# Patient Record
Sex: Female | Born: 1940
Health system: Southern US, Community
[De-identification: ages and names within clinical notes are randomized; demographics above are authoritative.]

## PROBLEM LIST (undated history)

## (undated) DIAGNOSIS — H409 Unspecified glaucoma: Secondary | ICD-10-CM

## (undated) DIAGNOSIS — C801 Malignant (primary) neoplasm, unspecified: Secondary | ICD-10-CM

## (undated) DIAGNOSIS — J479 Bronchiectasis, uncomplicated: Secondary | ICD-10-CM

## (undated) HISTORY — PX: BLADDER REPAIR: SHX76

---

## 1998-04-28 ENCOUNTER — Other Ambulatory Visit: Admission: RE | Admit: 1998-04-28 | Discharge: 1998-04-28 | Payer: Self-pay | Admitting: Obstetrics and Gynecology

## 1998-06-30 ENCOUNTER — Ambulatory Visit (HOSPITAL_COMMUNITY): Admission: RE | Admit: 1998-06-30 | Discharge: 1998-06-30 | Payer: Self-pay | Admitting: Obstetrics and Gynecology

## 1998-07-29 ENCOUNTER — Other Ambulatory Visit: Admission: RE | Admit: 1998-07-29 | Discharge: 1998-07-29 | Payer: Self-pay | Admitting: Obstetrics and Gynecology

## 1999-01-27 ENCOUNTER — Other Ambulatory Visit: Admission: RE | Admit: 1999-01-27 | Discharge: 1999-01-27 | Payer: Self-pay | Admitting: Obstetrics and Gynecology

## 1999-03-11 ENCOUNTER — Encounter: Admission: RE | Admit: 1999-03-11 | Discharge: 1999-03-11 | Payer: Self-pay | Admitting: Sports Medicine

## 1999-03-22 ENCOUNTER — Encounter: Admission: RE | Admit: 1999-03-22 | Discharge: 1999-03-22 | Payer: Self-pay | Admitting: Family Medicine

## 1999-04-06 ENCOUNTER — Encounter: Admission: RE | Admit: 1999-04-06 | Discharge: 1999-04-06 | Payer: Self-pay | Admitting: Sports Medicine

## 1999-05-05 ENCOUNTER — Other Ambulatory Visit: Admission: RE | Admit: 1999-05-05 | Discharge: 1999-05-05 | Payer: Self-pay | Admitting: Obstetrics and Gynecology

## 1999-09-27 ENCOUNTER — Observation Stay (HOSPITAL_COMMUNITY): Admission: RE | Admit: 1999-09-27 | Discharge: 1999-09-28 | Payer: Self-pay | Admitting: Urology

## 1999-10-11 ENCOUNTER — Encounter: Payer: Self-pay | Admitting: Obstetrics and Gynecology

## 1999-10-11 ENCOUNTER — Ambulatory Visit (HOSPITAL_COMMUNITY): Admission: RE | Admit: 1999-10-11 | Discharge: 1999-10-11 | Payer: Self-pay | Admitting: Obstetrics and Gynecology

## 1999-11-19 ENCOUNTER — Encounter: Admission: RE | Admit: 1999-11-19 | Discharge: 1999-11-19 | Payer: Self-pay | Admitting: Family Medicine

## 2000-11-10 ENCOUNTER — Ambulatory Visit (HOSPITAL_COMMUNITY): Admission: RE | Admit: 2000-11-10 | Discharge: 2000-11-10 | Payer: Self-pay | Admitting: Obstetrics and Gynecology

## 2000-11-10 ENCOUNTER — Encounter: Payer: Self-pay | Admitting: Obstetrics and Gynecology

## 2001-05-10 ENCOUNTER — Other Ambulatory Visit: Admission: RE | Admit: 2001-05-10 | Discharge: 2001-05-10 | Payer: Self-pay | Admitting: Obstetrics and Gynecology

## 2001-07-16 ENCOUNTER — Encounter: Admission: RE | Admit: 2001-07-16 | Discharge: 2001-07-16 | Payer: Self-pay | Admitting: Family Medicine

## 2001-12-31 ENCOUNTER — Ambulatory Visit (HOSPITAL_COMMUNITY): Admission: RE | Admit: 2001-12-31 | Discharge: 2001-12-31 | Payer: Self-pay | Admitting: Obstetrics and Gynecology

## 2001-12-31 ENCOUNTER — Encounter: Payer: Self-pay | Admitting: Obstetrics and Gynecology

## 2002-05-13 ENCOUNTER — Encounter (INDEPENDENT_AMBULATORY_CARE_PROVIDER_SITE_OTHER): Payer: Self-pay | Admitting: *Deleted

## 2002-05-22 ENCOUNTER — Other Ambulatory Visit: Admission: RE | Admit: 2002-05-22 | Discharge: 2002-05-22 | Payer: Self-pay | Admitting: Obstetrics and Gynecology

## 2002-06-25 ENCOUNTER — Encounter: Admission: RE | Admit: 2002-06-25 | Discharge: 2002-06-25 | Payer: Self-pay | Admitting: Family Medicine

## 2002-11-22 ENCOUNTER — Encounter: Admission: RE | Admit: 2002-11-22 | Discharge: 2002-11-22 | Payer: Self-pay | Admitting: Family Medicine

## 2002-12-24 ENCOUNTER — Encounter: Admission: RE | Admit: 2002-12-24 | Discharge: 2002-12-24 | Payer: Self-pay | Admitting: Sports Medicine

## 2003-02-20 ENCOUNTER — Encounter: Payer: Self-pay | Admitting: Obstetrics and Gynecology

## 2003-02-20 ENCOUNTER — Ambulatory Visit (HOSPITAL_COMMUNITY): Admission: RE | Admit: 2003-02-20 | Discharge: 2003-02-20 | Payer: Self-pay | Admitting: Obstetrics and Gynecology

## 2003-05-09 ENCOUNTER — Encounter: Admission: RE | Admit: 2003-05-09 | Discharge: 2003-05-09 | Payer: Self-pay | Admitting: Family Medicine

## 2003-05-22 ENCOUNTER — Encounter: Admission: RE | Admit: 2003-05-22 | Discharge: 2003-05-22 | Payer: Self-pay | Admitting: Sports Medicine

## 2003-07-17 ENCOUNTER — Other Ambulatory Visit: Admission: RE | Admit: 2003-07-17 | Discharge: 2003-07-17 | Payer: Self-pay | Admitting: Obstetrics and Gynecology

## 2004-01-16 ENCOUNTER — Encounter: Admission: RE | Admit: 2004-01-16 | Discharge: 2004-01-16 | Payer: Self-pay | Admitting: Family Medicine

## 2004-01-22 ENCOUNTER — Encounter: Admission: RE | Admit: 2004-01-22 | Discharge: 2004-01-22 | Payer: Self-pay | Admitting: Sports Medicine

## 2004-02-10 ENCOUNTER — Ambulatory Visit (HOSPITAL_COMMUNITY): Admission: RE | Admit: 2004-02-10 | Discharge: 2004-02-10 | Payer: Self-pay | Admitting: Vascular Surgery

## 2004-02-10 ENCOUNTER — Encounter: Admission: RE | Admit: 2004-02-10 | Discharge: 2004-02-10 | Payer: Self-pay | Admitting: Sports Medicine

## 2004-02-23 ENCOUNTER — Ambulatory Visit (HOSPITAL_COMMUNITY): Admission: RE | Admit: 2004-02-23 | Discharge: 2004-02-23 | Payer: Self-pay | Admitting: Obstetrics and Gynecology

## 2004-04-29 ENCOUNTER — Encounter: Admission: RE | Admit: 2004-04-29 | Discharge: 2004-04-29 | Payer: Self-pay | Admitting: Sports Medicine

## 2004-05-05 ENCOUNTER — Encounter: Admission: RE | Admit: 2004-05-05 | Discharge: 2004-05-13 | Payer: Self-pay | Admitting: Sports Medicine

## 2004-08-25 ENCOUNTER — Encounter
Admission: RE | Admit: 2004-08-25 | Discharge: 2004-09-27 | Payer: Self-pay | Admitting: Physical Medicine & Rehabilitation

## 2005-03-17 ENCOUNTER — Ambulatory Visit: Payer: Self-pay | Admitting: Internal Medicine

## 2005-03-31 ENCOUNTER — Ambulatory Visit (HOSPITAL_COMMUNITY): Admission: RE | Admit: 2005-03-31 | Discharge: 2005-03-31 | Payer: Self-pay | Admitting: Sports Medicine

## 2005-04-04 ENCOUNTER — Ambulatory Visit: Payer: Self-pay | Admitting: Internal Medicine

## 2005-07-11 ENCOUNTER — Ambulatory Visit: Payer: Self-pay | Admitting: Sports Medicine

## 2005-07-15 ENCOUNTER — Ambulatory Visit: Payer: Self-pay | Admitting: Family Medicine

## 2006-03-20 ENCOUNTER — Encounter: Payer: Self-pay | Admitting: Sports Medicine

## 2006-04-05 ENCOUNTER — Ambulatory Visit (HOSPITAL_COMMUNITY): Admission: RE | Admit: 2006-04-05 | Discharge: 2006-04-05 | Payer: Self-pay | Admitting: Obstetrics and Gynecology

## 2006-07-11 ENCOUNTER — Ambulatory Visit: Payer: Self-pay | Admitting: Sports Medicine

## 2006-07-13 ENCOUNTER — Ambulatory Visit: Payer: Self-pay | Admitting: Internal Medicine

## 2006-08-24 ENCOUNTER — Ambulatory Visit: Payer: Self-pay | Admitting: Internal Medicine

## 2006-11-09 DIAGNOSIS — M26609 Unspecified temporomandibular joint disorder, unspecified side: Secondary | ICD-10-CM | POA: Insufficient documentation

## 2006-11-09 DIAGNOSIS — M5430 Sciatica, unspecified side: Secondary | ICD-10-CM

## 2006-11-10 ENCOUNTER — Encounter (INDEPENDENT_AMBULATORY_CARE_PROVIDER_SITE_OTHER): Payer: Self-pay | Admitting: *Deleted

## 2006-11-27 ENCOUNTER — Ambulatory Visit: Payer: Self-pay | Admitting: Internal Medicine

## 2007-07-24 ENCOUNTER — Encounter: Payer: Self-pay | Admitting: Sports Medicine

## 2007-07-24 ENCOUNTER — Ambulatory Visit: Payer: Self-pay | Admitting: Family Medicine

## 2007-07-27 LAB — CONVERTED CEMR LAB
Albumin: 4.3 g/dL (ref 3.5–5.2)
BUN: 11 mg/dL (ref 6–23)
CO2: 24 meq/L (ref 19–32)
HCT: 42.4 % (ref 36.0–46.0)
LDL Cholesterol: 98 mg/dL (ref 0–99)
MCV: 93.4 fL (ref 78.0–100.0)
Platelets: 234 10*3/uL (ref 150–400)
Potassium: 4.5 meq/L (ref 3.5–5.3)
RBC: 4.54 M/uL (ref 3.87–5.11)
RDW: 13.2 % (ref 11.5–15.5)
TSH: 0.933 microintl units/mL (ref 0.350–5.50)
Total CHOL/HDL Ratio: 3
WBC: 5.7 10*3/uL (ref 4.0–10.5)

## 2007-08-08 ENCOUNTER — Encounter: Payer: Self-pay | Admitting: Sports Medicine

## 2007-08-17 ENCOUNTER — Ambulatory Visit: Payer: Self-pay | Admitting: Internal Medicine

## 2007-08-17 ENCOUNTER — Ambulatory Visit: Payer: Self-pay | Admitting: Pulmonary Disease

## 2007-08-21 ENCOUNTER — Telehealth (INDEPENDENT_AMBULATORY_CARE_PROVIDER_SITE_OTHER): Payer: Self-pay | Admitting: *Deleted

## 2007-08-30 ENCOUNTER — Ambulatory Visit: Payer: Self-pay | Admitting: Sports Medicine

## 2007-08-30 DIAGNOSIS — J301 Allergic rhinitis due to pollen: Secondary | ICD-10-CM | POA: Insufficient documentation

## 2007-08-30 DIAGNOSIS — H402290 Chronic angle-closure glaucoma, unspecified eye, stage unspecified: Secondary | ICD-10-CM | POA: Insufficient documentation

## 2007-08-30 DIAGNOSIS — J189 Pneumonia, unspecified organism: Secondary | ICD-10-CM | POA: Insufficient documentation

## 2007-09-03 ENCOUNTER — Telehealth: Payer: Self-pay | Admitting: *Deleted

## 2007-09-03 ENCOUNTER — Ambulatory Visit: Payer: Self-pay | Admitting: Sports Medicine

## 2007-09-11 ENCOUNTER — Ambulatory Visit (HOSPITAL_COMMUNITY): Admission: RE | Admit: 2007-09-11 | Discharge: 2007-09-11 | Payer: Self-pay | Admitting: Sports Medicine

## 2007-09-13 HISTORY — PX: BREAST LUMPECTOMY: SHX2

## 2007-09-26 ENCOUNTER — Encounter: Admission: RE | Admit: 2007-09-26 | Discharge: 2007-09-26 | Payer: Self-pay | Admitting: Sports Medicine

## 2007-09-26 ENCOUNTER — Encounter (INDEPENDENT_AMBULATORY_CARE_PROVIDER_SITE_OTHER): Payer: Self-pay | Admitting: Diagnostic Radiology

## 2007-09-27 ENCOUNTER — Telehealth: Payer: Self-pay | Admitting: Sports Medicine

## 2008-03-19 ENCOUNTER — Encounter: Payer: Self-pay | Admitting: Sports Medicine

## 2008-07-01 ENCOUNTER — Encounter: Payer: Self-pay | Admitting: Sports Medicine

## 2008-09-30 ENCOUNTER — Encounter: Payer: Self-pay | Admitting: Family Medicine

## 2009-03-31 ENCOUNTER — Encounter: Payer: Self-pay | Admitting: Family Medicine

## 2009-07-01 ENCOUNTER — Encounter: Payer: Self-pay | Admitting: Family Medicine

## 2010-10-03 ENCOUNTER — Encounter: Payer: Self-pay | Admitting: Sports Medicine

## 2010-11-03 ENCOUNTER — Encounter: Payer: Self-pay | Admitting: *Deleted

## 2011-01-25 NOTE — Assessment & Plan Note (Signed)
[facility]                             PULMONARY OFFICE NOTE   NAME:TWISELTONMirella, [name] Rice                     MRN:          [id number]  DATE:08/17/2007                            DOB:          28-Mar-1941    HISTORY OF PRESENT ILLNESS:  This is a 70 year old white female patient  of Dr. Gustavus Bryant who has previously been seen for recurrent chest  infections in the past.  The patient reports she had been doing  exceptionally well up until the last month.  The patient had recently  been on a cruise and had the norovirus.  The patient states symptoms did  resolve.  However, she left for [place] the week before Thanksgiving  and developed significant cough, congestion with thick green mucous,  severe fever, and rigors.  The patient treated herself with some Tylenol  and aspirin products.  On return on August 08, 2007, she saw Dr.  Donneta Romberg.  She was given a course of Levaquin, prednisone Dosepak, started  on Symbicort, and nasal spray.  The patient reports that symptoms have  improved.  However, she continues to have some postnasal drip symptoms  and feels extremely fatigued.  The patient reports her cough is  substantially improved.  She denies any fever, purulent sputum,  hemoptysis, orthopnea, PND, chest pain, calf pain or swelling or edema.   PAST MEDICAL HISTORY:  Reviewed.   CURRENT MEDICATIONS:  Reviewed.   PHYSICAL EXAMINATION:  GENERAL:  The patient is a pleasant female in no  acute distress.  VITAL SIGNS:  She is afebrile with stable vital signs.  O2 saturation is  97% on room air.  HEENT:  Nasal mucosa is slightly pale.  Nontender sinuses.  Posterior  pharynx is clear.  NECK:  Supple without cervical adenopathy.  No JVD.  LUNGS:  Lung sounds are clear to auscultation bilaterally without any  wheezing or crackles.  CARDIOVASCULAR:  S1 and S2 without murmur or gallop.  ABDOMEN:  Soft and nontender.  No palpable hepatosplenomegaly.  EXTREMITIES:   Warm without any clubbing, cyanosis, or edema.  Negative  Homans sign.  SKIN:  Warm without rash.   IMPRESSION AND PLAN:  Recent tracheobronchitis versus possible  pneumonia.  What the patient describes is severe bronchitic symptoms.  The patient has now finished a course of Levaquin which would have  covered a possible underlying pneumonia.  She is to continue on  Symbicort as recommended.  If cough returns, she may use Mucinex DM  twice daily.  I have recommended that she use some saline nasal spray  and continue on Allegra daily.  Chest x-ray is pending at time of  discharge.  We will followup accordingly.  The patient is to follow back  up  with Dr. Melvyn Novas as scheduled or sooner if needed.  The patient is  recommended to increase her fluid intake and Tylenol as needed.      Rexene Edison, NP  Electronically Signed      [name], MD, [facility]  Electronically Signed   TP/MedQ  DD: 08/17/2007  DT: 08/17/2007  Job #: [id number]

## 2011-01-28 NOTE — Assessment & Plan Note (Signed)
[facility]                             PULMONARY OFFICE NOTE   NAME:TWISELTONMakendra, Vigeant                       MRN:          [id number]  DATE:11/27/2006                            DOB:          09/15/1940    HISTORY:  A 70 year old white female who does not know why she is  here. The records indicate she has a history of recurrent chest  infections several times a year with evidence of multiple pulmonary  nodules on CT scan initially suggesting the possibility of atypical  mycobacterial infection. She has had no symptoms at all since her  previous visit, specifically denying any significant dyspnea or cough or  chest pain, fevers, chills, sweats, unintended weight loss.   On physical examination, she is pleasant ambulatory white female with a  delightful [place] accent, no acute distress. She is afebrile, stable  vital signs.  HEENT: Unremarkable. Pharynx clear.  NECK: Supple without cervical adenopathy, tenderness, trachea is  midline, no thyromegaly.  LUNG FIELDS: Completely clear bilaterally to auscultation and  percussion.  CARDIOVASCULAR:  Regular rate and rhythm without murmur, gallop or rub.  ABDOMEN: Soft, benign.  EXTREMITIES: Warm without calf tenderness, cyanosis, clubbing, or edema.   Chest x-ray shows no recurrent nodular changes. PFTs show minum airflow  obstruction, with an FEV 1 of 112%, no improvement after bronchodilators  in a normal diffusing capacity.   IMPRESSION:  No evidence of significant pulmonary disease at present. I  have not solved the puzzle of why she gets so many chest infections  but note the absence of any definite evidence of bronchiectasis or for  that matter significant structural lung disease (other than the nodular  changes in the right mid and upper lung zone see on plan film as well as  chest CT scan on March 20, 2006 which apparently resolved and mostly  likely were inflammatory therefore benign in  nature).   Pulmonary follow up in this setting can be p.r.n.     [name], MD, [facility]  Electronically Signed    MBW/MedQ  DD: 11/27/2006  DT: 11/27/2006  Job #: [id number]   cc:   Wolfgang Phoenix. Oneida Alar, M.D.  Tiajuana Amass, MD

## 2011-01-28 NOTE — Assessment & Plan Note (Signed)
 [facility]                             PULMONARY OFFICE NOTE   NAME:TWISELTONMarijke, [name] Rice                     MRN:          [id number]  DATE:08/24/2006                            DOB:          1941/08/29    HISTORY:  The patient is a 70 year old white female seen at Dr. Seward Meth  request for evaluation of vague right upper lobe airspace changes with  nodularity documented in July 2007 by chest x-ray and CT scan.  She  denies any ongoing symptoms presently on taking only Allegra with no  cough, fever, chills, sweats, chest pain. She does notice mild dyspnea  with exertion over the last 18 months with slightly worsening baseline  but otherwise feels fine.   PHYSICAL EXAMINATION:  GENERAL:  She is an ambulatory, pleasant white  female in no acute distress.  VITAL SIGNS:  She is afebrile with normal vital signs. Weight unchanged  at 148.  HEENT:  Unremarkable.  Oropharynx is clear.  CHEST:  Lungs are perfectly clear bilaterally to auscultation and  percussion.  HEART:  Regular rhythm without murmur, gallop or rub.  ABDOMEN:  Soft, benign.  EXTREMITIES:  Warm without calf tenderness, cyanosis or clubbing or  edema.   LABORATORY DATA:  Chest x-ray today reveals no definite abnormalities in  the right upper lobe where previously on plain films she had definite  nodular densities.   IMPRESSION:  Complete resolution of nodular infiltrates which were most  likely inflammatory and, therefore, not likely represent atypical TB,  although this was a concern, nor significant eosinophilic lung disease.   I am concerned about the complaints of chronic dyspnea on exertion and  recurrent pneumonia and, therefore, recommend followup care in three  months with a set of chest x-rays for apples to apples comparison to  previous studies and also a set of PFTs for baseline purposes, but I do  not believe any pulmonary intervention is necessary at this point.     [name], MD, [facility]  Electronically Signed    MBW/MedQ  DD: 08/24/2006  DT: 08/24/2006  Job #: [id number]   cc:   Dr. Gilman Schmidt B. Oneida Alar, M.D.

## 2011-01-28 NOTE — Assessment & Plan Note (Signed)
[facility]                               PULMONARY OFFICE NOTE   NAME:TWISELTONIrisha, Grandmaison                     MRN:          [id number]  DATE:07/13/2006                            DOB:          1941/06/26    PULMONARY CONSULTATION   REASON FOR CONSULTATION:  Abnormal CT scan.   HISTORY:  A 70 year old white female with a history of recurrent pattern of  chest infections for the last 3-4 years typically requiring an antibiotic  and resolving within a few days.  She had developed a persistent cough and  was evaluated by Dr. Orvil Feil with positive allergy testing but a chest x-ray  was done indicating that she had pulmonary nodules in the right upper lobe  and was therefore seen here at Dr. Seward Meth request.   In the meantime, the patient has no symptoms at all.  Specifically, she  denies any significant cough, chest pain, fevers, chills, sweats, or dyspnea  on her present regimen consisting of Pulmicort which she has reduced on her  own down to one puff daily and fexofenadine one daily.  She denies any  pleuritic pain and enjoys normal activities.   PAST MEDICAL HISTORY:  Significant for a diagnosis of mild asthma and  seasonal rhinitis as noted above.   MEDICATION ALLERGIES:  None.   MEDICATIONS:  Include fexofenadine, calcium, Xalatan, and Pulmicort.   SOCIAL HISTORY:  She smoked only as a teenager occasionally.  She denies any  unusual travel, pet, occupational, or hobby exposure.   FAMILY HISTORY:  Is recorded in detail and significant for allergies in her  mother and granddaughter.   REVIEW OF SYSTEMS:  Taken in detail on the work sheet and significant for  the problems outlined above.   PHYSICAL EXAMINATION:  GENERAL:  This is a very pleasant ambulatory white  female in no acute distress.  She had normal vital signs.  HEENT:  Reveals minimal turbinate edema.  No pallor, polyps, or cyanosis.  Oropharynx is clear.  NECK:  Supple without  cervical adenopathy or tenderness.  Trachea is  midline.  LUNG FIELDS:  Perfectly clear bilaterally to auscultation and percussion.  HEART:  Regular rhythm without murmur, gallop or rub.  ABDOMEN:  Soft, benign.  EXTREMITIES:  Warm without calf tenderness, cyanosis, clubbing, edema.   Chest x-ray was reviewed from March 20, 2006, with no prior films available  (apparently there are some x-rays at [facility] that she may have forgotten to  tell [facility] about) and also a CT scan of the chest dated March 23, 2006, showing vague peripheral nodular opacities predominantly in the right  mid lung zone.   IMPRESSION:  Multiple pulmonary nodules in a patient who has a recurrent  pattern of pneumonia suggests the possibility of MAI infection but  certainly are not consistent with malignancy or MTB.  Although I asked the  question several different ways, I was not actually able to ascertain if the  patient has any chronic illness at all, but she insists this is an issue of  not any chronic respiratory complaints but intermittent symptoms maybe  once  or twice a year maybe for 1 or 2 weeks.  Therefore, it may well be that  the x-rays have nothing to do with the symptoms (although I note by Dr.  Seward Meth original evaluation the concern was a chronic cough which the  patient now denies).   To get a better feel for this, therefore, I recommended the patient return  here in 6 weeks for a chest x-ray and supply at that time any old x-rays she  can find for apples-to-apples comparison and pay more attention to  respiratory symptoms in the meantime to see whether or not we can put  together the x-ray with the symptom complex and justify an intervention such  as transbronchial biopsy.    ______________________________  Christena Deem Melvyn Novas, MD, [facility]    MBW/MedQ  DD: 07/13/2006  DT: 07/13/2006  Job #: [id number]   cc:   Tiajuana Amass, M.D.  Wolfgang Phoenix. Oneida Alar, M.D.

## 2011-10-11 DIAGNOSIS — H409 Unspecified glaucoma: Secondary | ICD-10-CM | POA: Diagnosis not present

## 2011-10-11 DIAGNOSIS — H4011X0 Primary open-angle glaucoma, stage unspecified: Secondary | ICD-10-CM | POA: Diagnosis not present

## 2011-11-18 DIAGNOSIS — Z09 Encounter for follow-up examination after completed treatment for conditions other than malignant neoplasm: Secondary | ICD-10-CM | POA: Diagnosis not present

## 2011-11-18 DIAGNOSIS — Z853 Personal history of malignant neoplasm of breast: Secondary | ICD-10-CM | POA: Diagnosis not present

## 2011-11-18 DIAGNOSIS — C50919 Malignant neoplasm of unspecified site of unspecified female breast: Secondary | ICD-10-CM | POA: Diagnosis not present

## 2011-11-18 DIAGNOSIS — Z9889 Other specified postprocedural states: Secondary | ICD-10-CM | POA: Diagnosis not present

## 2011-11-30 DIAGNOSIS — R87610 Atypical squamous cells of undetermined significance on cytologic smear of cervix (ASC-US): Secondary | ICD-10-CM | POA: Diagnosis not present

## 2011-11-30 DIAGNOSIS — Z124 Encounter for screening for malignant neoplasm of cervix: Secondary | ICD-10-CM | POA: Diagnosis not present

## 2012-01-03 DIAGNOSIS — H4011X0 Primary open-angle glaucoma, stage unspecified: Secondary | ICD-10-CM | POA: Diagnosis not present

## 2012-01-03 DIAGNOSIS — H409 Unspecified glaucoma: Secondary | ICD-10-CM | POA: Diagnosis not present

## 2012-04-03 DIAGNOSIS — H4011X0 Primary open-angle glaucoma, stage unspecified: Secondary | ICD-10-CM | POA: Diagnosis not present

## 2012-04-03 DIAGNOSIS — H269 Unspecified cataract: Secondary | ICD-10-CM | POA: Diagnosis not present

## 2012-04-03 DIAGNOSIS — H409 Unspecified glaucoma: Secondary | ICD-10-CM | POA: Diagnosis not present

## 2012-04-13 DIAGNOSIS — C50919 Malignant neoplasm of unspecified site of unspecified female breast: Secondary | ICD-10-CM | POA: Diagnosis not present

## 2012-06-27 DIAGNOSIS — Z23 Encounter for immunization: Secondary | ICD-10-CM | POA: Diagnosis not present

## 2012-07-10 DIAGNOSIS — H409 Unspecified glaucoma: Secondary | ICD-10-CM | POA: Diagnosis not present

## 2012-07-10 DIAGNOSIS — H4011X0 Primary open-angle glaucoma, stage unspecified: Secondary | ICD-10-CM | POA: Diagnosis not present

## 2012-07-10 DIAGNOSIS — H259 Unspecified age-related cataract: Secondary | ICD-10-CM | POA: Diagnosis not present

## 2012-07-24 DIAGNOSIS — Z136 Encounter for screening for cardiovascular disorders: Secondary | ICD-10-CM | POA: Diagnosis not present

## 2012-07-24 DIAGNOSIS — Z1331 Encounter for screening for depression: Secondary | ICD-10-CM | POA: Diagnosis not present

## 2012-07-24 DIAGNOSIS — Z131 Encounter for screening for diabetes mellitus: Secondary | ICD-10-CM | POA: Diagnosis not present

## 2012-07-24 DIAGNOSIS — Z0000 Encounter for general adult medical examination without abnormal findings: Secondary | ICD-10-CM | POA: Diagnosis not present

## 2012-10-09 DIAGNOSIS — H4011X0 Primary open-angle glaucoma, stage unspecified: Secondary | ICD-10-CM | POA: Diagnosis not present

## 2012-10-09 DIAGNOSIS — H409 Unspecified glaucoma: Secondary | ICD-10-CM | POA: Diagnosis not present

## 2012-10-09 DIAGNOSIS — H259 Unspecified age-related cataract: Secondary | ICD-10-CM | POA: Diagnosis not present

## 2012-11-22 DIAGNOSIS — C50919 Malignant neoplasm of unspecified site of unspecified female breast: Secondary | ICD-10-CM | POA: Diagnosis not present

## 2013-01-01 DIAGNOSIS — H259 Unspecified age-related cataract: Secondary | ICD-10-CM | POA: Diagnosis not present

## 2013-01-01 DIAGNOSIS — H4011X0 Primary open-angle glaucoma, stage unspecified: Secondary | ICD-10-CM | POA: Diagnosis not present

## 2013-01-01 DIAGNOSIS — H409 Unspecified glaucoma: Secondary | ICD-10-CM | POA: Diagnosis not present

## 2013-01-20 DIAGNOSIS — J479 Bronchiectasis, uncomplicated: Secondary | ICD-10-CM | POA: Diagnosis not present

## 2013-04-02 DIAGNOSIS — H409 Unspecified glaucoma: Secondary | ICD-10-CM | POA: Diagnosis not present

## 2013-04-02 DIAGNOSIS — H4011X0 Primary open-angle glaucoma, stage unspecified: Secondary | ICD-10-CM | POA: Diagnosis not present

## 2013-04-02 DIAGNOSIS — H259 Unspecified age-related cataract: Secondary | ICD-10-CM | POA: Diagnosis not present

## 2013-04-19 DIAGNOSIS — C50919 Malignant neoplasm of unspecified site of unspecified female breast: Secondary | ICD-10-CM | POA: Diagnosis not present

## 2013-05-01 DIAGNOSIS — R059 Cough, unspecified: Secondary | ICD-10-CM | POA: Diagnosis not present

## 2013-05-01 DIAGNOSIS — C50919 Malignant neoplasm of unspecified site of unspecified female breast: Secondary | ICD-10-CM | POA: Diagnosis not present

## 2013-05-01 DIAGNOSIS — R042 Hemoptysis: Secondary | ICD-10-CM | POA: Diagnosis not present

## 2013-05-01 DIAGNOSIS — R05 Cough: Secondary | ICD-10-CM | POA: Diagnosis not present

## 2013-05-01 DIAGNOSIS — J479 Bronchiectasis, uncomplicated: Secondary | ICD-10-CM | POA: Diagnosis not present

## 2013-07-01 DIAGNOSIS — Z23 Encounter for immunization: Secondary | ICD-10-CM | POA: Diagnosis not present

## 2013-07-16 DIAGNOSIS — H259 Unspecified age-related cataract: Secondary | ICD-10-CM | POA: Diagnosis not present

## 2013-07-16 DIAGNOSIS — H4011X0 Primary open-angle glaucoma, stage unspecified: Secondary | ICD-10-CM | POA: Diagnosis not present

## 2013-07-16 DIAGNOSIS — H409 Unspecified glaucoma: Secondary | ICD-10-CM | POA: Diagnosis not present

## 2013-07-29 DIAGNOSIS — R042 Hemoptysis: Secondary | ICD-10-CM | POA: Diagnosis not present

## 2013-07-29 DIAGNOSIS — Z1331 Encounter for screening for depression: Secondary | ICD-10-CM | POA: Diagnosis not present

## 2013-08-07 DIAGNOSIS — L723 Sebaceous cyst: Secondary | ICD-10-CM | POA: Diagnosis not present

## 2013-08-28 DIAGNOSIS — M19049 Primary osteoarthritis, unspecified hand: Secondary | ICD-10-CM | POA: Diagnosis not present

## 2013-08-28 DIAGNOSIS — L723 Sebaceous cyst: Secondary | ICD-10-CM | POA: Diagnosis not present

## 2013-08-28 DIAGNOSIS — M6740 Ganglion, unspecified site: Secondary | ICD-10-CM | POA: Diagnosis not present

## 2013-08-30 DIAGNOSIS — Z01818 Encounter for other preprocedural examination: Secondary | ICD-10-CM | POA: Diagnosis not present

## 2013-08-30 DIAGNOSIS — J479 Bronchiectasis, uncomplicated: Secondary | ICD-10-CM | POA: Diagnosis not present

## 2013-09-16 DIAGNOSIS — Z853 Personal history of malignant neoplasm of breast: Secondary | ICD-10-CM | POA: Diagnosis not present

## 2013-09-16 DIAGNOSIS — M6740 Ganglion, unspecified site: Secondary | ICD-10-CM | POA: Diagnosis not present

## 2013-10-01 DIAGNOSIS — H269 Unspecified cataract: Secondary | ICD-10-CM | POA: Diagnosis not present

## 2013-10-01 DIAGNOSIS — H4011X0 Primary open-angle glaucoma, stage unspecified: Secondary | ICD-10-CM | POA: Diagnosis not present

## 2013-10-01 DIAGNOSIS — H409 Unspecified glaucoma: Secondary | ICD-10-CM | POA: Diagnosis not present

## 2013-12-17 DIAGNOSIS — H4011X0 Primary open-angle glaucoma, stage unspecified: Secondary | ICD-10-CM | POA: Diagnosis not present

## 2013-12-17 DIAGNOSIS — H409 Unspecified glaucoma: Secondary | ICD-10-CM | POA: Diagnosis not present

## 2013-12-25 DIAGNOSIS — Z9889 Other specified postprocedural states: Secondary | ICD-10-CM | POA: Diagnosis not present

## 2013-12-25 DIAGNOSIS — C50919 Malignant neoplasm of unspecified site of unspecified female breast: Secondary | ICD-10-CM | POA: Diagnosis not present

## 2013-12-25 DIAGNOSIS — Z853 Personal history of malignant neoplasm of breast: Secondary | ICD-10-CM | POA: Diagnosis not present

## 2014-01-02 DIAGNOSIS — J479 Bronchiectasis, uncomplicated: Secondary | ICD-10-CM | POA: Diagnosis not present

## 2014-02-18 DIAGNOSIS — H4011X0 Primary open-angle glaucoma, stage unspecified: Secondary | ICD-10-CM | POA: Diagnosis not present

## 2014-02-18 DIAGNOSIS — H409 Unspecified glaucoma: Secondary | ICD-10-CM | POA: Diagnosis not present

## 2014-02-18 DIAGNOSIS — H269 Unspecified cataract: Secondary | ICD-10-CM | POA: Diagnosis not present

## 2014-05-08 DIAGNOSIS — Z124 Encounter for screening for malignant neoplasm of cervix: Secondary | ICD-10-CM | POA: Diagnosis not present

## 2014-07-01 DIAGNOSIS — Z23 Encounter for immunization: Secondary | ICD-10-CM | POA: Diagnosis not present

## 2014-07-22 DIAGNOSIS — H4011X2 Primary open-angle glaucoma, moderate stage: Secondary | ICD-10-CM | POA: Diagnosis not present

## 2014-07-22 DIAGNOSIS — H4011X3 Primary open-angle glaucoma, severe stage: Secondary | ICD-10-CM | POA: Diagnosis not present

## 2014-08-11 DIAGNOSIS — R300 Dysuria: Secondary | ICD-10-CM | POA: Diagnosis not present

## 2014-08-11 DIAGNOSIS — N390 Urinary tract infection, site not specified: Secondary | ICD-10-CM | POA: Diagnosis not present

## 2014-09-26 DIAGNOSIS — Z23 Encounter for immunization: Secondary | ICD-10-CM | POA: Diagnosis not present

## 2014-09-26 DIAGNOSIS — Z1389 Encounter for screening for other disorder: Secondary | ICD-10-CM | POA: Diagnosis not present

## 2014-09-26 DIAGNOSIS — Z0000 Encounter for general adult medical examination without abnormal findings: Secondary | ICD-10-CM | POA: Diagnosis not present

## 2014-10-14 DIAGNOSIS — H2513 Age-related nuclear cataract, bilateral: Secondary | ICD-10-CM | POA: Diagnosis not present

## 2014-10-14 DIAGNOSIS — H4011X2 Primary open-angle glaucoma, moderate stage: Secondary | ICD-10-CM | POA: Diagnosis not present

## 2014-10-14 DIAGNOSIS — H4011X3 Primary open-angle glaucoma, severe stage: Secondary | ICD-10-CM | POA: Diagnosis not present

## 2014-12-11 DIAGNOSIS — J479 Bronchiectasis, uncomplicated: Secondary | ICD-10-CM | POA: Diagnosis not present

## 2015-01-08 ENCOUNTER — Encounter: Payer: Self-pay | Admitting: Internal Medicine

## 2015-01-09 ENCOUNTER — Encounter: Payer: Self-pay | Admitting: Internal Medicine

## 2015-01-20 DIAGNOSIS — H4011X2 Primary open-angle glaucoma, moderate stage: Secondary | ICD-10-CM | POA: Diagnosis not present

## 2015-01-20 DIAGNOSIS — H4011X3 Primary open-angle glaucoma, severe stage: Secondary | ICD-10-CM | POA: Diagnosis not present

## 2015-01-23 DIAGNOSIS — Z1231 Encounter for screening mammogram for malignant neoplasm of breast: Secondary | ICD-10-CM | POA: Diagnosis not present

## 2015-01-23 DIAGNOSIS — C50312 Malignant neoplasm of lower-inner quadrant of left female breast: Secondary | ICD-10-CM | POA: Diagnosis not present

## 2015-01-23 DIAGNOSIS — C50919 Malignant neoplasm of unspecified site of unspecified female breast: Secondary | ICD-10-CM | POA: Diagnosis not present

## 2015-02-03 DIAGNOSIS — H4011X3 Primary open-angle glaucoma, severe stage: Secondary | ICD-10-CM | POA: Diagnosis not present

## 2015-02-03 DIAGNOSIS — H4011X2 Primary open-angle glaucoma, moderate stage: Secondary | ICD-10-CM | POA: Diagnosis not present

## 2015-02-03 DIAGNOSIS — H2513 Age-related nuclear cataract, bilateral: Secondary | ICD-10-CM | POA: Diagnosis not present

## 2015-04-07 DIAGNOSIS — H4011X3 Primary open-angle glaucoma, severe stage: Secondary | ICD-10-CM | POA: Diagnosis not present

## 2015-04-07 DIAGNOSIS — H2513 Age-related nuclear cataract, bilateral: Secondary | ICD-10-CM | POA: Diagnosis not present

## 2015-04-07 DIAGNOSIS — H4011X2 Primary open-angle glaucoma, moderate stage: Secondary | ICD-10-CM | POA: Diagnosis not present

## 2015-05-04 DIAGNOSIS — L237 Allergic contact dermatitis due to plants, except food: Secondary | ICD-10-CM | POA: Diagnosis not present

## 2015-06-24 DIAGNOSIS — Z23 Encounter for immunization: Secondary | ICD-10-CM | POA: Diagnosis not present

## 2015-07-06 DIAGNOSIS — L237 Allergic contact dermatitis due to plants, except food: Secondary | ICD-10-CM | POA: Diagnosis not present

## 2015-07-09 DIAGNOSIS — Z01419 Encounter for gynecological examination (general) (routine) without abnormal findings: Secondary | ICD-10-CM | POA: Diagnosis not present

## 2015-07-09 DIAGNOSIS — Z124 Encounter for screening for malignant neoplasm of cervix: Secondary | ICD-10-CM | POA: Diagnosis not present

## 2015-07-14 DIAGNOSIS — H401123 Primary open-angle glaucoma, left eye, severe stage: Secondary | ICD-10-CM | POA: Diagnosis not present

## 2015-07-14 DIAGNOSIS — H401112 Primary open-angle glaucoma, right eye, moderate stage: Secondary | ICD-10-CM | POA: Diagnosis not present

## 2015-09-28 DIAGNOSIS — I499 Cardiac arrhythmia, unspecified: Secondary | ICD-10-CM | POA: Diagnosis not present

## 2015-09-28 DIAGNOSIS — Z136 Encounter for screening for cardiovascular disorders: Secondary | ICD-10-CM | POA: Diagnosis not present

## 2015-09-28 DIAGNOSIS — Z0000 Encounter for general adult medical examination without abnormal findings: Secondary | ICD-10-CM | POA: Diagnosis not present

## 2015-09-28 DIAGNOSIS — Z13820 Encounter for screening for osteoporosis: Secondary | ICD-10-CM | POA: Diagnosis not present

## 2015-09-28 DIAGNOSIS — Z1389 Encounter for screening for other disorder: Secondary | ICD-10-CM | POA: Diagnosis not present

## 2015-09-28 DIAGNOSIS — R030 Elevated blood-pressure reading, without diagnosis of hypertension: Secondary | ICD-10-CM | POA: Diagnosis not present

## 2015-10-07 DIAGNOSIS — Z780 Asymptomatic menopausal state: Secondary | ICD-10-CM | POA: Diagnosis not present

## 2015-10-13 DIAGNOSIS — H401112 Primary open-angle glaucoma, right eye, moderate stage: Secondary | ICD-10-CM | POA: Diagnosis not present

## 2015-10-13 DIAGNOSIS — H401123 Primary open-angle glaucoma, left eye, severe stage: Secondary | ICD-10-CM | POA: Diagnosis not present

## 2015-10-29 DIAGNOSIS — J479 Bronchiectasis, uncomplicated: Secondary | ICD-10-CM | POA: Diagnosis not present

## 2015-10-29 DIAGNOSIS — C50312 Malignant neoplasm of lower-inner quadrant of left female breast: Secondary | ICD-10-CM | POA: Diagnosis not present

## 2015-12-16 DIAGNOSIS — H2513 Age-related nuclear cataract, bilateral: Secondary | ICD-10-CM | POA: Diagnosis not present

## 2015-12-16 DIAGNOSIS — H401123 Primary open-angle glaucoma, left eye, severe stage: Secondary | ICD-10-CM | POA: Diagnosis not present

## 2015-12-16 DIAGNOSIS — H1012 Acute atopic conjunctivitis, left eye: Secondary | ICD-10-CM | POA: Diagnosis not present

## 2015-12-16 DIAGNOSIS — H401112 Primary open-angle glaucoma, right eye, moderate stage: Secondary | ICD-10-CM | POA: Diagnosis not present

## 2015-12-28 DIAGNOSIS — R030 Elevated blood-pressure reading, without diagnosis of hypertension: Secondary | ICD-10-CM | POA: Diagnosis not present

## 2015-12-28 DIAGNOSIS — I491 Atrial premature depolarization: Secondary | ICD-10-CM | POA: Diagnosis not present

## 2016-01-12 DIAGNOSIS — H401112 Primary open-angle glaucoma, right eye, moderate stage: Secondary | ICD-10-CM | POA: Diagnosis not present

## 2016-01-12 DIAGNOSIS — H401123 Primary open-angle glaucoma, left eye, severe stage: Secondary | ICD-10-CM | POA: Diagnosis not present

## 2016-01-12 DIAGNOSIS — H2513 Age-related nuclear cataract, bilateral: Secondary | ICD-10-CM | POA: Diagnosis not present

## 2016-01-29 DIAGNOSIS — C50312 Malignant neoplasm of lower-inner quadrant of left female breast: Secondary | ICD-10-CM | POA: Diagnosis not present

## 2016-06-23 DIAGNOSIS — Z23 Encounter for immunization: Secondary | ICD-10-CM | POA: Diagnosis not present

## 2016-07-12 DIAGNOSIS — H401112 Primary open-angle glaucoma, right eye, moderate stage: Secondary | ICD-10-CM | POA: Diagnosis not present

## 2016-07-12 DIAGNOSIS — H401123 Primary open-angle glaucoma, left eye, severe stage: Secondary | ICD-10-CM | POA: Diagnosis not present

## 2016-07-12 DIAGNOSIS — H2513 Age-related nuclear cataract, bilateral: Secondary | ICD-10-CM | POA: Diagnosis not present

## 2016-10-12 DIAGNOSIS — Z1389 Encounter for screening for other disorder: Secondary | ICD-10-CM | POA: Diagnosis not present

## 2016-10-12 DIAGNOSIS — Z0000 Encounter for general adult medical examination without abnormal findings: Secondary | ICD-10-CM | POA: Diagnosis not present

## 2016-10-25 DIAGNOSIS — H401112 Primary open-angle glaucoma, right eye, moderate stage: Secondary | ICD-10-CM | POA: Diagnosis not present

## 2016-10-25 DIAGNOSIS — H401123 Primary open-angle glaucoma, left eye, severe stage: Secondary | ICD-10-CM | POA: Diagnosis not present

## 2016-10-26 DIAGNOSIS — J479 Bronchiectasis, uncomplicated: Secondary | ICD-10-CM | POA: Diagnosis not present

## 2016-10-26 DIAGNOSIS — C50312 Malignant neoplasm of lower-inner quadrant of left female breast: Secondary | ICD-10-CM | POA: Diagnosis not present

## 2017-01-24 DIAGNOSIS — H40023 Open angle with borderline findings, high risk, bilateral: Secondary | ICD-10-CM | POA: Diagnosis not present

## 2017-02-03 DIAGNOSIS — C50312 Malignant neoplasm of lower-inner quadrant of left female breast: Secondary | ICD-10-CM | POA: Diagnosis not present

## 2017-02-03 DIAGNOSIS — C50912 Malignant neoplasm of unspecified site of left female breast: Secondary | ICD-10-CM | POA: Diagnosis not present

## 2017-02-09 ENCOUNTER — Other Ambulatory Visit: Payer: Self-pay | Admitting: Hematology and Oncology

## 2017-02-09 DIAGNOSIS — Z853 Personal history of malignant neoplasm of breast: Secondary | ICD-10-CM

## 2017-04-06 DIAGNOSIS — J479 Bronchiectasis, uncomplicated: Secondary | ICD-10-CM | POA: Diagnosis not present

## 2017-04-06 DIAGNOSIS — R0602 Shortness of breath: Secondary | ICD-10-CM | POA: Diagnosis not present

## 2017-04-10 DIAGNOSIS — R918 Other nonspecific abnormal finding of lung field: Secondary | ICD-10-CM | POA: Diagnosis not present

## 2017-04-10 DIAGNOSIS — J479 Bronchiectasis, uncomplicated: Secondary | ICD-10-CM | POA: Diagnosis not present

## 2017-04-10 DIAGNOSIS — R0602 Shortness of breath: Secondary | ICD-10-CM | POA: Diagnosis not present

## 2017-04-25 DIAGNOSIS — H401112 Primary open-angle glaucoma, right eye, moderate stage: Secondary | ICD-10-CM | POA: Diagnosis not present

## 2017-04-25 DIAGNOSIS — H401123 Primary open-angle glaucoma, left eye, severe stage: Secondary | ICD-10-CM | POA: Diagnosis not present

## 2017-06-27 DIAGNOSIS — Z23 Encounter for immunization: Secondary | ICD-10-CM | POA: Diagnosis not present

## 2017-08-01 DIAGNOSIS — H401112 Primary open-angle glaucoma, right eye, moderate stage: Secondary | ICD-10-CM | POA: Diagnosis not present

## 2017-08-01 DIAGNOSIS — H401123 Primary open-angle glaucoma, left eye, severe stage: Secondary | ICD-10-CM | POA: Diagnosis not present

## 2017-08-18 ENCOUNTER — Telehealth: Payer: Self-pay | Admitting: Hematology and Oncology

## 2017-08-18 ENCOUNTER — Encounter: Payer: Self-pay | Admitting: Hematology and Oncology

## 2017-08-18 NOTE — Telephone Encounter (Signed)
Appt has been scheduled for the pt to see Dr. Lindi Adie on 01/22/18 at 1pm. Pt is transferring from [place] because her oncologist is no longer there and she's moved to [place]. Letter mailed to the pt.

## 2017-08-23 ENCOUNTER — Ambulatory Visit: Payer: Self-pay | Admitting: Hematology and Oncology

## 2017-08-24 NOTE — Telephone Encounter (Signed)
Patient called today to confirm May 2019 appointment.

## 2017-08-27 ENCOUNTER — Emergency Department (HOSPITAL_COMMUNITY)
Admission: EM | Admit: 2017-08-27 | Discharge: 2017-08-27 | Disposition: A | Discharge status: Home / Self Care | Payer: Medicare Other | Attending: Emergency Medicine | Admitting: Emergency Medicine

## 2017-08-27 ENCOUNTER — Emergency Department (HOSPITAL_COMMUNITY): Payer: Medicare Other

## 2017-08-27 ENCOUNTER — Encounter (HOSPITAL_COMMUNITY): Payer: Self-pay | Admitting: Emergency Medicine

## 2017-08-27 DIAGNOSIS — R042 Hemoptysis: Secondary | ICD-10-CM | POA: Insufficient documentation

## 2017-08-27 DIAGNOSIS — Z87891 Personal history of nicotine dependence: Secondary | ICD-10-CM | POA: Diagnosis not present

## 2017-08-27 DIAGNOSIS — R42 Dizziness and giddiness: Secondary | ICD-10-CM | POA: Diagnosis not present

## 2017-08-27 DIAGNOSIS — D471 Chronic myeloproliferative disease: Secondary | ICD-10-CM | POA: Diagnosis not present

## 2017-08-27 DIAGNOSIS — R0600 Dyspnea, unspecified: Secondary | ICD-10-CM | POA: Insufficient documentation

## 2017-08-27 DIAGNOSIS — J471 Bronchiectasis with (acute) exacerbation: Secondary | ICD-10-CM | POA: Insufficient documentation

## 2017-08-27 DIAGNOSIS — Z853 Personal history of malignant neoplasm of breast: Secondary | ICD-10-CM | POA: Diagnosis not present

## 2017-08-27 HISTORY — DX: Malignant (primary) neoplasm, unspecified: C80.1

## 2017-08-27 HISTORY — DX: Unspecified glaucoma: H40.9

## 2017-08-27 LAB — COMPREHENSIVE METABOLIC PANEL
ALBUMIN: 4.3 g/dL (ref 3.5–5.0)
ALT: 14 U/L (ref 14–54)
AST: 21 U/L (ref 15–41)
Alkaline Phosphatase: 61 U/L (ref 38–126)
Anion gap: 8 (ref 5–15)
BUN: 11 mg/dL (ref 6–20)
CHLORIDE: 105 mmol/L (ref 101–111)
CO2: 24 mmol/L (ref 22–32)
CREATININE: 0.78 mg/dL (ref 0.44–1.00)
Calcium: 9 mg/dL (ref 8.9–10.3)
GFR calc Af Amer: >60 mL/min (ref >60)
GLUCOSE: 114 mg/dL — AB (ref 65–99)
POTASSIUM: 3.5 mmol/L (ref 3.5–5.1)
Sodium: 137 mmol/L (ref 135–145)
Total Bilirubin: 1.1 mg/dL (ref 0.3–1.2)
Total Protein: 8 g/dL (ref 6.5–8.1)

## 2017-08-27 LAB — CBC WITH DIFFERENTIAL/PLATELET
BASOS ABS: 0.1 10*3/uL (ref 0.0–0.1)
BASOS PCT: 1 %
EOS PCT: 3 %
Eosinophils Absolute: 0.2 10*3/uL (ref 0.0–0.7)
HEMATOCRIT: 39.5 % (ref 36.0–46.0)
Hemoglobin: 13.2 g/dL (ref 12.0–15.0)
LYMPHS PCT: 24 %
Lymphs Abs: 1.8 10*3/uL (ref 0.7–4.0)
MCH: 30.8 pg (ref 26.0–34.0)
MCHC: 33.4 g/dL (ref 30.0–36.0)
MCV: 92.1 fL (ref 78.0–100.0)
Monocytes Absolute: 0.5 10*3/uL (ref 0.1–1.0)
Monocytes Relative: 7 %
NEUTROS ABS: 5 10*3/uL (ref 1.7–7.7)
Neutrophils Relative %: 65 %
PLATELETS: 233 10*3/uL (ref 150–400)
RBC: 4.29 MIL/uL (ref 3.87–5.11)
RDW: 13.5 % (ref 11.5–15.5)
WBC: 7.6 10*3/uL (ref 4.0–10.5)

## 2017-08-27 LAB — I-STAT TROPONIN, ED: TROPONIN I, POC: 0 ng/mL (ref 0.00–0.08)

## 2017-08-27 MED ORDER — IOPAMIDOL (ISOVUE-370) INJECTION 76%
INTRAVENOUS | Status: AC
  Administered 2017-08-27: 100 mL via INTRAVENOUS
  Filled 2017-08-27: qty 100

## 2017-08-27 MED ORDER — AZITHROMYCIN 250 MG PO TABS: 250.0000 mg | ORAL_TABLET | Freq: Every day | ORAL | 0 refills | Status: DC

## 2017-08-27 NOTE — ED Provider Notes (Signed)
[facility] DEPT Provider Note   CSN: [id number] Arrival date & time: 08/27/17  1305     History   Chief Complaint Chief Complaint  Patient presents with  . Hemoptysis    HPI [name] Rice is a 76 y.o. female.  HPI   76yo female with history of breast cancer in remission, glaucoma, and bronchiectasis presents with concern for hemoptysis.   Reports she had episode last night of coughing up bright red blood. Went to sleep than this morning began coughing up blood, some large and some small clots.  Estimates approximately half a cup to 1 cup   Reports some dyspnea since symptoms began.  No chest pain. No leg pain or swelling.  Some lightheadedness.  No palpitations, nausea, or vomiting.  Reports drove back from [place] last week.  No recent surgeries. No hx of DVT or PE. Does have history of hemoptysis for which she was hospitalized in the [place], diagnosed with bronchiectasis.  Sees Pulmolology at [facility], began seeing [facility] as she was having breast cancer treatment there.  Would like to have pulmonary doctor closer to home.     Past Medical History:  Diagnosis Date  . Cancer ([place])    remission- breast  . Glaucoma     Patient Active Problem List   Diagnosis Date Noted  . CHRONIC ANGLE-CLOSURE GLAUCOMA 08/30/2007  . ALLERGIC RHINITIS DUE TO POLLEN 08/30/2007  . PNEUMONIA, ORGANISM UNSPECIFIED 08/30/2007  . TMJ SYNDROME 11/09/2006  . SCIATICA 11/09/2006    Past Surgical History:  Procedure Laterality Date  . BLADDER REPAIR      OB History    No data available       Home Medications    Prior to Admission medications   Medication Sig Start Date End Date Taking? Authorizing Provider  azithromycin (ZITHROMAX) 250 MG tablet Take 1 tablet (250 mg total) by mouth daily. Take first 2 tablets together, then 1 every day until finished. 08/27/17   Gareth Morgan, MD  scopolamine (TRANSDERM-SCOP) 1.5 MG Place 1 patch onto the skin every third day.       [provider]    Family History No family history on file.  Social History Social History   Tobacco Use  . Smoking status: Former Smoker    Types: Cigarettes  . Smokeless tobacco: Never Used  Substance Use Topics  . Alcohol use: Yes  . Drug use: No     Allergies   Bactrim [sulfamethoxazole-trimethoprim]   Review of Systems Review of Systems  Constitutional: Negative for fever.  HENT: Negative for sore throat.   Eyes: Negative for visual disturbance.  Respiratory: Positive for cough (coughed u pblood) and shortness of breath.   Cardiovascular: Negative for chest pain and leg swelling.  Gastrointestinal: Negative for abdominal pain, nausea and vomiting.  Genitourinary: Negative for difficulty urinating.  Musculoskeletal: Negative for back pain and neck pain.  Skin: Negative for rash.  Neurological: Positive for light-headedness. Negative for syncope and headaches.     Physical Exam Updated Vital Signs BP (!) 154/62 (BP Location: Right Arm)   Pulse 82   Temp 98.8 F (37.1 C) (Oral)   Resp 15   Ht _0  (1.549 m)   Wt 59 kg (130 lb)   SpO2 96%   BMI 24.56 kg/m   Physical Exam  Constitutional: She is oriented to person, place, and time. She appears well-developed and well-nourished. No distress.  HENT:  Head: Normocephalic and atraumatic.  Eyes: Conjunctivae and EOM  are normal.  Neck: Normal range of motion.  Cardiovascular: Normal rate, regular rhythm, normal heart sounds and intact distal pulses. Exam reveals no gallop and no friction rub.  No murmur heard. Pulmonary/Chest: Effort normal and breath sounds normal. No respiratory distress. She has no wheezes.  Rhonchi cleared after coughing  Abdominal: Soft. She exhibits no distension. There is no tenderness. There is no guarding.  Musculoskeletal: She exhibits no edema or tenderness.  Neurological: She is alert and oriented to person, place, and time.  Skin: Skin is warm and dry. No rash  noted. She is not diaphoretic. No erythema.  Nursing note and vitals reviewed.    ED Treatments / Results  Labs (all labs ordered are listed, but only abnormal results are displayed) Labs Reviewed  COMPREHENSIVE METABOLIC PANEL - Abnormal; Notable for the following components:      Result Value   Glucose, Bld 114 (*)    All other components within normal limits  CBC WITH DIFFERENTIAL/PLATELET  I-STAT TROPONIN, ED    EKG  EKG Interpretation None       Radiology Dg Chest 2 View  Result Date: 08/27/2017 CLINICAL DATA:  Hemoptysis EXAM: CHEST  2 VIEW COMPARISON:  06/16/2008 CT FINDINGS: Heart is normal size. Hyperinflation of the lungs. Diffuse interstitial prominence throughout the lungs, likely compatible with severe chronic interstitial lung disease. No effusions or acute bony abnormality. IMPRESSION: Hyperinflation of the lungs with diffuse interstitial prominence throughout the lungs, likely severe chronic interstitial lung disease. Electronically Signed   By: Rolm Baptise M.D.   On: 08/27/2017 14:49   Ct Angio Chest Pe W And/or Wo Contrast  Result Date: 08/27/2017 CLINICAL DATA:  Hemoptysis. EXAM: CT ANGIOGRAPHY CHEST WITH CONTRAST TECHNIQUE: Multidetector CT imaging of the chest was performed using the standard protocol during bolus administration of intravenous contrast. Multiplanar CT image reconstructions and MIPs were obtained to evaluate the vascular anatomy. CONTRAST:  176m ISOVUE-370 IOPAMIDOL (ISOVUE-370) INJECTION 76% COMPARISON:  06/16/2008 FINDINGS: Cardiovascular: Satisfactory opacification of the pulmonary arteries to the segmental level. No evidence of pulmonary embolism. Normal heart size. No pericardial effusion. Aortic atherosclerosis. Calcification in the LAD coronary artery. Mediastinum/Nodes: No enlarged mediastinal, hilar, or axillary lymph nodes. Thyroid gland, trachea, and esophagus demonstrate no significant findings. Lungs/Pleura: Bronchiectasis is  identified bilaterally. This is most apparent within the lingula and right middle lobe. Diffuse bilateral pulmonary nodularity is identified which appears peripheral and lower lobe predominant. Many of these pulmonary nodules scratch set most of these pulmonary nodules have a tree-in-bud configuration. Other nodules are nonspecific in appearance such as right lower lobe nodule measuring 7 mm, image 87 of series 6. For subpleural nodule in the posterior left lower lobe measuring 6 mm. Upper Abdomen: No acute abnormality. Musculoskeletal: No chest wall abnormality. No acute or significant osseous findings. Review of the MIP images confirms the above findings. IMPRESSION: 1. No evidence for acute pulmonary embolus. 2. Bilateral bronchiectasis and numerous bilateral, lower lobe and peripheral predominant tree-in-bud nodularity. The appearance is favored to represent sequelae of chronic indolent atypical inflammation/ infection such mycobacterium avium complex. 3. Other nodules identified in both lungs are nonspecific in appearance measuring up to 7 mm. Non-contrast chest CT at 3-6 months is recommended. If the nodules are stable at time of repeat CT, then future CT at 18-24 months (from today's scan) is considered optional for low-risk patients, but is recommended for high-risk patients. This recommendation follows the consensus statement: Guidelines for Management of Incidental Pulmonary Nodules Detected on CT Images: From  the Fleischner Society 2017; Radiology 2017; [phone]. 4. Aortic Atherosclerosis (ICD10-I70.0). Lad coronary artery calcifications noted. Electronically Signed   By: Kerby Moors M.D.   On: 08/27/2017 15:36    Procedures Procedures (including critical care time)  Medications Ordered in ED Medications  iopamidol (ISOVUE-370) 76 % injection (100 mLs Intravenous Contrast Given 08/27/17 1506)     Initial Impression / Assessment and Plan / ED Course  I have reviewed the triage vital  signs and the nursing notes.  Pertinent labs & imaging results that were available during my care of the patient were reviewed by me and considered in my medical decision making (see chart for details).     76yo female with history of breast cancer in remission, glaucoma, and bronchiectasis presents with concern for hemoptysis. Vital signs without significant abnormalities. Hgb normal.  CT PE study done showing bilateral bronchiectasis and numerous bialteral, lower lobe and peripheral predominent tree-in-bud nodularity, favored to represent indolent atypical infection such as MAC.  CT also shows nonspecific nodules.  Patient reports coughing up mucous tinged with blood in ED, appears to be improving.  Discussed with pulmonology. Will give azithromycin rx and have patient follow up with Pulmonology as an outpatient.  Patient discharged in stable condition with understanding of reasons to return.   Final Clinical Impressions(s) / ED Diagnoses   Final diagnoses:  Hemoptysis  Bronchiectasis with acute exacerbation [facility])    ED Discharge Orders        Ordered    azithromycin (ZITHROMAX) 250 MG tablet  Daily     08/27/17 1606       Gareth Morgan, MD 08/27/17 2128

## 2017-08-27 NOTE — ED Triage Notes (Signed)
Pt comes in with complaints of coughing up blood that began last night before bed.  Used an inhaler last night and took a bactrim tablet which is what she took last time she had this happen.  Pt continued coughing up blood this morning. No pain.  No other complaints at this time.

## 2017-08-31 ENCOUNTER — Ambulatory Visit (INDEPENDENT_AMBULATORY_CARE_PROVIDER_SITE_OTHER): Payer: Medicare Other | Admitting: Pulmonary Disease

## 2017-08-31 ENCOUNTER — Encounter: Payer: Self-pay | Admitting: Pulmonary Disease

## 2017-08-31 ENCOUNTER — Other Ambulatory Visit: Payer: Medicare Other

## 2017-08-31 VITALS — BP 114/72 | HR 76 | Ht 61.0 in | Wt 130.0 lb

## 2017-08-31 DIAGNOSIS — R042 Hemoptysis: Secondary | ICD-10-CM

## 2017-08-31 DIAGNOSIS — J479 Bronchiectasis, uncomplicated: Secondary | ICD-10-CM | POA: Diagnosis not present

## 2017-08-31 NOTE — Progress Notes (Signed)
Subjective:    Patient ID: [name] Rice, female    DOB: [birth year], 76 y.o.   MRN: [id number]  HPI   76 year old remote smoker presents for evaluation of hemoptysis and abnormal imaging studies. She is accompanied by her daughter [name] Rice  who is a pediatrician in town.  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 She was diagnosed with dry bronchiectasis about 6 years ago when on a triptan when she developed sudden onset hemoptysis and underwent a CT angiogram.  She is followed with Dr. Daneil Dolin at  [facility] for a few years and I note CT chest from 03/2017 which I reviewed from the records when she had trace hemoptysis that showed increasing bilateral diffuse nodular opacities with bronchiectasis suggestive of mycobacterial infection.  PFTs done also showed mild airway obstruction and she was given an albuterol inhaler which she really has not required much in the past year. She developed an episode of hemoptysis in 12/16, about 1 cup of blood overnight and start attention in the ED.  CT angiogram was done which showed bilateral bronchiectasis and numerous bilateral lower lobe and peripheral tree-in-bud nodules.  This was certainly worse when compared to CT scan from 2009, largest measured about 7 mm.  She was given azithromycin and her hemoptysis has since subsided. She denies shortness of breath or wheezing or frequent chest colds.  She is active outside in her yard.  She has a history of breast cancer she travels.  About once a year to [place]. She has a remote history of smoking less than 10 pack years and she quit more than 50 years ago she drinks alcohol occasionally. No history of weight loss fevers or loss of appetite  Significant tests/ events reviewed  PFTs 2016FEV1 FVC 66 with FEV1 2.09 (102% of predicted), normal MVV normal lung volumes and normal DLCO 2016: Normal, unchanged except  mild obstruction with FEV1 1.71/87%   2011. CT reveals bilateral tree-in-bud opacities more prominent in upper lobe and middle lobe Ct chest 05/14 - bil lower lobe opacities  03/2017 CT angio neg PE, Interval increase of diffuse nodular opacities with areas of bronchiectasis within the lung apices, the right middle lobe and the lingula. S/o MAI      Past Medical History:  Diagnosis Date  . Cancer ([place])    remission- breast  . Glaucoma    Past Surgical History:  Procedure Laterality Date  . BLADDER REPAIR      Allergies  Allergen Reactions  . Apraclonidine Other (See Comments)    Redness of eye  . Bactrim [Sulfamethoxazole-Trimethoprim] Rash    mild    Social History   Socioeconomic History  . Marital status: Married    Spouse name: Not on file  . Number of children: Not on file  . Years of education: Not on file  . Highest education level: Not on file  Social Needs  . Financial resource strain: Not on file  . Food insecurity - worry: Not on file  . Food insecurity - inability: Not on file  . Transportation needs - medical: Not on file  . Transportation needs - non-medical: Not on file  Occupational History  . Not on file  Tobacco Use  . Smoking status: Former Smoker    Types: Cigarettes  . Smokeless tobacco: Never Used  Substance and Sexual Activity  . Alcohol use: Yes  . Drug use: No  .  Sexual activity: Not on file  Other Topics Concern  . Not on file  Social History Narrative  . Not on file      History reviewed. No pertinent family history.   Review of Systems Positive for coughing up blood, nasal congestion  Constitutional: negative for anorexia, fevers and sweats  Eyes: negative for irritation, redness and visual disturbance  Ears, nose, mouth, throat, and face: negative for earaches, epistaxis, nasal congestion and sore throat  Respiratory: negative for cough, dyspnea on exertion, sputum and wheezing  Cardiovascular: negative for chest  pain, dyspnea, lower extremity edema, orthopnea, palpitations and syncope  Gastrointestinal: negative for abdominal pain, constipation, diarrhea, melena, nausea and vomiting  Genitourinary:negative for dysuria, frequency and hematuria  Hematologic/lymphatic: negative for bleeding, easy bruising and lymphadenopathy  Musculoskeletal:negative for arthralgias, muscle weakness and stiff joints  Neurological: negative for coordination problems, gait problems, headaches and weakness  Endocrine: negative for diabetic symptoms including polydipsia, polyuria and weight loss     Objective:   Physical Exam  Gen. Pleasant, thin,well-nourished, in no distress, normal affect ENT - no lesions, no post nasal drip Neck: No JVD, no thyromegaly, no carotid bruits Lungs: no use of accessory muscles, no dullness to percussion, left basal rales , no rhonchi  Cardiovascular: Rhythm regular, heart sounds  normal, no murmurs or gallops, no peripheral edema Abdomen: soft and non-tender, no hepatosplenomegaly, BS normal. Musculoskeletal: No deformities, no cyanosis or clubbing Neuro:  alert, non focal       Assessment & Plan:

## 2017-08-31 NOTE — Assessment & Plan Note (Signed)
Appears to have resolved now, her last episode was 6 months ago and before that About 6 years ago.  Appears to be self-limited each time  Would need cough suppressant and antibiotic for such an episode

## 2017-08-31 NOTE — Patient Instructions (Signed)
You likely have bronchiectasis due to mycobacterial disease that has been progressive  Use nebulizer with saline to induce sputum  Give [place] to sputum specimens to check for AFB culture If this does not work, we will  proceed with bronchoscopy

## 2017-08-31 NOTE — Assessment & Plan Note (Signed)
Bronchiectasis with small nodular infiltrates appears to be worse since 2009 and progressively so.  I reviewed records were reviewed. Lung function was maintained in 2016 and will need to be measured again.  Intermittent bouts of hemoptysis is concerning. Appearance is concerning for mycobacterial infection.  We discussed options including bronchoscopy to confirm this diagnosis and risks and benefits. We will proceed with sputum induction and see if we can obtain sputum for AFB. if not then proceed with bronchoscopy  Since she is relatively asymptomatic she would not want treatment at this time, and we briefly discussed MAC therapy and duration

## 2017-09-11 ENCOUNTER — Other Ambulatory Visit: Payer: Medicare Other

## 2017-09-11 DIAGNOSIS — J479 Bronchiectasis, uncomplicated: Secondary | ICD-10-CM | POA: Diagnosis not present

## 2017-09-13 ENCOUNTER — Telehealth: Payer: Self-pay | Admitting: Pulmonary Disease

## 2017-09-13 DIAGNOSIS — J479 Bronchiectasis, uncomplicated: Secondary | ICD-10-CM

## 2017-09-13 NOTE — Telephone Encounter (Signed)
Another AFB ordered  Pt aware

## 2017-09-14 ENCOUNTER — Other Ambulatory Visit: Payer: Medicare Other

## 2017-09-14 DIAGNOSIS — J479 Bronchiectasis, uncomplicated: Secondary | ICD-10-CM | POA: Diagnosis not present

## 2017-09-14 NOTE — Telephone Encounter (Signed)
[facility] called, I think they were trying to call report the sputum culture.    Component 3d ago  MICRO NUMBER: [id number] P  SPECIMEN QUALITY: ADEQUATE P  Source: SPUTUM P  STATUS: PRELIMINARY P  SMEAR: Rare (1 +) acid-fast bacilli seen using the fluorochrome method. Abnormal  P  RESULT: Culture results to follow. Final reports of negative cultures can be expected in approximately six weeks. Positive cultures are reported immediately. [place]

## 2017-09-15 ENCOUNTER — Telehealth: Payer: Self-pay | Admitting: Pulmonary Disease

## 2017-09-15 NOTE — Telephone Encounter (Signed)
Pl let pt know - also cx results can take up to 4 wks -so will await

## 2017-09-15 NOTE — Telephone Encounter (Signed)
Patient is aware.

## 2017-09-15 NOTE — Telephone Encounter (Signed)
Received a call report from [facility] regarding pt's mycobacteria smear.  Smear was adequate quality.  Sputum showed rare 1+ acid-fast bacili.  Culture results to follow.  A fax is also being sent of pt's result.  Routing this to Dr. Elsworth Soho to make him aware.

## 2017-09-18 ENCOUNTER — Other Ambulatory Visit: Payer: Self-pay

## 2017-09-18 ENCOUNTER — Other Ambulatory Visit: Payer: Medicare Other

## 2017-09-18 DIAGNOSIS — J479 Bronchiectasis, uncomplicated: Secondary | ICD-10-CM

## 2017-09-18 DIAGNOSIS — R042 Hemoptysis: Secondary | ICD-10-CM

## 2017-09-27 ENCOUNTER — Emergency Department (HOSPITAL_COMMUNITY)
Admission: EM | Admit: 2017-09-27 | Discharge: 2017-09-27 | Disposition: A | Discharge status: Home / Self Care | Payer: Medicare Other | Attending: Emergency Medicine | Admitting: Emergency Medicine

## 2017-09-27 ENCOUNTER — Emergency Department (HOSPITAL_COMMUNITY): Payer: Medicare Other

## 2017-09-27 ENCOUNTER — Encounter (HOSPITAL_COMMUNITY): Payer: Self-pay | Admitting: Emergency Medicine

## 2017-09-27 DIAGNOSIS — S52502A Unspecified fracture of the lower end of left radius, initial encounter for closed fracture: Secondary | ICD-10-CM | POA: Insufficient documentation

## 2017-09-27 DIAGNOSIS — Y9301 Activity, walking, marching and hiking: Secondary | ICD-10-CM | POA: Diagnosis not present

## 2017-09-27 DIAGNOSIS — Z87891 Personal history of nicotine dependence: Secondary | ICD-10-CM | POA: Diagnosis not present

## 2017-09-27 DIAGNOSIS — S52125A Nondisplaced fracture of head of left radius, initial encounter for closed fracture: Secondary | ICD-10-CM | POA: Diagnosis not present

## 2017-09-27 DIAGNOSIS — Y999 Unspecified external cause status: Secondary | ICD-10-CM | POA: Diagnosis not present

## 2017-09-27 DIAGNOSIS — W000XXA Fall on same level due to ice and snow, initial encounter: Secondary | ICD-10-CM | POA: Diagnosis not present

## 2017-09-27 DIAGNOSIS — S6992XA Unspecified injury of left wrist, hand and finger(s), initial encounter: Secondary | ICD-10-CM | POA: Diagnosis present

## 2017-09-27 DIAGNOSIS — Y92007 Garden or yard of unspecified non-institutional (private) residence as the place of occurrence of the external cause: Secondary | ICD-10-CM | POA: Insufficient documentation

## 2017-09-27 DIAGNOSIS — Z853 Personal history of malignant neoplasm of breast: Secondary | ICD-10-CM | POA: Diagnosis not present

## 2017-09-27 NOTE — Discharge Instructions (Signed)
You have an IMPACTED, NON-DISPLACED and NON-ANGULATED DISTAL LEFT RADIUS FRACTURE  Please call the orthopedic hand specialist for follow up

## 2017-09-27 NOTE — ED Triage Notes (Signed)
Patient reports that she fell on Monday and been having pain and swelling to left wrist since.  Reports pain is worse with movement.

## 2017-09-27 NOTE — ED Provider Notes (Signed)
[facility] DEPT Provider Note   CSN: [id number] Arrival date & time: 09/27/17  0746     History   Chief Complaint Chief Complaint  Patient presents with  . Wrist Pain    left    HPI [name] Rice is a 77 y.o. female.  HPI Patient is a 77 year old female who presents complaining of left wrist pain after a fall 2 days ago.  She was walking towards the garden and she slipped on ice and fell onto an outstretched hand of her left hand.  She presents with pain with range of motion of her left wrist without obvious deformity.  Pain is moderate in severity and worse with palpation and movement of the left wrist.  No significant left shoulder left elbow pain.  No other injuries.  Patient has been ambulatory.   Past Medical History:  Diagnosis Date  . Cancer ([place])    remission- breast  . Glaucoma     Patient Active Problem List   Diagnosis Date Noted  . Bronchiectasis without complication ([place]) 36/62/9476  . Hemoptysis 08/31/2017  . CHRONIC ANGLE-CLOSURE GLAUCOMA 08/30/2007  . ALLERGIC RHINITIS DUE TO POLLEN 08/30/2007  . TMJ SYNDROME 11/09/2006  . SCIATICA 11/09/2006    Past Surgical History:  Procedure Laterality Date  . BLADDER REPAIR      OB History    No data available       Home Medications    Prior to Admission medications   Medication Sig Start Date End Date Taking? Authorizing Provider  AZOPT 1 % ophthalmic suspension PUT 1 DROP INTO BOTH EYES TWICE A DAY 07/20/17  Yes [provider]  Cholecalciferol (VITAMIN D3) 1000 units CAPS Take 1 capsule by mouth daily.    Yes [provider]  latanoprost (XALATAN) 0.005 % ophthalmic solution Place 1 drop into both eyes at bedtime.  08/02/17  Yes [provider]  MELATONIN PO Take 1 tablet by mouth at bedtime.   Yes [provider]  scopolamine (TRANSDERM-SCOP) 1.5 MG Place 1 patch onto the skin every 3 (three) days. Prn for sea sickness on cruises.    Yes [provider]    Family History No family history on file.  Social History Social History   Tobacco Use  . Smoking status: Former Smoker    Types: Cigarettes  . Smokeless tobacco: Never Used  Substance Use Topics  . Alcohol use: Yes  . Drug use: No     Allergies   Apraclonidine and Bactrim [sulfamethoxazole-trimethoprim]   Review of Systems Review of Systems  All other systems reviewed and are negative.    Physical Exam Updated Vital Signs BP (!) 150/78 (BP Location: Right Arm)   Pulse 84   Temp 97.6 F (36.4 C) (Oral)   Resp 17   SpO2 98%   Physical Exam  Constitutional: She is oriented to person, place, and time. She appears well-developed and well-nourished.  HENT:  Head: Normocephalic.  Eyes: EOM are normal.  Neck: Normal range of motion.  Pulmonary/Chest: Effort normal.  Abdominal: She exhibits no distension.  Musculoskeletal: Normal range of motion.  Mild swelling of the left wrist.  Mild tenderness over the distal left radius.  Normal left radial pulse.  Normal grip strength left hand.  Full range of motion of left shoulder and left elbow.  Neurological: She is alert and oriented to person, place, and time.  Psychiatric: She has a normal mood and affect.  Nursing note and vitals reviewed.  ED Treatments / Results  Labs (all labs ordered are listed, but only abnormal results are displayed) Labs Reviewed - No data to display  EKG  EKG Interpretation None       Radiology Dg Wrist Complete Left  Result Date: 09/27/2017 CLINICAL DATA:  Patient fell 2 days ago and has persistent pain over the radial aspect of the wrist. EXAM: LEFT WRIST - COMPLETE 3+ VIEW COMPARISON:  None in PACs FINDINGS: The bones are subjectively adequately mineralized. There is mild cortical irregularity along the dorsum of the distal radius. The radiocarpal joint appears normal. The adjacent ulna is intact. There are mild degenerative changes of the  intercarpal joints and moderate degenerative change of the first [facility] joint. IMPRESSION: There is an impacted, nondisplaced fracture of the distal radial metaphysis. The adjacent ulna is intact. Mild degenerative changes of the intercarpal joints with moderate degenerative change of the first [facility] joint. Electronically Signed   By: David  Martinique M.D.   On: 09/27/2017 08:50      Procedures .Splint Application Date/Time: 8/54/8830 11:07 AM Performed by: Jola Schmidt, MD Authorized by: Jola Schmidt, MD     Consent: Verbal consent obtained. Risks and benefits: risks, benefits and alternatives were discussed Consent given by: patient Splint applied by: orthopedic technician Location details: left UE Splint type: left sugar tong Supplies used: orthoglass Post-procedure: The splinted body part was neurovascularly unchanged following the procedure. Patient tolerance: Patient tolerated the procedure well with no immediate complications.        Medications Ordered in ED Medications - No data to display   Initial Impression / Assessment and Plan / ED Course  I have reviewed the triage vital signs and the nursing notes.  Pertinent labs & imaging results that were available during my care of the patient were reviewed by me and considered in my medical decision making (see chart for details).     Nondisplaced and non-angulated impacted left distal radius fracture.  Outpatient orthopedic follow-up.  Splinted.  No other injury.  Discharged home in good condition.  Outpatient orthopedic follow-up.  Final Clinical Impressions(s) / ED Diagnoses   Final diagnoses:  Closed fracture of distal end of left radius, unspecified fracture morphology, initial encounter    ED Discharge Orders    None       Jola Schmidt, MD 09/27/17 1108

## 2017-09-27 NOTE — ED Notes (Signed)
When entering room pt had sling and arm splint in place with coat on ready to be discharged. Pt refused reassessment of vital signs at this time. Pt stated "I will take my blood pressure when I get home.

## 2017-09-29 DIAGNOSIS — S6292XA Unspecified fracture of left wrist and hand, initial encounter for closed fracture: Secondary | ICD-10-CM | POA: Diagnosis not present

## 2017-09-29 DIAGNOSIS — M25532 Pain in left wrist: Secondary | ICD-10-CM | POA: Diagnosis not present

## 2017-09-29 DIAGNOSIS — S52502A Unspecified fracture of the lower end of left radius, initial encounter for closed fracture: Secondary | ICD-10-CM | POA: Diagnosis not present

## 2017-10-11 DIAGNOSIS — M25532 Pain in left wrist: Secondary | ICD-10-CM | POA: Diagnosis not present

## 2017-10-11 DIAGNOSIS — S52562D Barton's fracture of left radius, subsequent encounter for closed fracture with routine healing: Secondary | ICD-10-CM | POA: Diagnosis not present

## 2017-10-12 DIAGNOSIS — Z853 Personal history of malignant neoplasm of breast: Secondary | ICD-10-CM | POA: Diagnosis not present

## 2017-10-12 DIAGNOSIS — Z1389 Encounter for screening for other disorder: Secondary | ICD-10-CM | POA: Diagnosis not present

## 2017-10-12 DIAGNOSIS — Z0000 Encounter for general adult medical examination without abnormal findings: Secondary | ICD-10-CM | POA: Diagnosis not present

## 2017-10-12 DIAGNOSIS — J479 Bronchiectasis, uncomplicated: Secondary | ICD-10-CM | POA: Diagnosis not present

## 2017-10-26 DIAGNOSIS — M25532 Pain in left wrist: Secondary | ICD-10-CM | POA: Diagnosis not present

## 2017-10-26 DIAGNOSIS — S6292XD Unspecified fracture of left wrist and hand, subsequent encounter for fracture with routine healing: Secondary | ICD-10-CM | POA: Diagnosis not present

## 2017-10-31 DIAGNOSIS — H401112 Primary open-angle glaucoma, right eye, moderate stage: Secondary | ICD-10-CM | POA: Diagnosis not present

## 2017-10-31 DIAGNOSIS — H401123 Primary open-angle glaucoma, left eye, severe stage: Secondary | ICD-10-CM | POA: Diagnosis not present

## 2017-11-01 LAB — AFB CULTURE WITH SMEAR (NOT AT ARMC)
Acid Fast Culture: NEGATIVE
Acid Fast Smear: NEGATIVE

## 2017-11-08 LAB — MYCOBACTERIA,CULT W/FLUOROCHROME SMEAR
MICRO NUMBER:: 90008923
SMEAR: NONE SEEN
SPECIMEN QUALITY: ADEQUATE

## 2017-11-16 ENCOUNTER — Telehealth: Payer: Self-pay | Admitting: Pulmonary Disease

## 2017-11-16 DIAGNOSIS — S6292XD Unspecified fracture of left wrist and hand, subsequent encounter for fracture with routine healing: Secondary | ICD-10-CM | POA: Diagnosis not present

## 2017-11-16 DIAGNOSIS — M25532 Pain in left wrist: Secondary | ICD-10-CM | POA: Diagnosis not present

## 2017-11-16 NOTE — Telephone Encounter (Signed)
Okay to take

## 2017-11-16 NOTE — Telephone Encounter (Signed)
Called and spoke with patient, she states that she broke her wrist awhile back and now due to it being swollen still and hard to move her orthopedic doctor placed her on a prednisone dose pak. Patient states that she was worried about taking this medication without consulting with Dr. Elsworth Soho first. RA please advise if patient can take this medication. Thanks.

## 2017-11-16 NOTE — Telephone Encounter (Signed)
Patient is aware nothing further needed.

## 2017-11-17 DIAGNOSIS — M25532 Pain in left wrist: Secondary | ICD-10-CM | POA: Diagnosis not present

## 2017-11-21 DIAGNOSIS — M25532 Pain in left wrist: Secondary | ICD-10-CM | POA: Diagnosis not present

## 2017-11-23 DIAGNOSIS — M25532 Pain in left wrist: Secondary | ICD-10-CM | POA: Diagnosis not present

## 2017-11-24 ENCOUNTER — Ambulatory Visit: Payer: Medicare Other | Admitting: Pulmonary Disease

## 2017-11-24 LAB — MYCOBACTERIA,CULT W/FLUOROCHROME SMEAR
MICRO NUMBER:: 81463620
SPECIMEN QUALITY:: ADEQUATE

## 2017-11-24 LAB — M. AVIUM MIC PANEL
AMIKACIN: 32
CIPROFLOXACIN: >16
ETHAMBUTOL: 8
ISONIAZID: 4
RIFABUTIN: 2
RIFAMPIN: 8
STREPTOMYCIN: 64

## 2017-11-28 ENCOUNTER — Encounter: Payer: Self-pay | Admitting: Pulmonary Disease

## 2017-11-28 ENCOUNTER — Ambulatory Visit (INDEPENDENT_AMBULATORY_CARE_PROVIDER_SITE_OTHER): Payer: Medicare Other | Admitting: Pulmonary Disease

## 2017-11-28 DIAGNOSIS — J479 Bronchiectasis, uncomplicated: Secondary | ICD-10-CM | POA: Diagnosis not present

## 2017-11-28 DIAGNOSIS — A310 Pulmonary mycobacterial infection: Secondary | ICD-10-CM | POA: Insufficient documentation

## 2017-11-28 NOTE — Patient Instructions (Signed)
You have bronchiectasis which may be related to MAC infection-Mycobacterium avium.  Schedule PFTs.  Call us if you have weight loss, cough that will not go away, bleeding We discussed signs and symptoms of a chest cold and that she may need longer duration of antibiotics for this

## 2017-11-28 NOTE — Assessment & Plan Note (Signed)
While CT has worsened compared to 2009, lung function appears to be stable, will re-check PFTs to confirm. Hemoptysis was removed and subsided now. Overall she is not very keen on treatment currently, will observe for now

## 2017-11-28 NOTE — Assessment & Plan Note (Signed)
Schedule PFTs.  Call us if you have weight loss, cough that will not go away, bleeding We discussed signs and symptoms of a chest cold and that she may need longer duration of antibiotics for this

## 2017-11-28 NOTE — Progress Notes (Signed)
   Subjective:    Patient ID: [name] Rice, female    DOB: 1940/11/17, 77 y.o.   MRN: [id number]  HPI  77 year old remote smoker  For FU of bronchiectasis & hemoptysis - daughter Barbaraann Share  She was diagnosed with bronchiectasis about  2012 - followed with Dr. Daneil Dolin at  [facility]  After her last visit, we obtain sputum cultures and this indeed showed MAC -sensitive to clarithromycin.  She has not had any further episodes of hemoptysis.  Breathing is stable, she reports occasional wheezing while working in the garden and has used her Ventolin more often in the past few months and she has ever done in her life She is having some work done in the bathroom in her house and wonders if dust exposure could have something to do with the wheezing   Significant tests/ events reviewed  PFTs 2016 FEV1 FVC 66 with FEV1 2.09 (102% of predicted), normal MVV normal lung volumes and normal DLCO 2016: Normal, unchanged except mild obstruction with FEV1 1.71/87%   2011. CT reveals bilateral tree-in-bud opacities more prominent in upper lobe and middle lobe Ct chest 05/14 - bil lower lobe opacities  03/2017 CT angio neg PE, Interval increase of diffuse nodular opacities with areas of bronchiectasis within the lung apices, the right middle lobe and the lingula. S/o MAI  Past Medical History:  Diagnosis Date  . Cancer ([place])    remission- breast  . Glaucoma      Review of Systems neg for any significant sore throat, dysphagia, itching, sneezing, nasal congestion or excess/ purulent secretions, fever, chills, sweats, unintended wt loss, pleuritic or exertional cp, hempoptysis, orthopnea pnd or change in chronic leg swelling. Also denies presyncope, palpitations, heartburn, abdominal pain, nausea, vomiting, diarrhea or change in bowel or urinary habits, dysuria,hematuria, rash, arthralgias, visual complaints, headache, numbness weakness or ataxia.     Objective:   Physical Exam   Gen. Pleasant,  well-nourished, in no distress ENT - no thrush, no post nasal drip Neck: No JVD, no thyromegaly, no carotid bruits Lungs: no use of accessory muscles, no dullness to percussion, clear without rales or rhonchi  Cardiovascular: Rhythm regular, heart sounds  normal, no murmurs or gallops, no peripheral edema Musculoskeletal: No deformities, no cyanosis or clubbing          Assessment & Plan:

## 2017-11-30 DIAGNOSIS — M25532 Pain in left wrist: Secondary | ICD-10-CM | POA: Diagnosis not present

## 2017-12-05 DIAGNOSIS — M25532 Pain in left wrist: Secondary | ICD-10-CM | POA: Diagnosis not present

## 2017-12-07 DIAGNOSIS — S6292XD Unspecified fracture of left wrist and hand, subsequent encounter for fracture with routine healing: Secondary | ICD-10-CM | POA: Diagnosis not present

## 2017-12-12 DIAGNOSIS — M25532 Pain in left wrist: Secondary | ICD-10-CM | POA: Diagnosis not present

## 2017-12-18 ENCOUNTER — Ambulatory Visit (INDEPENDENT_AMBULATORY_CARE_PROVIDER_SITE_OTHER): Payer: Medicare Other | Admitting: Pulmonary Disease

## 2017-12-18 DIAGNOSIS — J479 Bronchiectasis, uncomplicated: Secondary | ICD-10-CM | POA: Diagnosis not present

## 2017-12-18 LAB — PULMONARY FUNCTION TEST
DL/VA % PRED: 86 %
DL/VA: 4.17 ml/min/mmHg/L
DLCO unc % pred: 64 %
DLCO unc: 15.53 ml/min/mmHg
FEF 25-75 POST: 1.21 L/s
FEF 25-75 Pre: 0.97 L/sec
FEF2575-%Change-Post: 24 %
FEF2575-%PRED-POST: 82 %
FEF2575-%PRED-PRE: 66 %
FEV1-%Change-Post: 6 %
FEV1-%Pred-Post: 92 %
FEV1-%Pred-Pre: 87 %
FEV1-PRE: 1.72 L
FEV1-Post: 1.83 L
FEV1FVC-%CHANGE-POST: 6 %
FEV1FVC-%PRED-PRE: 91 %
FEV6-%Change-Post: 0 %
FEV6-%Pred-Post: 99 %
FEV6-%Pred-Pre: 99 %
FEV6-Post: 2.52 L
FEV6-Pre: 2.5 L
FEV6FVC-%Change-Post: 1 %
FEV6FVC-%Pred-Post: 105 %
FEV6FVC-%Pred-Pre: 103 %
FVC-%Change-Post: 0 %
FVC-%PRED-POST: 94 %
FVC-%PRED-PRE: 95 %
FVC-PRE: 2.54 L
FVC-Post: 2.52 L
POST FEV1/FVC RATIO: 73 %
PRE FEV1/FVC RATIO: 68 %
Post FEV6/FVC ratio: 100 %
Pre FEV6/FVC Ratio: 99 %
RV % pred: 122 %
RV: 2.89 L
TLC % pred: 103 %
TLC: 5.23 L

## 2017-12-18 NOTE — Progress Notes (Signed)
PFT done today.

## 2017-12-21 DIAGNOSIS — M25532 Pain in left wrist: Secondary | ICD-10-CM | POA: Diagnosis not present

## 2018-01-16 DIAGNOSIS — H401112 Primary open-angle glaucoma, right eye, moderate stage: Secondary | ICD-10-CM | POA: Diagnosis not present

## 2018-01-16 DIAGNOSIS — H401123 Primary open-angle glaucoma, left eye, severe stage: Secondary | ICD-10-CM | POA: Diagnosis not present

## 2018-01-22 ENCOUNTER — Telehealth: Payer: Self-pay | Admitting: Hematology and Oncology

## 2018-01-22 ENCOUNTER — Inpatient Hospital Stay: Payer: Medicare Other | Attending: Hematology and Oncology | Admitting: Hematology and Oncology

## 2018-01-22 DIAGNOSIS — Z171 Estrogen receptor negative status [ER-]: Secondary | ICD-10-CM

## 2018-01-22 DIAGNOSIS — Z853 Personal history of malignant neoplasm of breast: Secondary | ICD-10-CM | POA: Insufficient documentation

## 2018-01-22 DIAGNOSIS — Z87891 Personal history of nicotine dependence: Secondary | ICD-10-CM | POA: Insufficient documentation

## 2018-01-22 DIAGNOSIS — C50312 Malignant neoplasm of lower-inner quadrant of left female breast: Secondary | ICD-10-CM

## 2018-01-22 NOTE — Telephone Encounter (Signed)
Gave patient AVS and calendar of upcoming may 2020 appointments. Patient declined when asked if scheduling can call the breast center to schedule upcoming appointment. Patient will call to schedule her own appointment at the breast center.

## 2018-01-22 NOTE — Assessment & Plan Note (Signed)
Invasive ductal carcinoma left breast T1c N0 stage I ER 0%, PR 0%, HER-2 negative, wide excision 10/22/2007, reexcision 11/05/2007 and 11/29/2007; adjuvant chemo dose dense AC x4 completed 01/22/2008, adjuvant radiation 4256 cGy and 1000 cGy boost completed 03/10/2008  Breast Cancer Surveillance: 1. Breast exam 01/22/2018: Normal 2. Mammogram will need to be arranged.  I sent an order for breast center to get a mammogram in the next couple weeks.  Survivorship: Patient stays active and exercises and takes care of her health. She eats a healthy diet including fruits and vegetables.  Return to clinic in 1 year for long-term survivorship appointment.

## 2018-01-22 NOTE — Progress Notes (Signed)
[facility] CONSULT NOTE  Patient Care Team: Lavone Orn, MD as PCP - General (Internal Medicine)  CHIEF COMPLAINTS/PURPOSE OF CONSULTATION:  Newly diagnosed breast cancer  HISTORY OF PRESENTING ILLNESS:  [name] Rice 77 y.o. female is here because of recent diagnosis of left breast cancer.  Patient was diagnosed in 2009 and was seen at [facility].  She underwent surgery but had to undergo to reexcision followed by adjuvant chemotherapy and radiation.  She has been in remission for the last 10 years.  She was seen by radiation oncology at [facility] for her annual checkup and she was informed that she could follow with [place] locally.  She is here today accompanied by her husband.  She is been in excellent health.  Recently she was diagnosed with infectious condition but it did not need treatment.  She denies any pain no lumps or nodules in the breast. She was originally from [place] and has immigrated in 1980s.  She has been living in [place] for a long time.  Her daughter is a Lexicographer in [place] and the other daughter lives in [place].  She has 4 grandchildren.  I reviewed her records extensively and collaborated the history with the patient.  SUMMARY OF ONCOLOGIC HISTORY:   Malignant neoplasm of lower-inner quadrant of left breast in female, estrogen receptor negative ([place])   10/22/2007 Initial Diagnosis    Invasive ductal carcinoma left breast T1c N0 stage I ER 0%, PR 0%, HER-2 negative, wide excision 10/22/2007, reexcision 11/05/2007 and 11/29/2007; adjuvant chemo dose dense AC x4 completed 01/22/2008, adjuvant radiation 4256 cGy and 1000 cGy boost completed 03/10/2008       MEDICAL HISTORY:  Past Medical History:  Diagnosis Date  . Cancer ([place])    remission- breast  . Glaucoma     SURGICAL HISTORY: Past Surgical History:  Procedure Laterality Date  . BLADDER REPAIR      SOCIAL HISTORY: Social History   Socioeconomic History  . Marital  status: Married    Spouse name: Not on file  . Number of children: Not on file  . Years of education: Not on file  . Highest education level: Not on file  Occupational History  . Not on file  Social Needs  . Financial resource strain: Not on file  . Food insecurity:    Worry: Not on file    Inability: Not on file  . Transportation needs:    Medical: Not on file    Non-medical: Not on file  Tobacco Use  . Smoking status: Former Smoker    Types: Cigarettes  . Smokeless tobacco: Never Used  Substance and Sexual Activity  . Alcohol use: Yes  . Drug use: No  . Sexual activity: Not on file  Lifestyle  . Physical activity:    Days per week: Not on file    Minutes per session: Not on file  . Stress: Not on file  Relationships  . Social connections:    Talks on phone: Not on file    Gets together: Not on file    Attends religious service: Not on file    Active member of club or organization: Not on file    Attends meetings of clubs or organizations: Not on file    Relationship status: Not on file  . Intimate partner violence:    Fear of current or ex partner: Not on file    Emotionally abused: Not on file    Physically abused: Not on file  Forced sexual activity: Not on file  Other Topics Concern  . Not on file  Social History Narrative  . Not on file    FAMILY HISTORY: No family history on file.  ALLERGIES:  is allergic to apraclonidine and bactrim [sulfamethoxazole-trimethoprim].  MEDICATIONS:  Current Outpatient Medications  Medication Sig Dispense Refill  . AZOPT 1 % ophthalmic suspension PUT 1 DROP INTO BOTH EYES TWICE A DAY  11  . Cholecalciferol (VITAMIN D3) 1000 units CAPS Take 1 capsule by mouth daily.     Marland Kitchen latanoprost (XALATAN) 0.005 % ophthalmic solution Place 1 drop into both eyes at bedtime.     Marland Kitchen MELATONIN PO Take 1 tablet by mouth at bedtime.     No current facility-administered medications for this visit.     REVIEW OF SYSTEMS:    Constitutional: Denies fevers, chills or abnormal night sweats Eyes: Denies blurriness of vision, double vision or watery eyes Ears, nose, mouth, throat, and face: Denies mucositis or sore throat Respiratory: Denies cough, dyspnea or wheezes Cardiovascular: Denies palpitation, chest discomfort or lower extremity swelling Gastrointestinal:  Denies nausea, heartburn or change in bowel habits Skin: Denies abnormal skin rashes Lymphatics: Denies new lymphadenopathy or easy bruising Neurological:Denies numbness, tingling or new weaknesses Behavioral/Psych: Mood is stable, no new changes  Breast:  Denies any palpable lumps or discharge All other systems were reviewed with the patient and are negative.  PHYSICAL EXAMINATION: ECOG PERFORMANCE STATUS: 1 - Symptomatic but completely ambulatory  Vitals:   01/22/18 1251  BP: (!) 147/79  Pulse: 89  Resp: 16  Temp: 98 F (36.7 C)  SpO2: 97%   Filed Weights   01/22/18 1251  Weight: 135 lb (61.2 kg)    GENERAL:alert, no distress and comfortable SKIN: skin color, texture, turgor are normal, no rashes or significant lesions EYES: normal, conjunctiva are pink and non-injected, sclera clear OROPHARYNX:no exudate, no erythema and lips, buccal mucosa, and tongue normal  NECK: supple, thyroid normal size, non-tender, without nodularity LYMPH:  no palpable lymphadenopathy in the cervical, axillary or inguinal LUNGS: clear to auscultation and percussion with normal breathing effort HEART: regular rate & rhythm and no murmurs and no lower extremity edema ABDOMEN:abdomen soft, non-tender and normal bowel sounds Musculoskeletal:no cyanosis of digits and no clubbing  PSYCH: alert & oriented x 3 with fluent speech NEURO: no focal motor/sensory deficits  LABORATORY DATA:  I have reviewed the data as listed Lab Results  Component Value Date   WBC 7.6 08/27/2017   HGB 13.2 08/27/2017   HCT 39.5 08/27/2017   MCV 92.1 08/27/2017   PLT 233  08/27/2017   Lab Results  Component Value Date   NA 137 08/27/2017   K 3.5 08/27/2017   CL 105 08/27/2017   CO2 24 08/27/2017    RADIOGRAPHIC STUDIES: I have personally reviewed the radiological reports and agreed with the findings in the report.  ASSESSMENT AND PLAN:  Malignant neoplasm of lower-inner quadrant of left breast in female, estrogen receptor negative ([facility]) Invasive ductal carcinoma left breast T1c N0 stage I ER 0%, PR 0%, HER-2 negative, wide excision 10/22/2007, reexcision 11/05/2007 and 11/29/2007; adjuvant chemo dose dense AC x4 completed 01/22/2008, adjuvant radiation 4256 cGy and 1000 cGy boost completed 03/10/2008  Breast Cancer Surveillance: 1. Breast exam 01/22/2018: Normal 2. Mammogram will need to be arranged.  I sent an order for breast center to get a mammogram in the next couple weeks.  Survivorship: Patient stays active and exercises and takes care of her health. She  eats a healthy diet including fruits and vegetables.  Return to clinic in 1 year for long-term survivorship appointment.    All questions were answered. The patient knows to call the clinic with any problems, questions or concerns.    Harriette Ohara, MD 01/22/18

## 2018-01-30 ENCOUNTER — Ambulatory Visit (INDEPENDENT_AMBULATORY_CARE_PROVIDER_SITE_OTHER): Payer: Medicare Other | Admitting: Pulmonary Disease

## 2018-01-30 ENCOUNTER — Encounter: Payer: Self-pay | Admitting: Pulmonary Disease

## 2018-01-30 VITALS — BP 124/70 | HR 86 | Temp 98.0°F | Ht 62.0 in | Wt 133.0 lb

## 2018-01-30 DIAGNOSIS — J479 Bronchiectasis, uncomplicated: Secondary | ICD-10-CM | POA: Diagnosis not present

## 2018-01-30 DIAGNOSIS — A310 Pulmonary mycobacterial infection: Secondary | ICD-10-CM

## 2018-01-30 DIAGNOSIS — J301 Allergic rhinitis due to pollen: Secondary | ICD-10-CM

## 2018-01-30 DIAGNOSIS — R0602 Shortness of breath: Secondary | ICD-10-CM

## 2018-01-30 NOTE — Addendum Note (Signed)
Addended by: Vivia Ewing on: 01/30/2018 01:57 PM   Modules accepted: Orders

## 2018-01-30 NOTE — Patient Instructions (Signed)
Take Zyrtec for another 2 weeks and then again during fall. Okay to take Delsym cough syrup 5 mL twice daily as needed for cough  If you have significant sputum production then please give [place] a sample to check for mycobacteria.  High-resolution CT chest in 6 months before next appointment

## 2018-01-30 NOTE — Assessment & Plan Note (Signed)
  If you have significant sputum production then please give [place] a sample to check for mycobacteria.  High-resolution CT chest in 6 months before next appointment

## 2018-01-30 NOTE — Progress Notes (Signed)
Subjective:    Patient ID: [name] Rice, female    DOB: 09/05/1941, 77 y.o.   MRN: [id number]  HPI  77 year old remote smoker  For FU of bronchiectasis due to MAC & hemoptysis - daughter [name] Rice She was diagnosed with bronchiectasis about  2012- followed with Dr. Rosalio Loud  She had a flareup of her cough last 3 weeks.  She was having some work done in the house and they are finally done with that.  She was also working outdoors in her garden. Cough is mostly productive of clear phlegm, occasionally green sputum, no wheezing or shortness of breath  hemoptysis that occurred a few months ago has never recurred  She is planning a trip to [place] in the fall  Significant tests/ events reviewed  PFTs 12/2017 ratio 68, FEV1 87%, FVC 92%, TLC nml, DLCO 68%  PFTs2016 FEV1 FVC 66 with FEV1 2.09 (102% of predicted), normal MVV normal lung volumes and normal DLCO 2016: Normal, unchanged except mild obstruction with FEV1 1.71/87%   2011. CT reveals bilateral tree-in-bud opacities more prominent in upper lobe and middle lobe Ct chest 05/14 - bil lower lobe opacities 03/2017 CT angio neg PE,Interval increase of diffuse nodular opacities with areas of bronchiectasis within the lung apices, the right middle lobe and the lingula.S/o MAI   Review of Systems Patient denies significant dyspnea,cough, hemoptysis,  chest pain, palpitations, pedal edema, orthopnea, paroxysmal nocturnal dyspnea, lightheadedness, nausea, vomiting, abdominal or  leg pains      Objective:   Physical Exam  Gen. Pleasant, thin, in no distress ENT - no thrush, no post nasal drip Neck: No JVD, no thyromegaly, no carotid bruits Lungs: no use of accessory muscles, no dullness to percussion, clear without rales or rhonchi  Cardiovascular: Rhythm regular, heart sounds  normal, no murmurs or gallops, no peripheral edema Musculoskeletal: No deformities, no cyanosis or clubbing        Assessment & Plan:

## 2018-01-30 NOTE — Assessment & Plan Note (Addendum)
Flare up of  her  cough seems mostly related to seasonal rather than worsening MAC  Take Zyrtec for another 2 weeks and then again during fall. Okay to take Delsym cough syrup 5 mL twice daily as needed for cough

## 2018-02-09 ENCOUNTER — Ambulatory Visit: Payer: Self-pay

## 2018-02-20 DIAGNOSIS — H401112 Primary open-angle glaucoma, right eye, moderate stage: Secondary | ICD-10-CM | POA: Diagnosis not present

## 2018-02-20 DIAGNOSIS — H401123 Primary open-angle glaucoma, left eye, severe stage: Secondary | ICD-10-CM | POA: Diagnosis not present

## 2018-02-28 ENCOUNTER — Ambulatory Visit
Admission: RE | Admit: 2018-02-28 | Discharge: 2018-02-28 | Disposition: A | Discharge status: Home / Self Care | Payer: Medicare Other | Source: Ambulatory Visit | Attending: Hematology and Oncology | Admitting: Hematology and Oncology

## 2018-02-28 DIAGNOSIS — Z171 Estrogen receptor negative status [ER-]: Principal | ICD-10-CM

## 2018-02-28 DIAGNOSIS — Z1231 Encounter for screening mammogram for malignant neoplasm of breast: Secondary | ICD-10-CM | POA: Diagnosis not present

## 2018-02-28 DIAGNOSIS — C50312 Malignant neoplasm of lower-inner quadrant of left female breast: Secondary | ICD-10-CM

## 2018-03-29 ENCOUNTER — Encounter: Payer: Self-pay | Admitting: Pulmonary Disease

## 2018-03-29 ENCOUNTER — Ambulatory Visit (INDEPENDENT_AMBULATORY_CARE_PROVIDER_SITE_OTHER): Payer: Medicare Other | Admitting: Pulmonary Disease

## 2018-03-29 VITALS — BP 128/64 | Ht 62.0 in | Wt 129.8 lb

## 2018-03-29 DIAGNOSIS — A310 Pulmonary mycobacterial infection: Secondary | ICD-10-CM

## 2018-03-29 DIAGNOSIS — J479 Bronchiectasis, uncomplicated: Secondary | ICD-10-CM | POA: Diagnosis not present

## 2018-03-29 MED ORDER — CEFDINIR 300 MG PO CAPS: 300.0000 mg | ORAL_CAPSULE | Freq: Two times a day (BID) | ORAL | 0 refills | Status: DC

## 2018-03-29 NOTE — Patient Instructions (Signed)
Check sputum for AFB culture. Prescription for Omnicef 300 mg twice daily for 7days to take with you on your trip. Delsym at 5 mL twice daily as needed when you are having cough

## 2018-03-29 NOTE — Progress Notes (Signed)
   Subjective:    Patient ID: [name] Rice, female    DOB: 10-13-1940, 77 y.o.   MRN: [id number]  HPI  77 yo remote smokerFor FU of bronchiectasis due to MAC & hemoptysis  Sputum cx POS 08/2017 & 09/2017   Chief Complaint  Patient presents with  . Follow-up    Per patient, she has had an increase in her cough. Productive cough with clearish, greenish phlegm. Denies any CP. Has SOB during the coughing spells.     She continues to have intermittent episodes of productive cough, clear white sputum. In May 2019, she had attributed cough to some work going around the house This improved and has now relapsed over the last 10 days, sticky clear white sputum, does not wake her up at night, no wheezing. Hemoptysis has not recurred since December 2018 She is concerned about her trip to [place] planned in the fall and would like some medications to take with her.  Significant tests/ events reviewed  PFTs 12/2017 ratio 68, FEV1 87%, FVC 92%, TLC nml, DLCO 68%  PFTs2016 FEV1 FVC 66 with FEV1 2.09 (102% of predicted), normal MVV normal lung volumes and normal DLCO 2016: Normal, unchanged except mild obstruction with FEV1 1.71/87%   2011. CT reveals bilateral tree-in-bud opacities more prominent in upper lobe and middle lobe Ct chest 05/14 - bil lower lobe opacities 08/2017 CT angio neg PE,Interval increase of diffuse nodular opacities with areas of bronchiectasis within the lung apices, the right middle lobe and the lingula.S/o MAI  Review of Systems neg for any significant sore throat, dysphagia, itching, sneezing, nasal congestion or excess/ purulent secretions, fever, chills, sweats, unintended wt loss, pleuritic or exertional cp, hempoptysis, orthopnea pnd or change in chronic leg swelling.   Also denies presyncope, palpitations, heartburn, abdominal pain, nausea, vomiting, diarrhea or change in bowel or urinary habits, dysuria,hematuria, rash, arthralgias, visual complaints,  headache, numbness weakness or ataxia.     Objective:   Physical Exam  Gen. Pleasant, well-nourished, in no distress ENT - no thrush, no post nasal drip Neck: No JVD, no thyromegaly, no carotid bruits Lungs: no use of accessory muscles, no dullness to percussion, clear without rales or rhonchi  Cardiovascular: Rhythm regular, heart sounds  normal, no murmurs or gallops, no peripheral edema Musculoskeletal: No deformities, no cyanosis or clubbing        Assessment & Plan:

## 2018-03-29 NOTE — Assessment & Plan Note (Signed)
She does not seem to have significant constitutional symptoms & the main complaint is cough.  She may need therapy for MAC eventually but  would like to establish disease progression before committing to this

## 2018-03-29 NOTE — Assessment & Plan Note (Addendum)
Check sputum for AFB culture. Repeat high-resolution CT chest in December or sooner.  She would like to defer this until she returns from her trip.   Prescription for Omnicef 300 mg twice daily for 7days to take with you on your trip. Delsym at 5 mL twice daily as needed when you are having cough

## 2018-03-30 ENCOUNTER — Other Ambulatory Visit: Payer: Medicare Other

## 2018-03-30 DIAGNOSIS — A310 Pulmonary mycobacterial infection: Secondary | ICD-10-CM

## 2018-04-18 ENCOUNTER — Telehealth: Payer: Self-pay | Admitting: Pulmonary Disease

## 2018-04-18 NOTE — Telephone Encounter (Signed)
Received call report from [place] on 03/30/18 sputum No AFB  Pos MAIC

## 2018-04-19 NOTE — Telephone Encounter (Signed)
Rigoberto Noel, MD  Valerie Salts, CMA        Culture again shows Mycobacterium avium.  No change in plan for now but she may need treatment down the line if symptoms persist    LMTCB

## 2018-04-19 NOTE — Telephone Encounter (Signed)
lmtcb for pt. Routing to [place] for follow-up

## 2018-04-19 NOTE — Telephone Encounter (Signed)
Please see result note. Please let patient know that culture again grew Mycobacterium AVIUM. No change in plan as of now but she may need treatment for this in the future especially if symptoms persist

## 2018-04-24 DIAGNOSIS — H401123 Primary open-angle glaucoma, left eye, severe stage: Secondary | ICD-10-CM | POA: Diagnosis not present

## 2018-04-24 DIAGNOSIS — H401112 Primary open-angle glaucoma, right eye, moderate stage: Secondary | ICD-10-CM | POA: Diagnosis not present

## 2018-05-09 NOTE — Progress Notes (Signed)
_0  ID: [name] Rice, female    DOB: September 16, 1940, 77 y.o.   MRN: [id number]  Chief Complaint  Rice presents with  . Follow-up    coughing with bubbly clear with some deep green-little better last week-    Referring provider: Lavone Orn, MD  HPI: [name] Rice, former smoker. PMH bronchiectasis d/t mycobacterium avium complex. Rice of Dr. Elsworth Soho, last seen on 03/29/18 for cough without significant constitutional symptoms. She was given Omicef 373m x 7 days and delsym OTC. Sputum culture positive for MAIC. AFB negative. Needs HRCT in December.   05/10/2018 Rice presents today with complaints of lingering cough x 3 weeks. Mostly dry, occ productive with white bubbly sputum. Reports coughing up green mucus last night. Given Omnicef to take with her on trip overseas in three weeks. She is nervous about getting sick while away and having another episode of hemoptysis. Using cough drops, she has picked up delsym but doesn't like liquid medications. Needs refill of ventolin rescue inhaler, states that its old. Denies fever, sob, N/V/D, night sweats.    Allergies  Allergen Reactions  . Apraclonidine Other (See Comments)    Redness of eye    Immunization History  Administered Date(s) Administered  . Influenza Whole 08/30/2007, 06/26/2017  . Pneumococcal Polysaccharide-23 12/[name]/2008    Past Medical History:  Diagnosis Date  . Cancer ([place]    remission- breast  . Glaucoma     Tobacco History: Social History   Tobacco Use  Smoking Status Former Smoker  . Years: 5.00  . Types: Cigarettes  Smokeless Tobacco Never Used   Counseling given: Not Answered   Outpatient Medications Prior to Visit  Medication Sig Dispense Refill  . AZOPT 1 % ophthalmic suspension PUT 1 DROP INTO BOTH EYES TWICE A DAY  11  . Cholecalciferol (VITAMIN D3) 1000 units CAPS Take 1 capsule by mouth daily.     .Marland Kitchenlatanoprost (XALATAN) 0.005 % ophthalmic solution Place 1 drop into both  eyes at bedtime.     .Marland Kitchenlatanoprost (XALATAN) 0.005 % ophthalmic solution Place 1 drop into the left eye daily.    .Marland KitchenPRESCRIPTION MEDICATION Place 1 drop into the left eye daily. Called [facility] per Rice    . cefdinir (OMNICEF) 300 MG capsule Take 1 capsule (300 mg total) by mouth 2 (two) times daily. (Rice not taking: Reported on 05/10/2018) 14 capsule 0   No facility-administered medications prior to visit.     Review of Systems  Review of Systems  Constitutional: Negative.  Negative for appetite change, fever and unexpected weight change.  HENT: Negative.   Respiratory: Positive for cough. Negative for shortness of breath and wheezing.   Cardiovascular: Negative.     Physical Exam  BP 120/76 (BP Location: Right Arm, Cuff Size: Normal)   Pulse 72   Ht 5' 3.5" (1.613 m)   Wt 128 lb 12.8 oz (58.4 kg)   SpO2 98%   BMI [name].46 kg/m  Physical Exam  Constitutional: She is oriented to person, place, and time. She appears well-developed and well-nourished.  Appears well   HENT:  Head: Normocephalic and atraumatic.  Eyes: Pupils are equal, round, and reactive to light. EOM are normal.  Neck: Normal range of motion. Neck supple.  Cardiovascular: Normal rate, regular rhythm and normal heart sounds.  No murmur heard. Pulmonary/Chest: Effort normal and breath sounds normal. No respiratory distress. She has no wheezes.  CTA. No resp distress, sob or wheeze.   Abdominal: Soft. Bowel sounds  are normal. There is no tenderness.  Neurological: She is alert and oriented to person, place, and time.  Skin: Skin is warm and dry. No rash noted. No erythema.  Psychiatric: She has a normal mood and affect. Her behavior is normal. Judgment normal.     Lab Results:  CBC    Component Value Date/Time   WBC 7.6 08/27/2017 1411   RBC 4.29 08/27/2017 1411   HGB 13.2 08/27/2017 1411   HCT 39.5 08/27/2017 1411   PLT 233 08/27/2017 1411   MCV 92.1 08/27/2017 1411   MCH 30.8 08/27/2017 1411    MCHC 33.4 08/27/2017 1411   RDW 13.5 08/27/2017 1411   LYMPHSABS 1.8 08/27/2017 1411   MONOABS 0.5 08/27/2017 1411   EOSABS 0.2 08/27/2017 1411   BASOSABS 0.1 08/27/2017 1411    BMET    Component Value Date/Time   NA 137 08/27/2017 1411   K 3.5 08/27/2017 1411   CL 105 08/27/2017 1411   CO2 24 08/27/2017 1411   GLUCOSE 114 (H) 08/27/2017 1411   BUN 11 08/27/2017 1411   CREATININE 0.78 08/27/2017 1411   CALCIUM 9.0 08/27/2017 1411   GFRNONAA >60 08/27/2017 1411   GFRAA >60 08/27/2017 1411    BNP No results found for: BNP  ProBNP No results found for: PROBNP  Imaging: Dg Chest 2 View  Result Date: 05/10/2018 CLINICAL DATA:  Productive cough for several months EXAM: CHEST - 2 VIEW COMPARISON:  08/27/2017 FINDINGS: Cardiac shadow is stable. Aortic calcifications are again seen. Chronic interstitial changes are again identified bilaterally similar to that seen on the prior exam. No focal infiltrate or sizable effusion is noted. Hyperinflation is seen stable from the prior study. No acute bony abnormality is noted. IMPRESSION: Chronic interstitial changes bilaterally stable from the prior exam. No acute abnormality is seen. Aortic Atherosclerosis (ICD10-I70.0) and Emphysema (ICD10-J43.9). Electronically Signed   By: Inez Catalina M.D.   On: 05/10/2018 10:15     Assessment & Plan:   Bronchiectasis without complication ([place]) -CXR with no acute abnormality -Take Omnicef with her on vacation in case acutely sick  -Treat cough with Mucinex DM, flutter valve and tessalon perles as needed for coughing fits only   Pulmonary Mycobacterium avium complex (MAC) infection ([place]) - Main complaint is cough, no constitutional symptoms. She appears well in office.  - CXR with chronic interstitial changes bilaterally stable from previous, no acute abnormality - May need extended MAC treatment down the line  - HRCT planned for December      Martyn Ehrich, NP 05/10/2018

## 2018-05-10 ENCOUNTER — Encounter: Payer: Self-pay | Admitting: Primary Care

## 2018-05-10 ENCOUNTER — Ambulatory Visit (INDEPENDENT_AMBULATORY_CARE_PROVIDER_SITE_OTHER)
Admission: RE | Admit: 2018-05-10 | Discharge: 2018-05-10 | Disposition: A | Discharge status: Home / Self Care | Payer: Medicare Other | Source: Ambulatory Visit | Attending: Primary Care | Admitting: Primary Care

## 2018-05-10 ENCOUNTER — Ambulatory Visit (INDEPENDENT_AMBULATORY_CARE_PROVIDER_SITE_OTHER): Payer: Medicare Other | Admitting: Primary Care

## 2018-05-10 VITALS — BP 120/76 | HR 72 | Ht 63.5 in | Wt 128.8 lb

## 2018-05-10 DIAGNOSIS — A310 Pulmonary mycobacterial infection: Secondary | ICD-10-CM | POA: Diagnosis not present

## 2018-05-10 DIAGNOSIS — J479 Bronchiectasis, uncomplicated: Secondary | ICD-10-CM | POA: Diagnosis not present

## 2018-05-10 DIAGNOSIS — R059 Cough, unspecified: Secondary | ICD-10-CM

## 2018-05-10 DIAGNOSIS — R05 Cough: Secondary | ICD-10-CM

## 2018-05-10 MED ORDER — FLUTTER DEVI: 1.0000 | Freq: Three times a day (TID) | 0 refills | Status: DC

## 2018-05-10 MED ORDER — ALBUTEROL SULFATE HFA 108 (90 BASE) MCG/ACT IN AERS: 2.0000 | INHALATION_SPRAY | Freq: Four times a day (QID) | RESPIRATORY_TRACT | 2 refills | Status: DC | PRN

## 2018-05-10 NOTE — Patient Instructions (Signed)
CXR today  Mucinex DM   Tessalon perles x3 times a day as needed for coughing fits  Flutter valve three times a day  Will refill ventolin inhaler

## 2018-05-10 NOTE — Assessment & Plan Note (Signed)
-  Main complaint is cough, no constitutional symptoms. She appears well in office.  - CXR with chronic interstitial changes bilaterally stable from previous, no acute abnormality - May need extended MAC treatment down the line  - HRCT planned for December

## 2018-05-10 NOTE — Assessment & Plan Note (Signed)
-  CXR with no acute abnormality -Take Omnicef with her on vacation in case acutely sick  -Treat cough with Mucinex DM, flutter valve and tessalon perles as needed for coughing fits only

## 2018-05-21 DIAGNOSIS — Z23 Encounter for immunization: Secondary | ICD-10-CM | POA: Diagnosis not present

## 2018-05-21 LAB — M. AVIUM MIC PANEL
AMIKACIN: 64
CIPROFLOXACIN: >16
CLARITHROMYCIN: 2
ETHAMBUTOL: 4
ISONIAZID: 8
MOXIFLOXACIN: 4
RIFABUTIN: 2
RIFAMPIN: 8

## 2018-05-21 LAB — MYCOBACTERIA,CULT W/FLUOROCHROME SMEAR
MICRO NUMBER:: 90857854
SMEAR:: NONE SEEN
SPECIMEN QUALITY:: ADEQUATE

## 2018-07-31 DIAGNOSIS — H401123 Primary open-angle glaucoma, left eye, severe stage: Secondary | ICD-10-CM | POA: Diagnosis not present

## 2018-07-31 DIAGNOSIS — H401112 Primary open-angle glaucoma, right eye, moderate stage: Secondary | ICD-10-CM | POA: Diagnosis not present

## 2018-08-02 ENCOUNTER — Ambulatory Visit (INDEPENDENT_AMBULATORY_CARE_PROVIDER_SITE_OTHER)
Admission: RE | Admit: 2018-08-02 | Discharge: 2018-08-02 | Disposition: A | Discharge status: Home / Self Care | Payer: Medicare Other | Source: Ambulatory Visit | Attending: Pulmonary Disease | Admitting: Pulmonary Disease

## 2018-08-02 DIAGNOSIS — J40 Bronchitis, not specified as acute or chronic: Secondary | ICD-10-CM | POA: Diagnosis not present

## 2018-08-02 DIAGNOSIS — J479 Bronchiectasis, uncomplicated: Secondary | ICD-10-CM

## 2018-08-02 DIAGNOSIS — R0602 Shortness of breath: Secondary | ICD-10-CM | POA: Diagnosis not present

## 2018-08-07 ENCOUNTER — Ambulatory Visit: Payer: Medicare Other | Admitting: Pulmonary Disease

## 2018-08-23 ENCOUNTER — Ambulatory Visit (INDEPENDENT_AMBULATORY_CARE_PROVIDER_SITE_OTHER): Payer: Medicare Other | Admitting: Pulmonary Disease

## 2018-08-23 ENCOUNTER — Encounter: Payer: Self-pay | Admitting: Pulmonary Disease

## 2018-08-23 DIAGNOSIS — J479 Bronchiectasis, uncomplicated: Secondary | ICD-10-CM | POA: Diagnosis not present

## 2018-08-23 DIAGNOSIS — A310 Pulmonary mycobacterial infection: Secondary | ICD-10-CM | POA: Diagnosis not present

## 2018-08-23 NOTE — Patient Instructions (Signed)
Referral to infectious disease to start on treatment for MAC

## 2018-08-23 NOTE — Assessment & Plan Note (Addendum)
Clear disease progression compared to 2018 We will refer her to infectious disease to start on treatment Discussed potential side effects of medications We will monitor cough and periodic CT

## 2018-08-23 NOTE — Assessment & Plan Note (Signed)
Has disease progression. Airway clearance still the strategy with Mucinex  Hypertonic saline in the future if needed

## 2018-08-23 NOTE — Progress Notes (Signed)
   Subjective:    Patient ID: [name] Rice, female    DOB: [birth year], 77 y.o.   MRN: [id number]  HPI  77 yo remote smokerFor FU of bronchiectasisdue to MAC She initially presented with  hemoptysis  Sputum cx POS 08/2017 & 09/2017   She had a good trip to [place], took the [place] that she was prescribed on one occasion, had one episode of hemoptysis which was self-limited  continues to have chronic cough with minimum sputum. Has lost about 6 pounds over the last 6 months to current weight of 127 Breathing is otherwise stable  We reviewed HRCT in detail today  Significant tests/ events reviewed  PFTs 12/2017 ratio 68, FEV1 87%, FVC 92%, TLC nml, DLCO 68%  PFTs2016 FEV1 FVC 66 with FEV1 2.09 (102% of predicted), normal MVV normal lung volumes and normal DLCO 2016: Normal, unchanged except mild obstruction with FEV1 1.71/87%   2011. CT reveals bilateral tree-in-bud opacities more prominent in upper lobe and middle lobe Ct chest 05/14 - bil lower lobe opacities 08/2017 CT angio neg PE,Interval increase of diffuse nodular opacities with areas of bronchiectasis within the lung apices, the right middle lobe and the lingula.S/o MAI  07/2018 worsening bronchiectasis   Review of Systems neg for any significant sore throat, dysphagia, itching, sneezing, nasal congestion or excess/ purulent secretions, fever, chills, sweats, unintended wt loss, pleuritic or exertional cp, hempoptysis, orthopnea pnd or change in chronic leg swelling. Also denies presyncope, palpitations, heartburn, abdominal pain, nausea, vomiting, diarrhea or change in bowel or urinary habits, dysuria,hematuria, rash, arthralgias, visual complaints, headache, numbness weakness or ataxia.     Objective:   Physical Exam   Gen. Pleasant, thin frail , in no distress ENT - no thrush, no post nasal drip Neck: No JVD, no thyromegaly, no carotid bruits Lungs: no use of accessory muscles, no dullness to percussion,  rt basal  rales no rhonchi  Cardiovascular: Rhythm regular, heart sounds  normal, no murmurs or gallops, no peripheral edema Musculoskeletal: No deformities, no cyanosis or clubbing         Assessment & Plan:

## 2018-08-30 ENCOUNTER — Telehealth: Payer: Self-pay | Admitting: Pulmonary Disease

## 2018-08-30 DIAGNOSIS — A310 Pulmonary mycobacterial infection: Secondary | ICD-10-CM

## 2018-08-30 NOTE — Telephone Encounter (Signed)
Plan from pt's OV with RA 12/12: Referral to infectious disease to start on treatment for MAC  Checked the order review tab as well as referral tab and there was no order that had been placed.  Called and spoke with pt letting her know that she should hopefully hear from ID dept soon in regards to this.  I have taken care of placing order for the referral for pt. Nothing further needed.

## 2018-09-11 ENCOUNTER — Ambulatory Visit: Payer: Medicare Other | Admitting: Infectious Diseases

## 2018-09-11 ENCOUNTER — Telehealth: Payer: Self-pay | Admitting: Hematology and Oncology

## 2018-09-11 NOTE — Telephone Encounter (Signed)
Patient stopped by to change 01/22/19 LTS visit to 01/15/19. Gave patient updated schedule.

## 2018-09-17 ENCOUNTER — Ambulatory Visit (INDEPENDENT_AMBULATORY_CARE_PROVIDER_SITE_OTHER): Payer: Medicare Other | Admitting: Internal Medicine

## 2018-09-17 ENCOUNTER — Encounter: Payer: Self-pay | Admitting: Internal Medicine

## 2018-09-17 VITALS — BP 146/75 | HR 89 | Temp 97.9°F | Wt 127.0 lb

## 2018-09-17 DIAGNOSIS — A310 Pulmonary mycobacterial infection: Secondary | ICD-10-CM

## 2018-09-17 MED ORDER — RIFAMPIN 300 MG PO CAPS: 600.0000 mg | ORAL_CAPSULE | ORAL | 3 refills | Status: DC

## 2018-09-17 MED ORDER — AZITHROMYCIN 500 MG PO TABS: 500.0000 mg | ORAL_TABLET | ORAL | 3 refills | Status: DC

## 2018-09-17 MED ORDER — ETHAMBUTOL HCL 400 MG PO TABS: 1200.0000 mg | ORAL_TABLET | ORAL | 3 refills | Status: DC

## 2018-09-17 NOTE — Progress Notes (Signed)
HPI: [name] Rice is a 78 y.o. female who presents to the [facility] today to see Dr. Linus Salmons for her pulmonary MAC infection.  Patient Active Problem List   Diagnosis Date Noted  . Malignant neoplasm of lower-inner quadrant of left breast in female, estrogen receptor negative ([place]) 01/22/2018  . Pulmonary Mycobacterium avium complex (MAC) infection ([place]) 11/28/2017  . Bronchiectasis without complication ([place]) 16/06/9603  . Hemoptysis 08/31/2017  . CHRONIC ANGLE-CLOSURE GLAUCOMA 08/30/2007  . ALLERGIC RHINITIS DUE TO POLLEN 08/30/2007  . TMJ SYNDROME 11/09/2006  . SCIATICA 11/09/2006    Patient's Medications  New Prescriptions   AZITHROMYCIN (ZITHROMAX) 500 MG TABLET    Take 1 tablet (500 mg total) by mouth 3 (three) times a week.   ETHAMBUTOL (MYAMBUTOL) 400 MG TABLET    Take 3 tablets (1,200 mg total) by mouth 3 (three) times a week.   RIFAMPIN (RIFADIN) 300 MG CAPSULE    Take 2 capsules (600 mg total) by mouth 3 (three) times a week.  Previous Medications   ALBUTEROL (PROVENTIL HFA;VENTOLIN HFA) 108 (90 BASE) MCG/ACT INHALER    Inhale 2 puffs into the lungs every 6 (six) hours as needed for wheezing or shortness of breath.   AZOPT 1 % OPHTHALMIC SUSPENSION    PUT 1 DROP INTO BOTH EYES TWICE A DAY   CHOLECALCIFEROL (VITAMIN D3) 1000 UNITS CAPS    Take 1 capsule by mouth daily.    LATANOPROST (XALATAN) 0.005 % OPHTHALMIC SOLUTION    Place 1 drop into both eyes at bedtime.    LATANOPROST (XALATAN) 0.005 % OPHTHALMIC SOLUTION    Place 1 drop into the left eye daily.   PRESCRIPTION MEDICATION    Place 1 drop into the left eye daily. Called Rhopressa per patient   RESPIRATORY THERAPY SUPPLIES (FLUTTER) DEVI    1 Device by Does not apply route 3 (three) times daily.  Modified Medications   No medications on file  Discontinued Medications   No medications on file    Allergies: Allergies  Allergen Reactions  . Apraclonidine Other (See Comments)    Redness of eye    Past  Medical History: Past Medical History:  Diagnosis Date  . Cancer ([place])    remission- breast  . Glaucoma     Social History: Social History   Socioeconomic History  . Marital status: Married    Spouse name: Not on file  . Number of children: Not on file  . Years of education: Not on file  . Highest education level: Not on file  Occupational History  . Not on file  Social Needs  . Financial resource strain: Not on file  . Food insecurity:    Worry: Not on file    Inability: Not on file  . Transportation needs:    Medical: Not on file    Non-medical: Not on file  Tobacco Use  . Smoking status: Former Smoker    Years: 5.00    Types: Cigarettes  . Smokeless tobacco: Never Used  Substance and Sexual Activity  . Alcohol use: Yes  . Drug use: No  . Sexual activity: Not on file  Lifestyle  . Physical activity:    Days per week: Not on file    Minutes per session: Not on file  . Stress: Not on file  Relationships  . Social connections:    Talks on phone: Not on file    Gets together: Not on file    Attends religious service: Not on  file    Active member of club or organization: Not on file    Attends meetings of clubs or organizations: Not on file    Relationship status: Not on file  Other Topics Concern  . Not on file  Social History Narrative  . Not on file    Labs: No results found for: HIV1RNAQUANT, HIV1RNAVL, CD4TABS  RPR and STI No results found for: LABRPR, RPRTITER  No flowsheet data found.  Hepatitis B No results found for: HEPBSAB, HEPBSAG, HEPBCAB Hepatitis C No results found for: HEPCAB, HCVRNAPCRQN Hepatitis A No results found for: HAV Lipids: Lab Results  Component Value Date   CHOL 161 07/24/2007   TRIG 43 07/24/2007   HDL 54 07/24/2007   CHOLHDL 3.0 Ratio 07/24/2007   VLDL 9 07/24/2007   LDLCALC 98 07/24/2007    Assessment: [name] Rice is here today to see Dr. Linus Salmons for her pulmonary MAC infection.  She has never been treated, so Dr.  Linus Salmons will start three times weekly treatment for her today.  Discussed the three antibiotics she will be started on. She will take azithromycin 500 mg (1 tablet), ethambutol 1200 mg (3 tablets), and rifampin 600 mg (2 tablets) three times weekly.  I discussed the different ways to take these medications and we decided spacing them out would be the best option.  Discussed the need to take rifampin on an empty stomach either 1 hour before or 2 hours after a meal. She thinks taking this after dinner would be best before bed. They eat dinner around 5-6pm and go to bed around 9pm, which will be perfect.  Also explained how azithromycin and ethambutol can be taken with or without food.  She will take azithromycin and ethambutol on MWF and rifampin on Tues, Thurs, and Saturday.  Cautioned that rifampin can make any secretions (tears, urine, saliva) an orangish/red color and to not be alarmed.  Explained ethambutol and the vision changes that could possibly come with that medication. She sees an eye doctor every 3 months any way for her glaucoma. I gave her a pill box and wrote down the schedule on her AVS. I gave her my card and told her to call me with any issues/questions/concerns.  Plan: - Azithromycin 500 mg (1 tablet) PO TIW (MWF) - Ethambutol 1200 mg (3 tablets) PO TIW (MWF) - Rifampin 600 mg (2 capsules) PO TIW (TThSat) on an empty stomach - F/u with Dr. Linus Salmons in 4 weeks  Geraldin Habermehl L. Obdulio Mash, PharmD, BCIDP, AAHIVP, [facility] 09/17/2018, 3:42 PM

## 2018-09-17 NOTE — Patient Instructions (Signed)
NONTUBERCULOUS MYCOBACTERIAL LUNG DISEASE  You have been diagnosed with a lung infection caused by a nontuberculous mycobacterium (MAC).  What are nontuberculous mycobacteria? Nontuberculous mycobacteria (abbreviated NTM) are germs that are related to tuberculosis but that live in the environment, primarily in the soil and water. The difference between NTM and tuberculosis is that NTM are not spread by person to person contact and are not considered to be contagious.  NTM infection is acquired from the environment. We do not know how or why people become infected with NTM germs. Although we can easily recover the germ from soil, water, and air samples, most people do not become sick from this organism. Scientists and physicians who study these germs think that perhaps the people who become infected have some defect in the structure or function of their lungs or in their immune systems. People who have damaged lung tissue from previous tuberculosis, heavy smoking, and a breathing tube disease called bronchiectasis, are three recognized risk factors which make you more likely to develop this disease. Disease in men most commonly relates to heavy smoking, while disease in women most commonly relates to bronchiectasis.   What are the names of some common nontuberculous mycobacteria? The most common NTM germs involved in human infection are Mycobacterium avium complex (MAC), M. kansasii, M. chelonae, and M. Abscessus.  Can the infection spread outside my lungs? In general, the infection does not spread outside the lungs in persons with an otherwise normal immune system. People with a severely compromised immune system, such as persons with HIV infection, persons who are on medications that suppress the immune system (such as prednisone), or persons who have had organ or bone marrow transplants may develop disease outside the lungs.  Can this infection be cured? NTM infections in the lung are hard to  treat, requiring multiple antibiotics for a long period of time (usually at least a year). Treatment may consist of pills along with intravenous antibiotics. Even with the best available treatments, the cure rate for NTM lung disease is often low (for example, about 55-65% for MAC lung disease). Also, a person can become reinfected after treatment is completed.  Will this infection kill me? In general, NTM lung infections are slow-moving; most people will not die from the infection, but will die with the infection. Some patients can have these infections for decades and live relatively healthy and productive lives.  What can I do besides take antibiotics to help my body with this infection? There are a number of things you can do to keep yourself healthy. Regular aerobic exercise (brisk walking, bicycling, swimming, etc.) is really important for overall health and to optimize lung function. Doing these activities for at least 30 minutes at least 3 (preferably 5 or more) days per week will help you stay healthy. Getting a flu shot every year is also important, and you should get the pneumonia shot (Pneumovax) at least once. Don't smoke, and stay away from people who are smoking. Wash your hands regularly or use hand sanitizer after being around crowds or in public places.   More information available at [email or web address] from [facility].

## 2018-09-17 NOTE — Progress Notes (Signed)
[facility]      Reason for Consult: bronchiectasis with Mycobacterial avium infection    Referring Physician: Dr. Elsworth Soho    Patient ID: [name] Rice, female    DOB: [birth year], 78 y.o.   MRN: [id number]  HPI:   Here for evaluation and consideration for treatment of above.  She was initially diagnosed with bronchiectasis about 6 years ago and subsequently developed hemoptysis.  She has had mild obstructive disease on PFTs and per her report this has remained stable.  She though has had some increase in sputum production, cCoughing and some increased WOB.  No associated fever, no night sweats.  Has been followed by Dr. Elsworth Soho and radiographically as well has been worsening with recent CT scan in November with progression of disease.  Sputum also has been persistently positive with MAI.  No pervious treatment for this.  Has had exacerbations treated with azithromycin.   Record from [facility] and Dr. Elsworth Soho reviewed and  Summarized above along with information from the patient.    Past Medical History:  Diagnosis Date  . Cancer ([place])    remission- breast  . Glaucoma     Prior to Admission medications   Medication Sig Start Date End Date Taking? Authorizing Provider  albuterol (PROVENTIL HFA;VENTOLIN HFA) 108 (90 Base) MCG/ACT inhaler Inhale 2 puffs into the lungs every 6 (six) hours as needed for wheezing or shortness of breath. 05/10/18   Martyn Ehrich, NP  AZOPT 1 % ophthalmic suspension PUT 1 DROP INTO BOTH EYES TWICE A DAY 07/20/17   [provider]  Cholecalciferol (VITAMIN D3) 1000 units CAPS Take 1 capsule by mouth daily.     [provider]  latanoprost (XALATAN) 0.005 % ophthalmic solution Place 1 drop into both eyes at bedtime.  08/02/17   [provider]  latanoprost (XALATAN) 0.005 % ophthalmic solution Place 1 drop into the left eye daily. 04/25/17   [provider]  PRESCRIPTION MEDICATION Place 1 drop into the left  eye daily. Called Rhopressa per patient    [provider]  Respiratory Therapy Supplies (FLUTTER) DEVI 1 Device by Does not apply route 3 (three) times daily. 05/10/18   Martyn Ehrich, NP    Allergies  Allergen Reactions  . Apraclonidine Other (See Comments)    Redness of eye    Social History   Tobacco Use  . Smoking status: Former Smoker    Years: 5.00    Types: Cigarettes  . Smokeless tobacco: Never Used  Substance Use Topics  . Alcohol use: Yes  . Drug use: No    FMH: mother with glaucoma No known alpha1antitrypsin  Review of Systems  Constitutional: negative for fevers, chills, sweats, fatigue, malaise and anorexia Respiratory: positive for cough, sputum or dyspnea on exertion, negative for hemoptysis Gastrointestinal: negative for nausea and diarrhea Integument/breast: negative for rash All other systems reviewed and are negative    Constitutional: in no apparent distress There were no vitals filed for this visit. EYES: anicteric ENMT: no thrush Cardiovascular: Cor RRR Respiratory: CTA B; normal respiratory effort; no wheezes, no rales GI: Bowel sounds are normal Musculoskeletal: no pedal edema noted Skin: negatives: no rash Hematologic: no cervical lad  Labs: Lab Results  Component Value Date   WBC 7.6 08/27/2017   HGB 13.2 08/27/2017   HCT 39.5 08/27/2017   MCV 92.1 08/27/2017   PLT 233 08/27/2017    Lab Results  Component Value Date   CREATININE 0.78  08/27/2017   BUN 11 08/27/2017   NA 137 08/27/2017   K 3.5 08/27/2017   CL 105 08/27/2017   CO2 24 08/27/2017    Lab Results  Component Value Date   ALT 14 08/27/2017   AST 21 08/27/2017   ALKPHOS 61 08/27/2017   BILITOT 1.1 08/27/2017     Assessment: symptomatic Mycobacterial avium bronchiectasis with nodules.  I discussed goals of treatment, expectations, outcomes.  May relieve some symptoms but overall trying to reduce progression of disease and future complications.  Handout  provided. Side effects discussed.   Plan: 1) CMP, cbc 2) azithromycin, ethambutol, rifampin three times per week RTC 4 weeks to monitor tolerance

## 2018-09-18 ENCOUNTER — Telehealth: Payer: Self-pay | Admitting: Pharmacist

## 2018-09-18 DIAGNOSIS — A310 Pulmonary mycobacterial infection: Secondary | ICD-10-CM

## 2018-09-18 LAB — COMPREHENSIVE METABOLIC PANEL
AG RATIO: 1.2 (calc) (ref 1.0–2.5)
ALBUMIN MSPROF: 4 g/dL (ref 3.6–5.1)
ALT: 10 U/L (ref 6–29)
AST: 16 U/L (ref 10–35)
Alkaline phosphatase (APISO): 78 U/L (ref 33–130)
BUN: 13 mg/dL (ref 7–25)
CO2: 26 mmol/L (ref 20–32)
Calcium: 9.6 mg/dL (ref 8.6–10.4)
Chloride: 104 mmol/L (ref 98–110)
Creat: 0.64 mg/dL (ref 0.60–0.93)
GLUCOSE: 96 mg/dL (ref 65–99)
Globulin: 3.4 g/dL (calc) (ref 1.9–3.7)
Potassium: 4.8 mmol/L (ref 3.5–5.3)
SODIUM: 139 mmol/L (ref 135–146)
TOTAL PROTEIN: 7.4 g/dL (ref 6.1–8.1)
Total Bilirubin: 0.6 mg/dL (ref 0.2–1.2)

## 2018-09-18 LAB — CBC WITH DIFFERENTIAL/PLATELET
Absolute Monocytes: 580 cells/uL (ref 200–950)
BASOS ABS: 50 {cells}/uL (ref 0–200)
Basophils Relative: 0.5 %
EOS ABS: 180 {cells}/uL (ref 15–500)
Eosinophils Relative: 1.8 %
HEMATOCRIT: 39 % (ref 35.0–45.0)
HEMOGLOBIN: 13.2 g/dL (ref 11.7–15.5)
LYMPHS ABS: 2760 {cells}/uL (ref 850–3900)
MCH: 30.6 pg (ref 27.0–33.0)
MCHC: 33.8 g/dL (ref 32.0–36.0)
MCV: 90.3 fL (ref 80.0–100.0)
MPV: 11.7 fL (ref 7.5–12.5)
Monocytes Relative: 5.8 %
NEUTROS ABS: 6430 {cells}/uL (ref 1500–7800)
Neutrophils Relative %: 64.3 %
Platelets: 326 10*3/uL (ref 140–400)
RBC: 4.32 10*6/uL (ref 3.80–5.10)
RDW: 12.3 % (ref 11.0–15.0)
Total Lymphocyte: 27.6 %
WBC: 10 10*3/uL (ref 3.8–10.8)

## 2018-09-18 MED ORDER — ETHAMBUTOL HCL 400 MG PO TABS: 1200.0000 mg | ORAL_TABLET | ORAL | 11 refills | Status: DC

## 2018-09-18 MED ORDER — AZITHROMYCIN 500 MG PO TABS: 500.0000 mg | ORAL_TABLET | ORAL | 11 refills | Status: DC

## 2018-09-18 MED ORDER — RIFAMPIN 300 MG PO CAPS: 600.0000 mg | ORAL_CAPSULE | ORAL | 11 refills | Status: DC

## 2018-09-18 MED FILL — rifAMPin 300 MG CAPS: 300 | 28 days supply | Qty: 24 | Fill #0

## 2018-09-18 MED FILL — ETHAMBUTOL HCL 400 MG TAB: 400 | 28 days supply | Qty: 36 | Fill #0

## 2018-09-18 MED FILL — AZITHROMYCIN 500 MG TABLET: 500 | 28 days supply | Qty: 12 | Fill #0

## 2018-09-18 NOTE — Telephone Encounter (Signed)
Patient saw Dr. Linus Salmons yesterday for pulmonary MAC. She was prescribed ethambutol, rifampin, and azithromycin.  She went to the pharmacy to pick it up and her copays were >$250. I was able to get her a grant for $2000 to cover these costs and she has no copays.  She will pick them up tomorrow afternoon at [facility].

## 2018-09-25 DIAGNOSIS — H401112 Primary open-angle glaucoma, right eye, moderate stage: Secondary | ICD-10-CM | POA: Diagnosis not present

## 2018-09-25 DIAGNOSIS — H401123 Primary open-angle glaucoma, left eye, severe stage: Secondary | ICD-10-CM | POA: Diagnosis not present

## 2018-09-25 DIAGNOSIS — Z79899 Other long term (current) drug therapy: Secondary | ICD-10-CM | POA: Diagnosis not present

## 2018-10-10 MED FILL — ETHAMBUTOL HCL 400 MG TAB: 400 | 28 days supply | Qty: 36 | Fill #1

## 2018-10-10 MED FILL — AZITHROMYCIN 500 MG TABLET: 500 | 28 days supply | Qty: 12 | Fill #1

## 2018-10-10 MED FILL — rifAMPin 300 MG CAPS: 300 | 28 days supply | Qty: 24 | Fill #1

## 2018-10-16 ENCOUNTER — Ambulatory Visit (INDEPENDENT_AMBULATORY_CARE_PROVIDER_SITE_OTHER): Payer: Medicare Other | Admitting: Internal Medicine

## 2018-10-16 ENCOUNTER — Encounter: Payer: Self-pay | Admitting: Internal Medicine

## 2018-10-16 VITALS — BP 148/77 | HR 86 | Temp 97.6°F | Wt 121.0 lb

## 2018-10-16 DIAGNOSIS — J479 Bronchiectasis, uncomplicated: Secondary | ICD-10-CM | POA: Diagnosis not present

## 2018-10-16 DIAGNOSIS — A310 Pulmonary mycobacterial infection: Secondary | ICD-10-CM | POA: Diagnosis not present

## 2018-10-16 DIAGNOSIS — Z5181 Encounter for therapeutic drug level monitoring: Secondary | ICD-10-CM | POA: Diagnosis not present

## 2018-10-16 LAB — COMPLETE METABOLIC PANEL WITH GFR
AG RATIO: 1.2 (calc) (ref 1.0–2.5)
ALBUMIN MSPROF: 3.8 g/dL (ref 3.6–5.1)
ALKALINE PHOSPHATASE (APISO): 63 U/L (ref 37–153)
ALT: 10 U/L (ref 6–29)
AST: 16 U/L (ref 10–35)
BILIRUBIN TOTAL: 0.3 mg/dL (ref 0.2–1.2)
BUN: 12 mg/dL (ref 7–25)
CHLORIDE: 105 mmol/L (ref 98–110)
CO2: 28 mmol/L (ref 20–32)
Calcium: 9.2 mg/dL (ref 8.6–10.4)
Creat: 0.62 mg/dL (ref 0.60–0.93)
GFR, Est African American: 99 mL/min/{1.73_m2} (ref >=60)
GFR, Est Non African American: 86 mL/min/{1.73_m2} (ref >=60)
GLOBULIN: 3.1 g/dL (ref 1.9–3.7)
GLUCOSE: 86 mg/dL (ref 65–99)
POTASSIUM: 4.9 mmol/L (ref 3.5–5.3)
SODIUM: 139 mmol/L (ref 135–146)
Total Protein: 6.9 g/dL (ref 6.1–8.1)

## 2018-10-16 LAB — CBC WITH DIFFERENTIAL/PLATELET
Absolute Monocytes: 540 cells/uL (ref 200–950)
BASOS ABS: 57 {cells}/uL (ref 0–200)
Basophils Relative: 0.8 %
EOS ABS: 199 {cells}/uL (ref 15–500)
Eosinophils Relative: 2.8 %
HEMATOCRIT: 37.1 % (ref 35.0–45.0)
HEMOGLOBIN: 12.7 g/dL (ref 11.7–15.5)
Lymphs Abs: 2556 cells/uL (ref 850–3900)
MCH: 30.4 pg (ref 27.0–33.0)
MCHC: 34.2 g/dL (ref 32.0–36.0)
MCV: 88.8 fL (ref 80.0–100.0)
MONOS PCT: 7.6 %
MPV: 11.4 fL (ref 7.5–12.5)
NEUTROS ABS: 3749 {cells}/uL (ref 1500–7800)
Neutrophils Relative %: 52.8 %
Platelets: 259 10*3/uL (ref 140–400)
RBC: 4.18 10*6/uL (ref 3.80–5.10)
RDW: 12.3 % (ref 11.0–15.0)
Total Lymphocyte: 36 %
WBC: 7.1 10*3/uL (ref 3.8–10.8)

## 2018-10-16 NOTE — Progress Notes (Signed)
   Subjective:    Patient ID: [name] Rice, female    DOB: 1941-07-13, 78 y.o.   MRN: [id number]  HPI Here for follow up of pulmonary MAC Started three times weekly rifampin, ethambutol, azithromycin and no issues.  Good tolerance with no rash, diarrhea, abdominal pain.  Feels her cough free days have increased and feeling better overall.  Plans to spend time in [place] this summer and asking about long-term medication fills.     Review of Systems  Constitutional: Negative for chills, fatigue and fever.  Gastrointestinal: Negative for diarrhea.  Skin: Negative for rash.       Objective:   Physical Exam Constitutional:      Appearance: Normal appearance.  HENT:     Mouth/Throat:     Pharynx: No posterior oropharyngeal erythema.  Cardiovascular:     Rate and Rhythm: Normal rate and regular rhythm.     Heart sounds: No murmur.  Pulmonary:     Effort: Pulmonary effort is normal.     Breath sounds: Normal breath sounds.  Skin:    Findings: No rash.  Neurological:     Mental Status: She is alert.    SH: no tobacco       Assessment & Plan:

## 2018-10-16 NOTE — Assessment & Plan Note (Signed)
She continues to use her inhaler as needed but has been less often

## 2018-10-16 NOTE — Assessment & Plan Note (Signed)
Doing well on her regimen.  Will continue and check her sputum again at her next visit in 3 months.

## 2018-10-16 NOTE — Assessment & Plan Note (Signed)
Will check a cmp and cbc on her medications.  No eye/vision issues.

## 2018-10-18 DIAGNOSIS — Z0000 Encounter for general adult medical examination without abnormal findings: Secondary | ICD-10-CM | POA: Diagnosis not present

## 2018-10-18 DIAGNOSIS — J479 Bronchiectasis, uncomplicated: Secondary | ICD-10-CM | POA: Diagnosis not present

## 2018-10-18 DIAGNOSIS — Z1389 Encounter for screening for other disorder: Secondary | ICD-10-CM | POA: Diagnosis not present

## 2018-11-01 MED FILL — AZITHROMYCIN 500 MG TABLET: 500 | 28 days supply | Qty: 12 | Fill #2

## 2018-11-01 MED FILL — rifAMPin 300 MG CAPS: 300 | 28 days supply | Qty: 24 | Fill #2

## 2018-11-01 MED FILL — ETHAMBUTOL HCL 400 MG TAB: 400 | 28 days supply | Qty: 36 | Fill #2

## 2018-11-02 ENCOUNTER — Telehealth: Payer: Self-pay

## 2018-11-02 ENCOUNTER — Encounter: Payer: Self-pay | Admitting: Internal Medicine

## 2018-11-02 NOTE — Telephone Encounter (Signed)
Patient called office requesting a letter for her insurance stating she is fit to travel. Patient states that she spoke with Dr. Linus Salmons about this in her last visit. Patient does not plan to travel until September 2020, but would like letter as soon as possible to avoid any delays. [place]

## 2018-11-26 MED FILL — ETHAMBUTOL HCL 400 MG TAB: 400 | 28 days supply | Qty: 36 | Fill #3

## 2018-11-26 MED FILL — rifAMPin 300 MG CAPS: 300 | 28 days supply | Qty: 24 | Fill #3

## 2018-11-26 MED FILL — AZITHROMYCIN 500 MG TABLET: 500 | 28 days supply | Qty: 12 | Fill #3

## 2018-11-27 ENCOUNTER — Telehealth: Payer: Self-pay | Admitting: *Deleted

## 2018-11-27 MED ORDER — ALBUTEROL SULFATE HFA 108 (90 BASE) MCG/ACT IN AERS: 2.0000 | INHALATION_SPRAY | Freq: Four times a day (QID) | RESPIRATORY_TRACT | 1 refills | Status: DC | PRN

## 2018-11-27 NOTE — Addendum Note (Signed)
Addended by: Reggy Eye on: 11/27/2018 05:17 PM   Modules accepted: Orders

## 2018-11-27 NOTE — Telephone Encounter (Signed)
It is ok to refill, though she should get future refills from the prescribing physician.  It did not come from me. thnaks

## 2018-11-27 NOTE — Telephone Encounter (Signed)
Patient aware of the refill and advised will get others from her pcp.

## 2018-11-27 NOTE — Telephone Encounter (Signed)
Patient called to advise she is out of her ventolin inhaler and only got samples when she received it. She would like for Dr Linus Salmons to send in a refill for the inhaler if possible. Advised her not sure but can ask and give her a call back once he responds.

## 2018-12-24 MED FILL — ETHAMBUTOL HCL 400 MG TAB: 400 | 28 days supply | Qty: 36 | Fill #4

## 2018-12-24 MED FILL — rifAMPin 300 MG CAPS: 300 | 28 days supply | Qty: 24 | Fill #4

## 2018-12-24 MED FILL — AZITHROMYCIN 500 MG TABS: 500 | 28 days supply | Qty: 12 | Fill #4

## 2018-12-31 ENCOUNTER — Telehealth: Payer: Self-pay | Admitting: Adult Health

## 2018-12-31 NOTE — Telephone Encounter (Signed)
R/s appt per 4/17 sch message - unable to reach patient - left message with new apt date and time

## 2019-01-11 ENCOUNTER — Telehealth: Payer: Self-pay | Admitting: Adult Health

## 2019-01-11 NOTE — Telephone Encounter (Signed)
Spoke with pt regarding her [employer] visit - they stated that they do not have the capability to do it. They would request a phone call visit.

## 2019-01-14 ENCOUNTER — Inpatient Hospital Stay: Payer: Medicare Other | Attending: Adult Health | Admitting: Adult Health

## 2019-01-14 ENCOUNTER — Encounter: Payer: Self-pay | Admitting: Adult Health

## 2019-01-14 DIAGNOSIS — Z1239 Encounter for other screening for malignant neoplasm of breast: Secondary | ICD-10-CM | POA: Diagnosis not present

## 2019-01-14 DIAGNOSIS — C50312 Malignant neoplasm of lower-inner quadrant of left female breast: Secondary | ICD-10-CM

## 2019-01-14 DIAGNOSIS — Z171 Estrogen receptor negative status [ER-]: Secondary | ICD-10-CM | POA: Diagnosis not present

## 2019-01-14 NOTE — Progress Notes (Signed)
SURVIVORSHIP VIRTUAL VISIT:  I connected with [name] Rice  on 01/14/19 at  1:30 PM EDT by telephone and verified that I am speaking with the correct person using two identifiers.   I discussed the limitations, risks, security and privacy concerns of performing an evaluation and management service by telephone and the availability of in person appointments. I also discussed with the patient that there may be a patient responsible charge related to this service. The patient expressed understanding and agreed to proceed.     REASON FOR VISIT:  Routine follow-up for history of breast cancer.   BRIEF ONCOLOGIC HISTORY:    Malignant neoplasm of lower-inner quadrant of left breast in female, estrogen receptor negative ([place])   10/22/2007 Initial Diagnosis    Invasive ductal carcinoma left breast T1c N0 stage I ER 0%, PR 0%, HER-2 negative, wide excision 10/22/2007, reexcision 11/05/2007 and 11/29/2007; adjuvant chemo dose dense AC x4 completed 01/22/2008, adjuvant radiation 4256 cGy and 1000 cGy boost completed 03/10/2008      INTERVAL HISTORY:  [name] Rice [facility] for routine follow-up for her history of breast cancer.  Overall, she reports feeling quite well.   Since her last visit she underwent bilateral mammogram in 02/2018 and it was normal showing no evidence of malignancy and breast density category B. She was also diangosed with MAC at the beginning of this year and will be on antibiotics for one year.  She is following up with Dr. Elsworth Soho and Dr. Dairl Ponder for this.    [name] Rice has no breast concerns.  She sees her PCP regularly.  She has been released from gyn screening and colon cancer screening.  She says her husband checks her skin and lets her know if there are any problems.  She is exercising regularly, and eats well.      REVIEW OF SYSTEMS:  Review of Systems  Constitutional: Negative for appetite change, chills, fatigue, fever and unexpected weight change.   HENT:   Negative for hearing loss, mouth sores, nosebleeds and sore throat.   Eyes: Negative for eye problems and icterus.  Respiratory: Negative for chest tightness, cough and shortness of breath.   Cardiovascular: Negative for chest pain, leg swelling and palpitations.  Gastrointestinal: Negative for abdominal distention, abdominal pain, constipation, diarrhea, nausea and vomiting.  Endocrine: Negative for hot flashes.  Genitourinary: Negative for difficulty urinating.   Musculoskeletal: Negative for arthralgias.  Skin: Negative for itching and rash.  Neurological: Negative for dizziness, extremity weakness, headaches and numbness.  Hematological: Negative for adenopathy. Does not bruise/bleed easily.  Psychiatric/Behavioral: Negative for depression. The patient is not nervous/anxious.   Breast: Denies any new nodularity, masses, tenderness, nipple changes, or nipple discharge.       PAST MEDICAL/SURGICAL HISTORY:  Past Medical History:  Diagnosis Date  . Cancer ([place])    remission- breast  . Glaucoma    Past Surgical History:  Procedure Laterality Date  . BLADDER REPAIR    . BREAST LUMPECTOMY Left 2009     ALLERGIES:  Allergies  Allergen Reactions  . Apraclonidine Other (See Comments)    Redness of eye     CURRENT MEDICATIONS:  Outpatient Encounter Medications as of 01/14/2019  Medication Sig  . albuterol (PROVENTIL HFA;VENTOLIN HFA) 108 (90 Base) MCG/ACT inhaler Inhale 2 puffs into the lungs every 6 (six) hours as needed for wheezing or shortness of breath.  Marland Kitchen azithromycin (ZITHROMAX) 500 MG tablet Take 1 tablet (500 mg total) by mouth 3 (three) times a  week.  . AZOPT 1 % ophthalmic suspension PUT 1 DROP INTO BOTH EYES TWICE A DAY  . Cholecalciferol (VITAMIN D3) 1000 units CAPS Take 1 capsule by mouth daily.   Marland Kitchen ethambutol (MYAMBUTOL) 400 MG tablet Take 3 tablets (1,200 mg total) by mouth 3 (three) times a week.  . latanoprost (XALATAN) 0.005 % ophthalmic solution  Place 1 drop into both eyes at bedtime.   Marland Kitchen latanoprost (XALATAN) 0.005 % ophthalmic solution Place 1 drop into the left eye daily.  Marland Kitchen PRESCRIPTION MEDICATION Place 1 drop into the left eye daily. Called Rhopressa per patient  . Respiratory Therapy Supplies (FLUTTER) DEVI 1 Device by Does not apply route 3 (three) times daily.  . rifampin (RIFADIN) 300 MG capsule Take 2 capsules (600 mg total) by mouth 3 (three) times a week.   No facility-administered encounter medications on file as of 01/14/2019.      ONCOLOGIC FAMILY HISTORY:  Non contributory  GENETIC COUNSELING/TESTING: Not at this time  SOCIAL HISTORY:  Social History   Socioeconomic History  . Marital status: Married    Spouse name: Not on file  . Number of children: Not on file  . Years of education: Not on file  . Highest education level: Not on file  Occupational History  . Not on file  Social Needs  . Financial resource strain: Not on file  . Food insecurity:    Worry: Not on file    Inability: Not on file  . Transportation needs:    Medical: Not on file    Non-medical: Not on file  Tobacco Use  . Smoking status: Former Smoker    Years: 5.00    Types: Cigarettes  . Smokeless tobacco: Never Used  Substance and Sexual Activity  . Alcohol use: Yes  . Drug use: No  . Sexual activity: Not on file  Lifestyle  . Physical activity:    Days per week: Not on file    Minutes per session: Not on file  . Stress: Not on file  Relationships  . Social connections:    Talks on phone: Not on file    Gets together: Not on file    Attends religious service: Not on file    Active member of club or organization: Not on file    Attends meetings of clubs or organizations: Not on file    Relationship status: Not on file  . Intimate partner violence:    Fear of current or ex partner: Not on file    Emotionally abused: Not on file    Physically abused: Not on file    Forced sexual activity: Not on file  Other Topics  Concern  . Not on file  Social History Narrative  . Not on file       OBJECTIVE: Speech is normal, no apparent distress, breathing is non labored, behavior and mood are normal.    LABORATORY DATA:  None for this visit   DIAGNOSTIC IMAGING:  Most recent mammogram:     ASSESSMENT AND PLAN:  Ms.. Rice is a pleasant 78 y.o. female with history of Stage I right/left breast invasive ductal carcinoma, ER-/PR-/HER2-, diagnosed in 2009, treated with lumpectomy, adjuvant chemotherapy, and adjuvant radiation therapy.  She [facility] for surveillance and routine follow-up.   1. History of breast cancer:  [name] Rice is currently clinically and radiographically without evidence of disease or recurrence of breast cancer. She will be due for mammogram in 02/2019; orders placed today.  She will f/u PRN.   I encouraged her to call me with any questions or concerns we can certainly see [place] if needed.    2. Bone health:  Given [name] Rice's age, history of breast cancer, she is at risk for bone demineralization.  She was recommended to f/u with her PCP about her bone density testing.  She was given education on specific food and activities to promote bone health.  3. Cancer screening:  Due to [name] Rice's history and her age, she should receive screening for skin cancers. She was encouraged to follow-up with her PCP for appropriate cancer screenings.   4. Health maintenance and wellness promotion: [name] Rice was encouraged to consume 5-7 servings of fruits and vegetables per day. She was also encouraged to engage in moderate to vigorous exercise for 30 minutes per day most days of the week. She was instructed to limit her alcohol consumption and continue to abstain from tobacco use.    Follow up instructions:    -Return to cancer center PRN  -Mammogram due in 02/2019    The patient was provided an opportunity to ask questions and all were answered. The patient  agreed with the plan and demonstrated an understanding of the instructions.   The patient was advised to call back or seek an in-person evaluation if the symptoms worsen or if the condition fails to improve as anticipated.   I provided 10 minutes of non face-to-face telephone visit time during this encounter, and > 50% was spent counseling as documented under my assessment & plan.   Gardenia Phlegm, [place] [phone]   Note: PRIMARY CARE PROVIDER Lavone Orn, [place] [phone]

## 2019-01-15 ENCOUNTER — Encounter: Payer: Medicare Other | Admitting: Adult Health

## 2019-01-21 MED FILL — rifAMPin 300 MG CAPS: 300 | 28 days supply | Qty: 24 | Fill #5

## 2019-01-21 MED FILL — MYAMBUTOL 400 MG TABLET: 400 | 28 days supply | Qty: 36 | Fill #5

## 2019-01-21 MED FILL — AZITHROMYCIN 500 MG TABS: 500 | 28 days supply | Qty: 12 | Fill #5

## 2019-01-22 ENCOUNTER — Encounter: Payer: Medicare Other | Admitting: Adult Health

## 2019-01-29 ENCOUNTER — Other Ambulatory Visit: Payer: Self-pay

## 2019-01-29 ENCOUNTER — Ambulatory Visit (INDEPENDENT_AMBULATORY_CARE_PROVIDER_SITE_OTHER): Payer: Medicare Other | Admitting: Internal Medicine

## 2019-01-29 ENCOUNTER — Encounter: Payer: Self-pay | Admitting: Internal Medicine

## 2019-01-29 VITALS — BP 154/80 | HR 80 | Temp 98.1°F | Wt 126.0 lb

## 2019-01-29 DIAGNOSIS — J479 Bronchiectasis, uncomplicated: Secondary | ICD-10-CM | POA: Diagnosis not present

## 2019-01-29 DIAGNOSIS — Z5181 Encounter for therapeutic drug level monitoring: Secondary | ICD-10-CM | POA: Diagnosis not present

## 2019-01-29 DIAGNOSIS — A310 Pulmonary mycobacterial infection: Secondary | ICD-10-CM

## 2019-01-29 LAB — COMPREHENSIVE METABOLIC PANEL
AG Ratio: 1.3 (calc) (ref 1.0–2.5)
ALT: 10 U/L (ref 6–29)
AST: 16 U/L (ref 10–35)
Albumin: 3.9 g/dL (ref 3.6–5.1)
Alkaline phosphatase (APISO): 63 U/L (ref 37–153)
BUN: 10 mg/dL (ref 7–25)
CO2: 25 mmol/L (ref 20–32)
Calcium: 9 mg/dL (ref 8.6–10.4)
Chloride: 103 mmol/L (ref 98–110)
Creat: 0.7 mg/dL (ref 0.60–0.93)
Globulin: 3.1 g/dL (calc) (ref 1.9–3.7)
Glucose, Bld: 87 mg/dL (ref 65–99)
Potassium: 4.5 mmol/L (ref 3.5–5.3)
Sodium: 137 mmol/L (ref 135–146)
Total Bilirubin: 0.5 mg/dL (ref 0.2–1.2)
Total Protein: 7 g/dL (ref 6.1–8.1)

## 2019-01-29 NOTE — Assessment & Plan Note (Signed)
Will check cmp on medication No vision issues, no associated rash.

## 2019-01-29 NOTE — Assessment & Plan Note (Signed)
Tolerating treatment.  Will continue and recheck sputum.  May consider inhaled amikacin if still present

## 2019-01-29 NOTE — Assessment & Plan Note (Addendum)
Will issues with cough, sob.   Continues to follow with Dr. Elsworth Soho

## 2019-01-29 NOTE — Progress Notes (Signed)
Subjective:    Patient ID: [name] Rice, female    DOB: 19-Mar-1941, 78 y.o.   MRN: [id number]  HPI Here for follow up of pulmonary MAC Started three times weekly rifampin, ethambutol, azithromycin and no issues.  Good tolerance with no rash, diarrhea, abdominal pain.  Feels about the same overall.  Does spend some time gardening but doe not notice any difference gardening or not.  Some days better than others.     Review of Systems  Constitutional: Negative for chills, fatigue and fever.  Gastrointestinal: Negative for diarrhea.  Skin: Negative for rash.       Objective:   Physical Exam Constitutional:      Appearance: Normal appearance.  HENT:     Mouth/Throat:     Pharynx: No posterior oropharyngeal erythema.  Cardiovascular:     Rate and Rhythm: Normal rate and regular rhythm.     Heart sounds: No murmur.  Pulmonary:     Effort: Pulmonary effort is normal.     Breath sounds: Normal breath sounds.  Skin:    Findings: No rash.  Neurological:     Mental Status: She is alert.    SH: no tobacco       Assessment & Plan:

## 2019-01-30 ENCOUNTER — Other Ambulatory Visit: Payer: Medicare Other

## 2019-01-30 DIAGNOSIS — A310 Pulmonary mycobacterial infection: Secondary | ICD-10-CM

## 2019-02-18 ENCOUNTER — Telehealth: Payer: Self-pay

## 2019-02-18 NOTE — Telephone Encounter (Signed)
Results of sputum culture requested.  Patient informed sputum sample is still pending. The preliminary was posted on 02-01-19 . Once we receive the final we will call with results.    Laverle Patter, RN

## 2019-02-20 MED FILL — MYAMBUTOL 400 MG TABLET: 400 | 28 days supply | Qty: 36 | Fill #6

## 2019-02-20 MED FILL — rifAMPin 300 MG CAPS: 300 | 28 days supply | Qty: 24 | Fill #6

## 2019-02-20 MED FILL — AZITHROMYCIN 500 MG TABS: 500 | 28 days supply | Qty: 12 | Fill #6

## 2019-03-11 ENCOUNTER — Ambulatory Visit
Admission: RE | Admit: 2019-03-11 | Discharge: 2019-03-11 | Disposition: A | Discharge status: Home / Self Care | Payer: Medicare Other | Source: Ambulatory Visit | Attending: Adult Health | Admitting: Adult Health

## 2019-03-11 ENCOUNTER — Other Ambulatory Visit: Payer: Self-pay

## 2019-03-11 DIAGNOSIS — Z1231 Encounter for screening mammogram for malignant neoplasm of breast: Secondary | ICD-10-CM | POA: Diagnosis not present

## 2019-03-11 DIAGNOSIS — Z1239 Encounter for other screening for malignant neoplasm of breast: Secondary | ICD-10-CM

## 2019-03-11 DIAGNOSIS — Z171 Estrogen receptor negative status [ER-]: Secondary | ICD-10-CM

## 2019-03-11 DIAGNOSIS — C50312 Malignant neoplasm of lower-inner quadrant of left female breast: Secondary | ICD-10-CM

## 2019-03-12 IMAGING — DX DG CHEST 2V
2 series · 2 of 2 positions shown · non-contrast
Comparison: 08/27/2017

CLINICAL DATA: Productive cough for several months

EXAM:
CHEST - 2 VIEW

[chest pa]
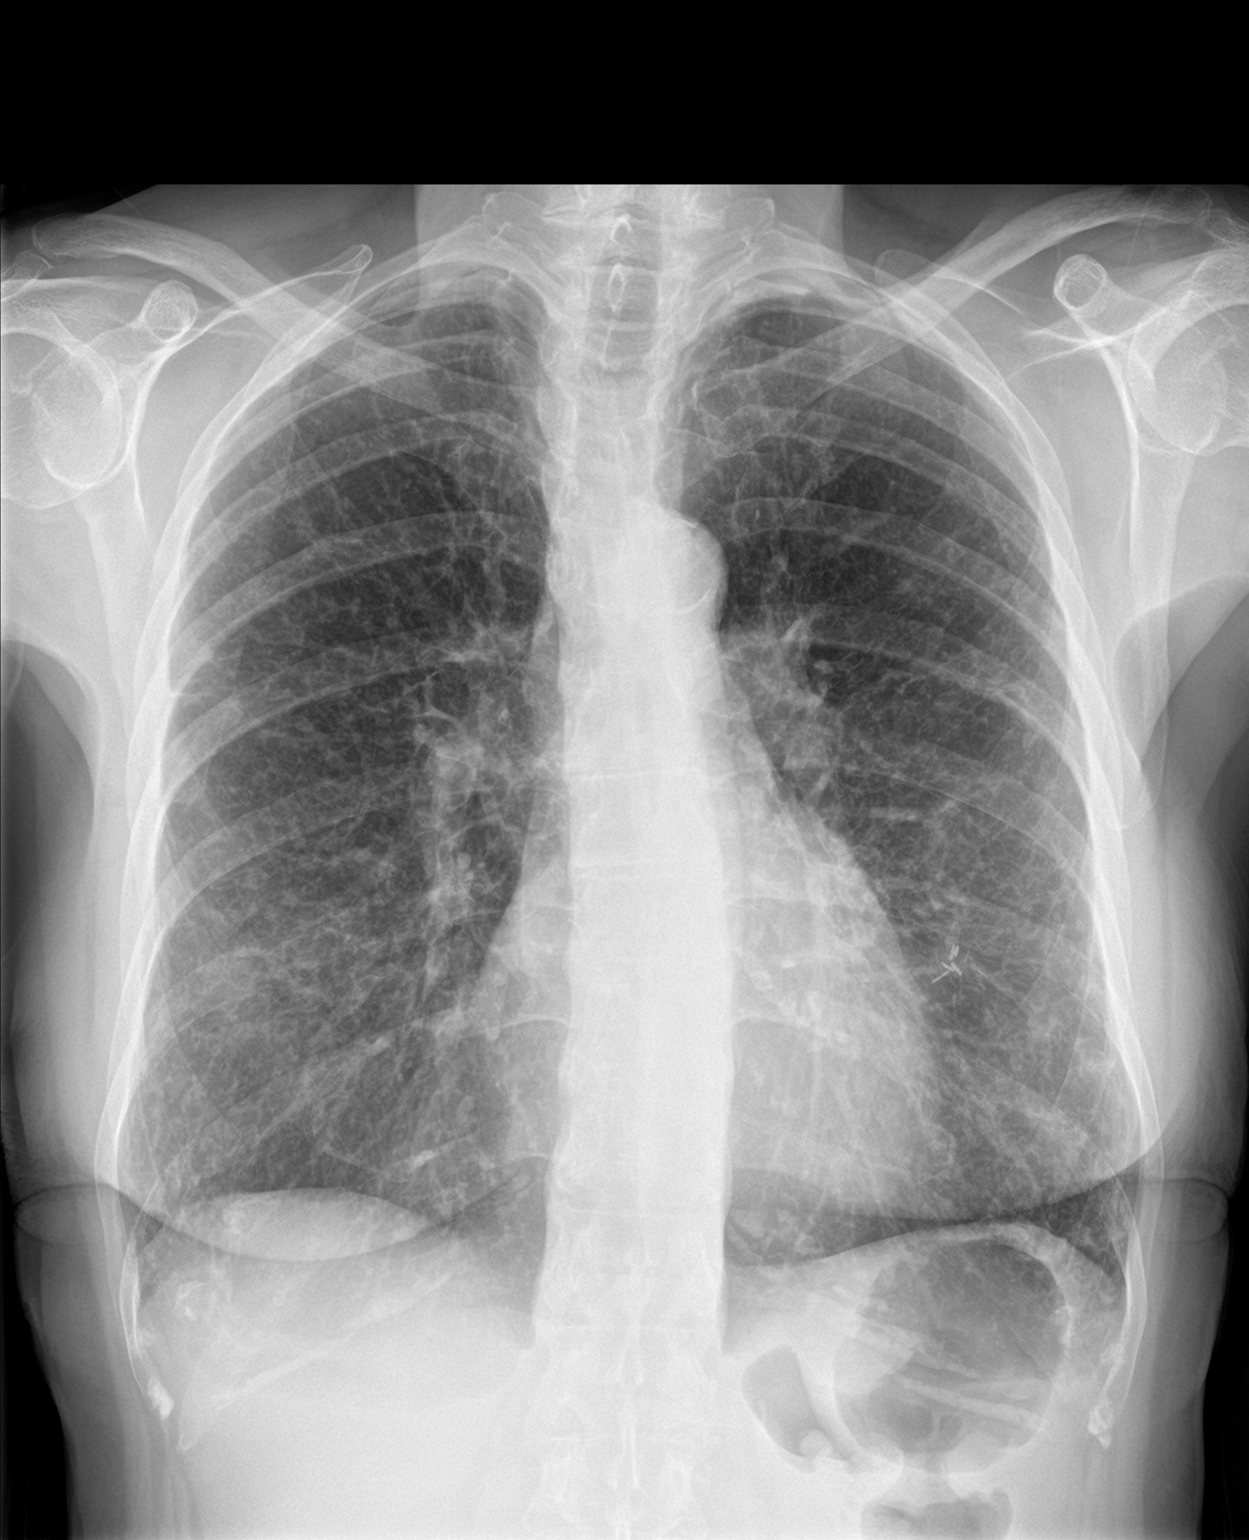

[chest lat]
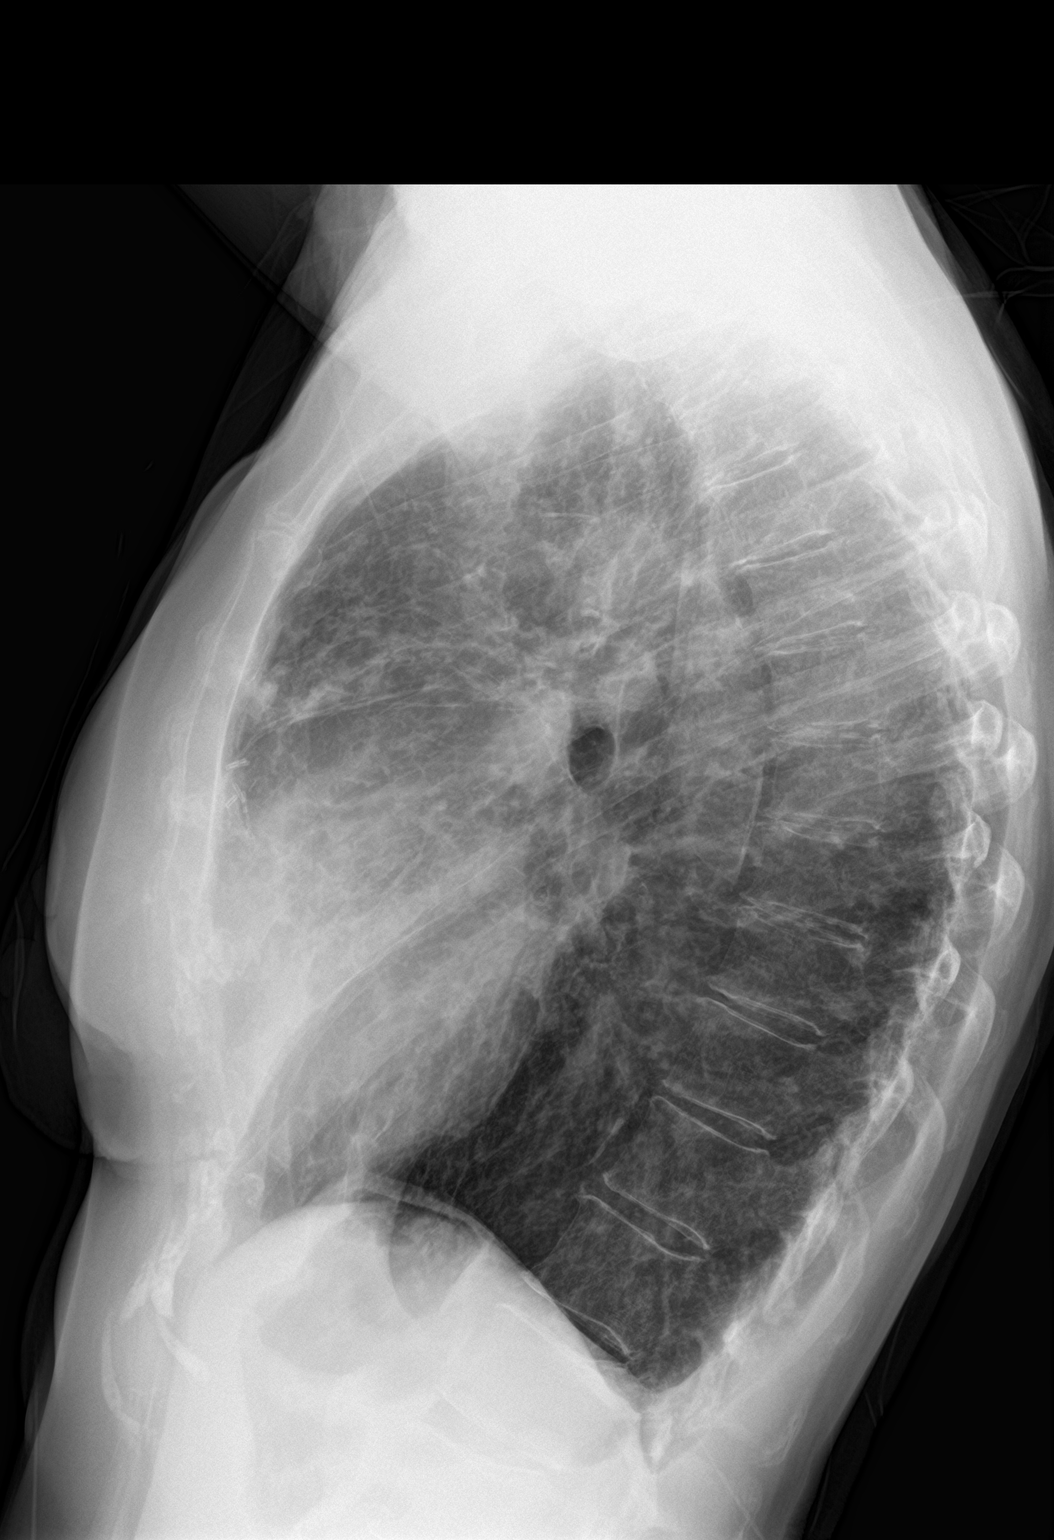

[2 of 2 positions shown; findings below may reference images not displayed]

FINDINGS: Cardiac shadow is stable. Aortic calcifications are again seen.
Chronic interstitial changes are again identified bilaterally
similar to that seen on the prior exam. No focal infiltrate or
sizable effusion is noted. Hyperinflation is seen stable from the
prior study. No acute bony abnormality is noted.
IMPRESSION: Chronic interstitial changes bilaterally stable from the prior exam.
No acute abnormality is seen.

Aortic Atherosclerosis (NGSIA-PVB.B) and Emphysema (NGSIA-OKK.M).

## 2019-03-16 LAB — MYCOBACTERIA,CULT W/FLUOROCHROME SMEAR
MICRO NUMBER:: 493483
SMEAR:: NONE SEEN
SPECIMEN QUALITY:: ADEQUATE

## 2019-03-25 ENCOUNTER — Telehealth: Payer: Self-pay

## 2019-03-25 MED FILL — MYAMBUTOL 400 MG TABLET: 400 | 28 days supply | Qty: 36 | Fill #7

## 2019-03-25 MED FILL — AZITHROMYCIN 500 MG TABS: 500 | 28 days supply | Qty: 12 | Fill #7

## 2019-03-25 MED FILL — rifAMPin 300 MG CAPS: 300 | 28 days supply | Qty: 24 | Fill #7

## 2019-03-25 NOTE — Telephone Encounter (Signed)
Patient made aware of negative results and to continue treatment as before per Dr. Linus Salmons. Patient was very appreciative of call.  Eugenia Mcalpine, LPN

## 2019-03-25 NOTE — Progress Notes (Signed)
_0  ID: [name] Rice, female    DOB: 19-Apr-1941, 78 y.o.   MRN: [id number]  Chief Complaint  Patient presents with   Cough    Per patient is worse in evening. Specially when sitting in living room on the couch.    Referring provider: Lavone Orn, MD  HPI: 78 year old patient, former smoker. PMH bronchiectasis d/t mycobacterium avium complex. Patient of Dr. Elsworth Soho, last seen on 08/23/18. Sputum culture positive for MAIC. AFB negative. Needs HRCT in December. HRCT showed clear disease progression since 2018. Referred to ID for treatment. Needs periodic CT to monitor. Advised mucinex and hypertonic saline neb in the future if needed.   Previous [place] pulmonary encounter: 05/10/2018 Patient presents today with complaints of lingering cough x 3 weeks. Mostly dry, occ productive with white bubbly sputum. Reports coughing up green mucus last night. Given Omnicef to take with her on [place] in three [place]. She is nervous about getting sick while away and having another episode of hemoptysis. Using cough drops, she has picked up delsym but doesn't like liquid medications. Needs refill of ventolin rescue inhaler, states that its old. Denies fever, sob, N/V/D, night sweats.   03/26/2019 Patient presents today for regular follow-up. Complains of worsening cough. Last seen by ID on 5/19 for follow-up mycobacterium avium complex. Started on three times weekly rifampim, ethambutol and azithromycin. Tolerating triple therapy. Has follow-up in August.   Congestion is worse in the evening, notices cough after dinner time while sitting on the couch. She started taking mucinex once a day in November. States that her husband thinks mucinex has been helpful. Congestion is mainly in the chest, described as frothy with tinge of grey. Recent sputum culture negative for Mycobacterium. Reports that there has been a couple of nights when she had orange juice that she noticed a lot of reflux symptoms. No  fever.   Allergies  Allergen Reactions   Apraclonidine Other (See Comments)    Redness of eye    Immunization History  Administered Date(s) Administered   Influenza Whole 08/30/2007, 06/26/2017   Pneumococcal Conjugate-13 09/26/2014   Pneumococcal Polysaccharide-23 09/03/2007    Past Medical History:  Diagnosis Date   Cancer ([place])    remission- breast   Glaucoma     Tobacco History: Social History   Tobacco Use  Smoking Status Former Smoker   Years: 5.00   Types: Cigarettes  Smokeless Tobacco Never Used   Counseling given: Not Answered   Outpatient Medications Prior to Visit  Medication Sig Dispense Refill   albuterol (PROVENTIL HFA;VENTOLIN HFA) 108 (90 Base) MCG/ACT inhaler Inhale 2 puffs into the lungs every 6 (six) hours as needed for wheezing or shortness of breath. 1 Inhaler 1   azithromycin (ZITHROMAX) 500 MG tablet Take 1 tablet (500 mg total) by mouth 3 (three) times a week. 12 tablet 11   AZOPT 1 % ophthalmic suspension PUT 1 DROP INTO BOTH EYES TWICE A DAY  11   Cholecalciferol (VITAMIN D3) 1000 units CAPS Take 1 capsule by mouth daily.      ethambutol (MYAMBUTOL) 400 MG tablet Take 3 tablets (1,200 mg total) by mouth 3 (three) times a week. 36 tablet 11   guaiFENesin (MUCINEX) 600 MG 12 hr tablet Take 600 mg by mouth daily.     latanoprost (XALATAN) 0.005 % ophthalmic solution Place 1 drop into both eyes at bedtime.      latanoprost (XALATAN) 0.005 % ophthalmic solution Place 1 drop into the left eye daily.  PRESCRIPTION MEDICATION Place 1 drop into the left eye daily. Called Rhopressa per patient     RHOPRESSA 0.02 % SOLN Apply 1 drop to eye daily. In left eye only     rifampin (RIFADIN) 300 MG capsule Take 2 capsules (600 mg total) by mouth 3 (three) times a week. 24 capsule 11   Respiratory Therapy Supplies (FLUTTER) DEVI 1 Device by Does not apply route 3 (three) times daily. (Patient not taking: Reported on 03/26/2019) 1 each 0    No facility-administered medications prior to visit.    Review of Systems  Review of Systems  Constitutional: Negative.   HENT: Positive for congestion.   Respiratory: Positive for cough. Negative for shortness of breath and wheezing.   Cardiovascular: Negative.    Physical Exam  BP 126/70 (BP Location: Left Arm, Patient Position: Sitting, Cuff Size: Normal)    Pulse 92    Temp 98.3 F (36.8 C)    Ht 5' 3.5" (1.613 m)    Wt 124 lb (56.2 kg)    SpO2 95%    BMI 21.62 kg/m  Physical Exam Constitutional:      Appearance: Normal appearance.  HENT:     Right Ear: Tympanic membrane normal.     Left Ear: Tympanic membrane normal.     Mouth/Throat:     Mouth: Mucous membranes are moist.     Pharynx: Oropharynx is clear.  Cardiovascular:     Rate and Rhythm: Normal rate and regular rhythm.  Pulmonary:     Effort: Pulmonary effort is normal. No respiratory distress.     Breath sounds: No stridor. Rales present. No wheezing or rhonchi.  Skin:    General: Skin is warm and dry.  Neurological:     General: No focal deficit present.     Mental Status: She is alert and oriented to person, place, and time. Mental status is at baseline.  Psychiatric:        Mood and Affect: Mood normal.        Behavior: Behavior normal.        Thought Content: Thought content normal.        Judgment: Judgment normal.      Lab Results:  CBC    Component Value Date/Time   WBC 7.1 10/16/2018 1124   RBC 4.18 10/16/2018 1124   HGB 12.7 10/16/2018 1124   HCT 37.1 10/16/2018 1124   PLT 259 10/16/2018 1124   MCV 88.8 10/16/2018 1124   MCH 30.4 10/16/2018 1124   MCHC 34.2 10/16/2018 1124   RDW 12.3 10/16/2018 1124   LYMPHSABS 2,556 10/16/2018 1124   MONOABS 0.5 08/27/2017 1411   EOSABS 199 10/16/2018 1124   BASOSABS 57 10/16/2018 1124    BMET    Component Value Date/Time   NA 137 01/29/2019 1118   K 4.5 01/29/2019 1118   CL 103 01/29/2019 1118   CO2 25 01/29/2019 1118   GLUCOSE 87  01/29/2019 1118   BUN 10 01/29/2019 1118   CREATININE 0.70 01/29/2019 1118   CALCIUM 9.0 01/29/2019 1118   GFRNONAA 86 10/16/2018 1124   GFRAA 99 10/16/2018 1124    BNP No results found for: BNP  ProBNP No results found for: PROBNP  Imaging: Mm 3d Screen Breast Bilateral  Result Date: 03/12/2019 CLINICAL DATA:  Screening. EXAM: DIGITAL SCREENING BILATERAL MAMMOGRAM WITH TOMO AND CAD COMPARISON:  Previous exam(s). ACR Breast Density Category b: There are scattered areas of fibroglandular density. FINDINGS: There are no findings suspicious for malignancy.  Images were processed with CAD. IMPRESSION: No mammographic evidence of malignancy. A result letter of this screening mammogram will be mailed directly to the patient. RECOMMENDATION: Screening mammogram in one year. (Code:SM-B-01Y) BI-RADS CATEGORY  1: Negative. Electronically Signed   By: Kristopher Oppenheim M.D.   On: 03/12/2019 10:02     Assessment & Plan:   Pulmonary Mycobacterium avium complex (MAC) infection ([place]) - Started on three times weekly rifampim, ethambutol and azithromycin - Following with ID - Most recent sputum culture showed no mycobacterium after 6 weeks incubation   Bronchiectasis without complication ([facility]) - Recommend increasing mucinex 690m TWICE daily  - Add flonase nasal spray once daily  - Advised flutter valve 3-4 times a day  - If no improvement, recommend hypertonic saline neb - FU in 4 weeks   GERD (gastroesophageal reflux disease) - GERD symptoms with acidic foods - Cough worse at night - Recommend trial omeprazole 214mdaily    ElMartyn EhrichNP 03/26/2019

## 2019-03-25 NOTE — Telephone Encounter (Signed)
-----  Message from Thayer Headings, MD sent at 03/25/2019  8:34 AM EDT ----- Please let her know her recent sputum for MAI has remained negative at 6 weeks so is a good sign.  Continue treatment as before. thanks

## 2019-03-26 ENCOUNTER — Other Ambulatory Visit: Payer: Self-pay

## 2019-03-26 ENCOUNTER — Ambulatory Visit (INDEPENDENT_AMBULATORY_CARE_PROVIDER_SITE_OTHER): Payer: Medicare Other | Admitting: Primary Care

## 2019-03-26 ENCOUNTER — Encounter: Payer: Self-pay | Admitting: Primary Care

## 2019-03-26 VITALS — BP 126/70 | HR 92 | Temp 98.3°F | Ht 63.5 in | Wt 124.0 lb

## 2019-03-26 DIAGNOSIS — K219 Gastro-esophageal reflux disease without esophagitis: Secondary | ICD-10-CM

## 2019-03-26 DIAGNOSIS — A310 Pulmonary mycobacterial infection: Secondary | ICD-10-CM | POA: Diagnosis not present

## 2019-03-26 DIAGNOSIS — J479 Bronchiectasis, uncomplicated: Secondary | ICD-10-CM

## 2019-03-26 NOTE — Assessment & Plan Note (Signed)
-  Recommend increasing mucinex 667m TWICE daily  - Add flonase nasal spray once daily  - Advised flutter valve 3-4 times a day  - If no improvement, recommend hypertonic saline neb - FU in 4 weeks

## 2019-03-26 NOTE — Assessment & Plan Note (Signed)
-  GERD symptoms with acidic foods - Cough worse at night - Recommend trial omeprazole 28m daily

## 2019-03-26 NOTE — Assessment & Plan Note (Signed)
-  Started on three times weekly rifampim, ethambutol and azithromycin - Following with ID - Most recent sputum culture showed no mycobacterium after 6 weeks incubation

## 2019-03-26 NOTE — Patient Instructions (Addendum)
Start Protonix (omperazole) once daily- take 30 mins before breakfast  Start Flonase nasal spray- 1 spray per nostril once daily   Take mucinex 653m twice daily   Use flutter valve 3-4 times a day when able   Follow-up in 4-6 weeks with NP (can be televist)

## 2019-03-27 LAB — RESPIRATORY ALLERGY PROFILE REGION II ~~LOC~~
Allergen, A. alternata, m6: <0.1 kU/L
Allergen, Cedar tree, t12: 0.3 kU/L — ABNORMAL HIGH
Allergen, Comm Silver Birch, t9: 5.72 kU/L — ABNORMAL HIGH
Allergen, Cottonwood, t14: 0.54 kU/L — ABNORMAL HIGH
Allergen, D pternoyssinus,d7: <0.1 kU/L
Allergen, Mouse Urine Protein, e78: <0.1 kU/L
Allergen, Mulberry, t76: <0.1 kU/L
Allergen, Oak,t7: 3.3 kU/L — ABNORMAL HIGH
Allergen, P. notatum, m1: <0.1 kU/L
Aspergillus fumigatus, m3: <0.1 kU/L
Bermuda Grass: 1.26 kU/L — ABNORMAL HIGH
Box Elder IgE: 1.31 kU/L — ABNORMAL HIGH
CLADOSPORIUM HERBARUM (M2) IGE: <0.1 kU/L
COMMON RAGWEED (SHORT) (W1) IGE: 4.37 kU/L — ABNORMAL HIGH
Cat Dander: <0.1 kU/L
Class: 0
Class: 0
Class: 0
Class: 0
Class: 0
Class: 0
Class: 0
Class: 0
Class: 0
Class: 0
Class: 0
Class: 0
Class: 1
Class: 2
Class: 2
Class: 2
Class: 2
Class: 2
Class: 2
Class: 2
Class: 2
Class: 2
Class: 3
Class: 3
Cockroach: <0.1 kU/L
D. farinae: <0.1 kU/L
Dog Dander: 0.33 kU/L — ABNORMAL HIGH
Elm IgE: 1.32 kU/L — ABNORMAL HIGH
IgE (Immunoglobulin E), Serum: 52 kU/L (ref <=114)
Johnson Grass: 0.87 kU/L — ABNORMAL HIGH
Pecan/Hickory Tree IgE: 1.27 kU/L — ABNORMAL HIGH
Rough Pigweed  IgE: 0.81 kU/L — ABNORMAL HIGH
Sheep Sorrel IgE: 1.46 kU/L — ABNORMAL HIGH
Timothy Grass: 1.54 kU/L — ABNORMAL HIGH

## 2019-03-27 LAB — ALLERGY PANEL 11, MOLD GROUP
Allergen, A. alternata, m6: <0.1 kU/L
Allergen, Mucor Racemosus, M4: <0.1 kU/L
Aspergillus fumigatus, m3: <0.1 kU/L
CLADOSPORIUM HERBARUM (M2) IGE: <0.1 kU/L
CLASS: 0
CLASS: 0
Candida Albicans: <0.1 kU/L
Class: 0
Class: 0
Class: 0

## 2019-03-27 LAB — INTERPRETATION:

## 2019-03-28 NOTE — Progress Notes (Signed)
Please let patient know mold panel was normal. She has multiple respiratory allergens. Please print and send copy to patient. May benefit from daily antihistamine if not taking already.

## 2019-04-09 DIAGNOSIS — H401112 Primary open-angle glaucoma, right eye, moderate stage: Secondary | ICD-10-CM | POA: Diagnosis not present

## 2019-04-09 DIAGNOSIS — H401123 Primary open-angle glaucoma, left eye, severe stage: Secondary | ICD-10-CM | POA: Diagnosis not present

## 2019-04-19 MED FILL — rifAMPin 300 MG CAPS: 300 | 28 days supply | Qty: 24 | Fill #8

## 2019-04-19 MED FILL — AZITHROMYCIN 500 MG TABS: 500 | 28 days supply | Qty: 12 | Fill #8

## 2019-04-19 MED FILL — MYAMBUTOL 400 MG TABLET: 400 | 28 days supply | Qty: 36 | Fill #8

## 2019-04-23 ENCOUNTER — Other Ambulatory Visit: Payer: Self-pay

## 2019-04-23 ENCOUNTER — Ambulatory Visit (INDEPENDENT_AMBULATORY_CARE_PROVIDER_SITE_OTHER): Payer: Medicare Other | Admitting: Primary Care

## 2019-04-23 ENCOUNTER — Encounter: Payer: Self-pay | Admitting: Primary Care

## 2019-04-23 DIAGNOSIS — J479 Bronchiectasis, uncomplicated: Secondary | ICD-10-CM

## 2019-04-23 DIAGNOSIS — J309 Allergic rhinitis, unspecified: Secondary | ICD-10-CM

## 2019-04-23 MED ORDER — MONTELUKAST SODIUM 10 MG PO TABS: 10.0000 mg | ORAL_TABLET | Freq: Every day | ORAL | 1 refills | Status: DC

## 2019-04-23 NOTE — Patient Instructions (Addendum)
Allergic rhinitis: Adding Singulair - take at bedtime  Continue generic antihistamine - take in the morning if non drowsy    Bronchiectasis: Continue Mucinex twice daily Continue flutter valve 3-4 times a day If congestion worsens, call office and we will sending in hypertonic saline nebs   Follow-up: 3-4 moths with Dr. Elsworth Soho  Continue to follow with infectious disease

## 2019-04-23 NOTE — Progress Notes (Signed)
Virtual Visit via Telephone Note  I connected with [name] Rice on 04/23/19 at  9:30 AM EDT by telephone and verified that I am speaking with the correct person using two identifiers.  Location: Patient: Home Provider: Office   I discussed the limitations, risks, security and privacy concerns of performing an evaluation and management service by telephone and the availability of in person appointments. I also discussed with the patient that there may be a patient responsible charge related to this service. The patient expressed understanding and agreed to proceed.   History of Present Illness: 78 year old patient, former smoker. PMH bronchiectasis d/t mycobacterium avium complex. Patient of Dr. Elsworth Soho, last seen on 08/23/18. Sputum culture positive for MAIC. AFB negative. Needs HRCT in December. HRCT showed clear disease progression since 2018. Referred to ID for treatment. Needs periodic CT to monitor. Advised mucinex and hypertonic saline neb in the future if needed.   Previous [facility] encounter: 05/10/2018 Patient presents today with complaints of lingering cough x 3 weeks. Mostly dry, occ productive with white bubbly sputum. Reports coughing up green mucus last night. Given Omnicef to take with her on trip [place] in three weeks. She is nervous about getting sick while away and having another episode of hemoptysis. Using cough drops, she has picked up delsym but doesn't like liquid medications. Needs refill of ventolin rescue inhaler, states that its old. Denies fever, sob, N/V/D, night sweats.   03/26/2019 Patient presents today for regular follow-up. Complains of worsening cough. Last seen by ID on 5/19 for follow-up mycobacterium avium complex. Started on three times weekly rifampim, ethambutol and azithromycin. Tolerating triple therapy. Has follow-up in August.  Congestion is worse in the evening, notices cough after dinner time while sitting on the couch. She started taking  mucinex once a day in November. States that her husband thinks mucinex has been helpful. Congestion is mainly in the chest, described as frothy with tinge of grey. Recent sputum culture negative for Mycobacterium. Reports that there has been a couple of nights when she had orange juice that she noticed a lot of reflux symptoms. No fever. Recommended increased mucinex to twice daily, adding flonase nasal spray and using flutter valve 3-4 times a day. Also recommended trying omeprazole 39m daily for GERD symptoms.   04/23/2019 Patient contacted today for televisit 4 week follow-up. Congestion varies day to day. Some days she thinks "wow it's getting better" and then other days she has a bad day. Taking mucinex twice a day and using flutter valve. She has not tried flonase nasal spray yet. She gets some shortness of breath with coughing. Using rescue inhaler three times a day. Allergy panel positive for various allergens including dogs, birch, oak, ragweed. Daughter has a dog, she often takes her for walks. She is taking generic zyrtec. She has tried Singulair in the past and found it to be helpful.    Observations/Objective:  - No shortness of breath, wheezing or cough noted during phone conversation  Assessment and Plan:  Bronchiectasis - Some improvement, symptoms are quiet variable   - Continue mucinex twice daily and flutter valve  - Continue albuterol hfa 2 puffs every 6 hours prn shortness of breath - Patient will call office if congestions worsens and will try hypertonic saline nebs  Allergic rhinitis - Allergy panel significantly positive for multiple allergens - Continue generic Zyrtec daily  - Add Singulair 125mat bedtime   Pulmonary Mycobacterium avium complex  - Started on three times weekly  rifampim, ethambutol and azithromycin - Following with ID - Most recent sputum culture showed no mycobacterium after 6 weeks incubation   Follow Up Instructions:   3 months with Dr.  Elsworth Soho  I discussed the assessment and treatment plan with the patient. The patient was provided an opportunity to ask questions and all were answered. The patient agreed with the plan and demonstrated an understanding of the instructions.   The patient was advised to call back or seek an in-person evaluation if the symptoms worsen or if the condition fails to improve as anticipated.  I provided 15 minutes of non-face-to-face time during this encounter.   Martyn Ehrich, NP

## 2019-05-01 ENCOUNTER — Telehealth: Payer: Self-pay

## 2019-05-01 NOTE — Telephone Encounter (Signed)
COVID-19 Pre-Screening Questions:05/01/19  Do you currently have a fever (>100 F), chills or unexplained body aches? NO   Are you currently experiencing new cough, shortness of breath, sore throat, runny nose? NO .  Have you recently travelled outside the state of [place] in the last 14 days? NO  .  Have you been in contact with someone that is currently pending confirmation of Covid19 testing or has been confirmed to have the [place] virus?  NO   **If the patient answers NO to ALL questions -  advise the patient to please call the clinic before coming to the office should any symptoms develop.

## 2019-05-02 ENCOUNTER — Encounter: Payer: Self-pay | Admitting: Internal Medicine

## 2019-05-02 ENCOUNTER — Other Ambulatory Visit: Payer: Self-pay

## 2019-05-02 ENCOUNTER — Ambulatory Visit (INDEPENDENT_AMBULATORY_CARE_PROVIDER_SITE_OTHER): Payer: Medicare Other | Admitting: Internal Medicine

## 2019-05-02 VITALS — BP 151/70 | HR 87 | Temp 97.9°F

## 2019-05-02 DIAGNOSIS — A310 Pulmonary mycobacterial infection: Secondary | ICD-10-CM | POA: Diagnosis not present

## 2019-05-02 DIAGNOSIS — Z5181 Encounter for therapeutic drug level monitoring: Secondary | ICD-10-CM

## 2019-05-02 NOTE — Assessment & Plan Note (Signed)
Will check a CMP today

## 2019-05-02 NOTE — Assessment & Plan Note (Signed)
Doing well and some potential mild improvement but certainly no worsening of her respiratory symptoms.  She will continue with the same and plan to treat through April 2021.  If she worsens at all, will recheck the AFB sputum.

## 2019-05-02 NOTE — Progress Notes (Signed)
Subjective:    Patient ID: [name] Rice, female    DOB: 11/01/1940, 78 y.o.   MRN: [id number]  HPI Here for follow up of pulmonary MAC She is here continuing with a three times a week, three drug regimen with Azithromycin, ethambutol and rifampin.  Good tolerance of the medications and no new issues.  No associated rash or diarrhea.  No missed doses.  Last sputum was negative for MAI in May 2020.  Cough about the same. Pulmonary did allergy testing and added Advair with some positive effect.     Review of Systems  Constitutional: Negative for chills, fatigue and fever.  Gastrointestinal: Negative for diarrhea.  Skin: Negative for rash.       Objective:   Physical Exam Constitutional:      Appearance: Normal appearance.  Cardiovascular:     Rate and Rhythm: Normal rate and regular rhythm.     Heart sounds: No murmur.  Pulmonary:     Effort: Pulmonary effort is normal. No respiratory distress.     Breath sounds: Normal breath sounds.  Skin:    Findings: No rash.  Neurological:     Mental Status: She is alert.    SH: no tobacco       Assessment & Plan:

## 2019-05-03 LAB — COMPLETE METABOLIC PANEL WITH GFR
AG Ratio: 1.4 (calc) (ref 1.0–2.5)
ALT: 12 U/L (ref 6–29)
AST: 20 U/L (ref 10–35)
Albumin: 3.8 g/dL (ref 3.6–5.1)
Alkaline phosphatase (APISO): 70 U/L (ref 37–153)
BUN: 9 mg/dL (ref 7–25)
CO2: 28 mmol/L (ref 20–32)
Calcium: 9.1 mg/dL (ref 8.6–10.4)
Chloride: 105 mmol/L (ref 98–110)
Creat: 0.72 mg/dL (ref 0.60–0.88)
GFR, Est African American: 92 mL/min/{1.73_m2} (ref >=60)
GFR, Est Non African American: 79 mL/min/{1.73_m2} (ref >=60)
Globulin: 2.8 g/dL (calc) (ref 1.9–3.7)
Glucose, Bld: 89 mg/dL (ref 65–99)
Potassium: 4.7 mmol/L (ref 3.5–5.3)
Sodium: 140 mmol/L (ref 135–146)
Total Bilirubin: 0.4 mg/dL (ref 0.2–1.2)
Total Protein: 6.6 g/dL (ref 6.1–8.1)

## 2019-05-14 DIAGNOSIS — Z23 Encounter for immunization: Secondary | ICD-10-CM | POA: Diagnosis not present

## 2019-05-17 ENCOUNTER — Other Ambulatory Visit: Payer: Self-pay | Admitting: Primary Care

## 2019-05-21 MED FILL — rifAMPin 300 MG CAPS: 300 | 28 days supply | Qty: 24 | Fill #9

## 2019-05-21 MED FILL — MYAMBUTOL 400 MG TABLET: 400 | 28 days supply | Qty: 36 | Fill #9

## 2019-05-21 MED FILL — AZITHROMYCIN 500 MG TABS: 500 | 28 days supply | Qty: 12 | Fill #9

## 2019-06-17 MED FILL — MYAMBUTOL 400 MG TABLET: 400 | 28 days supply | Qty: 36 | Fill #10

## 2019-06-17 MED FILL — rifAMPin 300 MG CAPS: 300 | 28 days supply | Qty: 24 | Fill #10

## 2019-06-17 MED FILL — AZITHROMYCIN 500 MG TABS: 500 | 28 days supply | Qty: 12 | Fill #10

## 2019-07-12 ENCOUNTER — Telehealth: Payer: Self-pay | Admitting: Pharmacy Technician

## 2019-07-12 MED FILL — rifAMPin 300 MG CAPS: 300 | 28 days supply | Qty: 24 | Fill #11

## 2019-07-12 MED FILL — AZITHROMYCIN 500 MG TABS: 500 | 28 days supply | Qty: 12 | Fill #11

## 2019-07-12 MED FILL — ETHAMBUTOL HCL 400 MG TAB: 400 | 28 days supply | Qty: 36 | Fill #11

## 2019-07-12 NOTE — Telephone Encounter (Signed)
[facility] Patient Advocate Encounter  The patient is getting 3 medications filled at [facility], Zithromax, Myambutol, Rifampin. They will be shipped to her home on 10/30 to arrive around 11/02. Myambutol is back on short supply and the patient's refill this month will be a combination of unit-dose and lose tablets. The pharmacy had to switch over to Ethambutol due to back order shortages but the insurance company and PAF grant are still covering to make no cost for the patient.

## 2019-08-06 ENCOUNTER — Ambulatory Visit (INDEPENDENT_AMBULATORY_CARE_PROVIDER_SITE_OTHER): Payer: Medicare Other | Admitting: Internal Medicine

## 2019-08-06 ENCOUNTER — Other Ambulatory Visit: Payer: Self-pay

## 2019-08-06 ENCOUNTER — Encounter: Payer: Self-pay | Admitting: Internal Medicine

## 2019-08-06 VITALS — BP 162/82 | HR 76 | Temp 98.0°F | Ht 63.0 in | Wt 123.0 lb

## 2019-08-06 DIAGNOSIS — Z5181 Encounter for therapeutic drug level monitoring: Secondary | ICD-10-CM | POA: Diagnosis not present

## 2019-08-06 DIAGNOSIS — A310 Pulmonary mycobacterial infection: Secondary | ICD-10-CM | POA: Diagnosis not present

## 2019-08-06 LAB — COMPLETE METABOLIC PANEL WITH GFR
AG Ratio: 1.3 (calc) (ref 1.0–2.5)
ALT: 8 U/L (ref 6–29)
AST: 15 U/L (ref 10–35)
Albumin: 4 g/dL (ref 3.6–5.1)
Alkaline phosphatase (APISO): 66 U/L (ref 37–153)
BUN: 8 mg/dL (ref 7–25)
CO2: 27 mmol/L (ref 20–32)
Calcium: 9.1 mg/dL (ref 8.6–10.4)
Chloride: 102 mmol/L (ref 98–110)
Creat: 0.64 mg/dL (ref 0.60–0.88)
GFR, Est African American: 98 mL/min/{1.73_m2} (ref >=60)
GFR, Est Non African American: 84 mL/min/{1.73_m2} (ref >=60)
Globulin: 3 g/dL (calc) (ref 1.9–3.7)
Glucose, Bld: 79 mg/dL (ref 65–99)
Potassium: 4.9 mmol/L (ref 3.5–5.3)
Sodium: 137 mmol/L (ref 135–146)
Total Bilirubin: 0.4 mg/dL (ref 0.2–1.2)
Total Protein: 7 g/dL (ref 6.1–8.1)

## 2019-08-06 NOTE — Progress Notes (Signed)
Subjective:    Patient ID: [name] Rice, female    DOB: [birth year], 78 y.o.   MRN: [id number]  HPI Here for follow up of pulmonary MAI Continues on the three drug regimen 3 times a week and no new issues.  Breathing stable with just some mild cough but no worsening.  She associated rash or abdominal issues.      Review of Systems  Constitutional: Negative for chills and fever.  Gastrointestinal: Negative for diarrhea.  Skin: Negative for rash.       Objective:   Physical Exam Constitutional:      Appearance: Normal appearance.  Cardiovascular:     Rate and Rhythm: Normal rate and regular rhythm.     Heart sounds: No murmur.  Pulmonary:     Effort: Pulmonary effort is normal. No respiratory distress.     Breath sounds: Normal breath sounds.  Skin:    Findings: No rash.  Neurological:     Mental Status: She is alert.    SH: no tobacco       Assessment & Plan:

## 2019-08-06 NOTE — Assessment & Plan Note (Signed)
She seems to be stable and with clearance of the MAI She will continue with the three drug regimen through April and will plan to stop after that rtc 5 months

## 2019-08-06 NOTE — Assessment & Plan Note (Signed)
Will check her LFTs today

## 2019-08-09 ENCOUNTER — Other Ambulatory Visit: Payer: Self-pay | Admitting: Internal Medicine

## 2019-08-09 DIAGNOSIS — A310 Pulmonary mycobacterial infection: Secondary | ICD-10-CM

## 2019-08-12 MED FILL — AZITHROMYCIN 500 MG TABS: 500 | 28 days supply | Qty: 12 | Fill #0

## 2019-08-12 MED FILL — ETHAMBUTOL HCL 400 MG TAB: 400 | 28 days supply | Qty: 36 | Fill #0

## 2019-08-12 MED FILL — rifAMPin 300 MG CAPS: 300 | 28 days supply | Qty: 24 | Fill #0

## 2019-08-20 DIAGNOSIS — H401123 Primary open-angle glaucoma, left eye, severe stage: Secondary | ICD-10-CM | POA: Diagnosis not present

## 2019-08-31 ENCOUNTER — Other Ambulatory Visit: Payer: Self-pay | Admitting: Primary Care

## 2019-09-09 MED FILL — ETHAMBUTOL HCL 400 MG TAB: 400 | 28 days supply | Qty: 36 | Fill #1

## 2019-09-09 MED FILL — AZITHROMYCIN 500 MG TABS: 500 | 28 days supply | Qty: 12 | Fill #1

## 2019-09-09 MED FILL — rifAMPin 300 MG CAPS: 300 | 28 days supply | Qty: 24 | Fill #1

## 2019-09-09 NOTE — Telephone Encounter (Signed)
[facility] Patient Advocate Encounter   I was successful in securing patient a $2000 grant from [facility] to provide copayment coverage for Zithromax, Rifampin, Ethambutol. This will make the out of pocket cost $0.     I have spoken with the patient and the three medications will ship to her home 12/29.    The billing information is as follows and has been shared with [place].   RxBin: [id number] PCN: [id number] Member ID: [id number] Group ID: [id number] Dates of Eligibility: 09/09/2019 through 08/12/2020  Patient knows to call the office with questions or concerns.  Bartholomew Crews, [place] Patient [facility] Phone: [phone] Fax: [phone] 09/09/2019 11:21 AM

## 2019-09-16 ENCOUNTER — Telehealth: Payer: Self-pay

## 2019-09-16 NOTE — Telephone Encounter (Signed)
Yes, she is a great candidate because of MAC.  It starts this Sat for 73 and up, though not sure where yet, but she can just show up and get in line (probably at [facility] somewhere).

## 2019-09-16 NOTE — Telephone Encounter (Signed)
Patient called office to see if she would be a good candidate for COVID 19 vaccine. Patient is interested in getting vaccine, but is not sure if she would qualify due to MAC infection. PCP is unable to provide patient with information. [place]

## 2019-09-19 DIAGNOSIS — Z23 Encounter for immunization: Secondary | ICD-10-CM | POA: Diagnosis not present

## 2019-10-04 MED FILL — ETHAMBUTOL HCL 400 MG TAB: 400 | 28 days supply | Qty: 36 | Fill #2

## 2019-10-04 MED FILL — rifAMPin 300 MG CAPS: 300 | 28 days supply | Qty: 24 | Fill #2

## 2019-10-04 MED FILL — AZITHROMYCIN 500 MG TABS: 500 | 28 days supply | Qty: 12 | Fill #2

## 2019-10-10 DIAGNOSIS — Z23 Encounter for immunization: Secondary | ICD-10-CM | POA: Diagnosis not present

## 2019-10-25 DIAGNOSIS — Z1389 Encounter for screening for other disorder: Secondary | ICD-10-CM | POA: Diagnosis not present

## 2019-10-25 DIAGNOSIS — J479 Bronchiectasis, uncomplicated: Secondary | ICD-10-CM | POA: Diagnosis not present

## 2019-10-25 DIAGNOSIS — Z0000 Encounter for general adult medical examination without abnormal findings: Secondary | ICD-10-CM | POA: Diagnosis not present

## 2019-10-25 DIAGNOSIS — J4520 Mild intermittent asthma, uncomplicated: Secondary | ICD-10-CM | POA: Diagnosis not present

## 2019-10-25 DIAGNOSIS — M25552 Pain in left hip: Secondary | ICD-10-CM | POA: Diagnosis not present

## 2019-10-25 DIAGNOSIS — Z136 Encounter for screening for cardiovascular disorders: Secondary | ICD-10-CM | POA: Diagnosis not present

## 2019-11-02 MED FILL — AZITHROMYCIN 500 MG TABS: 500 | 28 days supply | Qty: 12 | Fill #3

## 2019-11-02 MED FILL — ETHAMBUTOL HCL 400 MG TAB: 400 | 28 days supply | Qty: 36 | Fill #3

## 2019-11-02 MED FILL — rifAMPin 300 MG CAPS: 300 | 28 days supply | Qty: 24 | Fill #3

## 2019-11-19 DIAGNOSIS — H2513 Age-related nuclear cataract, bilateral: Secondary | ICD-10-CM | POA: Diagnosis not present

## 2019-11-19 DIAGNOSIS — H401123 Primary open-angle glaucoma, left eye, severe stage: Secondary | ICD-10-CM | POA: Diagnosis not present

## 2019-11-19 DIAGNOSIS — H401112 Primary open-angle glaucoma, right eye, moderate stage: Secondary | ICD-10-CM | POA: Diagnosis not present

## 2019-11-26 DIAGNOSIS — J479 Bronchiectasis, uncomplicated: Secondary | ICD-10-CM | POA: Diagnosis not present

## 2019-11-26 DIAGNOSIS — H269 Unspecified cataract: Secondary | ICD-10-CM | POA: Diagnosis not present

## 2019-11-26 DIAGNOSIS — J449 Chronic obstructive pulmonary disease, unspecified: Secondary | ICD-10-CM | POA: Diagnosis not present

## 2019-11-26 DIAGNOSIS — Z01818 Encounter for other preprocedural examination: Secondary | ICD-10-CM | POA: Diagnosis not present

## 2019-11-26 DIAGNOSIS — H401134 Primary open-angle glaucoma, bilateral, indeterminate stage: Secondary | ICD-10-CM | POA: Diagnosis not present

## 2019-11-26 DIAGNOSIS — Z8619 Personal history of other infectious and parasitic diseases: Secondary | ICD-10-CM | POA: Diagnosis not present

## 2019-11-28 MED FILL — rifAMPin 300 MG CAPS: 300 | 28 days supply | Qty: 24 | Fill #4

## 2019-11-28 MED FILL — ETHAMBUTOL HCL 400 MG TAB: 400 | 28 days supply | Qty: 36 | Fill #4

## 2019-11-28 MED FILL — AZITHROMYCIN 500 MG TABS: 500 | 28 days supply | Qty: 12 | Fill #4

## 2019-12-02 DIAGNOSIS — H2513 Age-related nuclear cataract, bilateral: Secondary | ICD-10-CM | POA: Diagnosis not present

## 2019-12-02 DIAGNOSIS — H401123 Primary open-angle glaucoma, left eye, severe stage: Secondary | ICD-10-CM | POA: Diagnosis not present

## 2019-12-02 DIAGNOSIS — Z20822 Contact with and (suspected) exposure to covid-19: Secondary | ICD-10-CM | POA: Diagnosis not present

## 2019-12-04 DIAGNOSIS — H401123 Primary open-angle glaucoma, left eye, severe stage: Secondary | ICD-10-CM | POA: Diagnosis not present

## 2019-12-04 DIAGNOSIS — J479 Bronchiectasis, uncomplicated: Secondary | ICD-10-CM | POA: Diagnosis not present

## 2019-12-04 DIAGNOSIS — Z87891 Personal history of nicotine dependence: Secondary | ICD-10-CM | POA: Diagnosis not present

## 2019-12-04 DIAGNOSIS — Z79899 Other long term (current) drug therapy: Secondary | ICD-10-CM | POA: Diagnosis not present

## 2019-12-04 DIAGNOSIS — H2512 Age-related nuclear cataract, left eye: Secondary | ICD-10-CM | POA: Diagnosis not present

## 2019-12-25 ENCOUNTER — Other Ambulatory Visit: Payer: Self-pay

## 2019-12-25 ENCOUNTER — Other Ambulatory Visit: Payer: Self-pay | Admitting: Internal Medicine

## 2019-12-30 MED FILL — ETHAMBUTOL HCL 400 MG TAB: 400 | 28 days supply | Qty: 36 | Fill #5

## 2019-12-30 MED FILL — AZITHROMYCIN 500 MG TABS: 500 | 28 days supply | Qty: 12 | Fill #5

## 2019-12-30 MED FILL — rifAMPin 300 MG CAPS: 300 | 28 days supply | Qty: 24 | Fill #5

## 2020-01-07 ENCOUNTER — Telehealth: Payer: Self-pay

## 2020-01-07 NOTE — Telephone Encounter (Signed)
COVID-19 Pre-Screening Questions:01/07/20  Do you currently have a fever (>100 F), chills or unexplained body aches? NO   Are you currently experiencing new cough, shortness of breath, sore throat, runny nose? NO .  Have you recently travelled outside the state of [place] in the last 14 days? NO .  Have you been in contact with someone that is currently pending confirmation of Covid19 testing or has been confirmed to have the [place] virus?  NO  **If the patient answers NO to ALL questions -  advise the patient to please call the clinic before coming to the office should any symptoms develop.

## 2020-01-08 ENCOUNTER — Other Ambulatory Visit: Payer: Self-pay

## 2020-01-08 ENCOUNTER — Encounter: Payer: Self-pay | Admitting: Internal Medicine

## 2020-01-08 ENCOUNTER — Ambulatory Visit (INDEPENDENT_AMBULATORY_CARE_PROVIDER_SITE_OTHER): Payer: Medicare Other | Admitting: Internal Medicine

## 2020-01-08 VITALS — BP 159/77 | HR 88 | Temp 97.6°F | Ht 63.0 in | Wt 119.0 lb

## 2020-01-08 DIAGNOSIS — Z5181 Encounter for therapeutic drug level monitoring: Secondary | ICD-10-CM

## 2020-01-08 DIAGNOSIS — A310 Pulmonary mycobacterial infection: Secondary | ICD-10-CM

## 2020-01-08 DIAGNOSIS — J479 Bronchiectasis, uncomplicated: Secondary | ICD-10-CM | POA: Diagnosis not present

## 2020-01-08 LAB — HEPATIC FUNCTION PANEL
AG Ratio: 1.2 (calc) (ref 1.0–2.5)
ALT: 8 U/L (ref 6–29)
AST: 13 U/L (ref 10–35)
Albumin: 3.8 g/dL (ref 3.6–5.1)
Alkaline phosphatase (APISO): 63 U/L (ref 37–153)
Bilirubin, Direct: 0.1 mg/dL (ref 0.0–0.2)
Globulin: 3.3 g/dL (calc) (ref 1.9–3.7)
Indirect Bilirubin: 0.3 mg/dL (calc) (ref 0.2–1.2)
Total Bilirubin: 0.4 mg/dL (ref 0.2–1.2)
Total Protein: 7.1 g/dL (ref 6.1–8.1)

## 2020-01-08 MED ORDER — ETHAMBUTOL HCL 400 MG PO TABS: 1200.0000 mg | ORAL_TABLET | ORAL | 5 refills | Status: DC

## 2020-01-08 MED ORDER — AZITHROMYCIN 500 MG PO TABS: 500.0000 mg | ORAL_TABLET | ORAL | 5 refills | Status: DC

## 2020-01-08 MED ORDER — RIFAMPIN 300 MG PO CAPS: 600.0000 mg | ORAL_CAPSULE | ORAL | 5 refills | Status: DC

## 2020-01-08 NOTE — Assessment & Plan Note (Addendum)
She has completed 1 year of post, sputum negative antibiotics with 3 drug therapy.  Some worsening in her symptomatology but unclear if her lung function is more stable.  I have recommended to return to pulmonary to consider PFTs for comparison, possible reCT.   I will refill the antibiotics in the meantime.  Will consider continuing if there has been stabilization or improvement.

## 2020-01-08 NOTE — Progress Notes (Signed)
   Subjective:    Patient ID: [name] Rice, female    DOB: [birth year], 79 y.o.   MRN: [id number]  HPI Here for follow up of pulmonary MAI Continues on the three drug regimen 3 times a week and no new issues.  Breathing has not improved, better in the am but overall she feels worse.  Tolerating the antibiotics well and has been on them about 1 year since her last negative sputum.   Some weight loss.      Review of Systems  Constitutional: Negative for chills and fever.  Gastrointestinal: Negative for diarrhea.  Skin: Negative for rash.       Objective:   Physical Exam Constitutional:      Appearance: Normal appearance.  Cardiovascular:     Rate and Rhythm: Normal rate and regular rhythm.     Heart sounds: No murmur.  Pulmonary:     Effort: Pulmonary effort is normal. No respiratory distress.     Breath sounds: Normal breath sounds.  Skin:    Findings: No rash.  Neurological:     Mental Status: She is alert.  Psychiatric:        Mood and Affect: Mood normal.    SH: no tobacco       Assessment & Plan:

## 2020-01-08 NOTE — Assessment & Plan Note (Signed)
Will check a hepatic panel

## 2020-01-08 NOTE — Assessment & Plan Note (Signed)
Ongoing issue and continues to use her inhalers.

## 2020-01-14 ENCOUNTER — Telehealth: Payer: Self-pay | Admitting: Pulmonary Disease

## 2020-01-14 DIAGNOSIS — A310 Pulmonary mycobacterial infection: Secondary | ICD-10-CM

## 2020-01-14 DIAGNOSIS — J479 Bronchiectasis, uncomplicated: Secondary | ICD-10-CM

## 2020-01-14 NOTE — Telephone Encounter (Signed)
Left message for patient to call back.

## 2020-01-14 NOTE — Telephone Encounter (Signed)
I was glad to hear that she has tolerated MAC treatment well and completed 1 year Discussed with Dr. Linus Salmons from ID Proceed with HRCT and follow-up appointment with me after

## 2020-01-15 NOTE — Telephone Encounter (Signed)
Left message for patient to call back.

## 2020-01-15 NOTE — Telephone Encounter (Signed)
Called pt and advised message from the provider. Pt understood and verbalized understanding. Nothing further is needed.   Order placed for HRCT

## 2020-01-17 NOTE — Telephone Encounter (Signed)
LMTCB X 3 and will close per protocol

## 2020-01-23 ENCOUNTER — Ambulatory Visit (HOSPITAL_COMMUNITY)
Admission: RE | Admit: 2020-01-23 | Discharge: 2020-01-23 | Disposition: A | Discharge status: Home / Self Care | Payer: Medicare Other | Source: Ambulatory Visit | Attending: Pulmonary Disease | Admitting: Pulmonary Disease

## 2020-01-23 ENCOUNTER — Other Ambulatory Visit: Payer: Self-pay

## 2020-01-23 DIAGNOSIS — J181 Lobar pneumonia, unspecified organism: Secondary | ICD-10-CM | POA: Diagnosis not present

## 2020-01-23 DIAGNOSIS — A310 Pulmonary mycobacterial infection: Secondary | ICD-10-CM | POA: Insufficient documentation

## 2020-01-23 DIAGNOSIS — J479 Bronchiectasis, uncomplicated: Secondary | ICD-10-CM | POA: Diagnosis not present

## 2020-01-24 NOTE — Progress Notes (Signed)
Patient identification verified. Results of recent CT scan of chest reviewed.  Per Dr. Elsworth Soho, bronchiectasis has not much change compared to 2019 - which is good. Follow up as planned.  Patient verbalized understanding of results.

## 2020-01-27 MED FILL — rifAMPin 300 MG CAPS: 300 | 28 days supply | Qty: 24 | Fill #0

## 2020-01-27 MED FILL — AZITHROMYCIN 500 MG TABLET: 500 | 28 days supply | Qty: 12 | Fill #0

## 2020-01-27 MED FILL — ETHAMBUTOL HCL 400 MG TAB: 400 | 28 days supply | Qty: 36 | Fill #0

## 2020-02-11 ENCOUNTER — Other Ambulatory Visit: Payer: Self-pay | Admitting: Adult Health

## 2020-02-11 DIAGNOSIS — Z1231 Encounter for screening mammogram for malignant neoplasm of breast: Secondary | ICD-10-CM

## 2020-03-04 ENCOUNTER — Other Ambulatory Visit: Payer: Self-pay | Admitting: Internal Medicine

## 2020-03-09 ENCOUNTER — Other Ambulatory Visit: Payer: Self-pay | Admitting: Pulmonary Disease

## 2020-03-09 ENCOUNTER — Ambulatory Visit (INDEPENDENT_AMBULATORY_CARE_PROVIDER_SITE_OTHER): Payer: Medicare Other | Admitting: Pulmonary Disease

## 2020-03-09 ENCOUNTER — Encounter: Payer: Self-pay | Admitting: Pulmonary Disease

## 2020-03-09 ENCOUNTER — Other Ambulatory Visit: Payer: Self-pay

## 2020-03-09 DIAGNOSIS — A310 Pulmonary mycobacterial infection: Secondary | ICD-10-CM | POA: Diagnosis not present

## 2020-03-09 DIAGNOSIS — J479 Bronchiectasis, uncomplicated: Secondary | ICD-10-CM

## 2020-03-09 MED ORDER — BENZONATATE 200 MG PO CAPS
200.0000 mg | ORAL_CAPSULE | Freq: Three times a day (TID) | ORAL | 1 refills | Status: DC | PRN
Start: 2020-03-09 — End: 2020-08-19

## 2020-03-09 MED ORDER — SODIUM CHLORIDE 3 % IN NEBU
INHALATION_SOLUTION | RESPIRATORY_TRACT | 5 refills | Status: DC
Start: 2020-03-09 — End: 2020-03-10

## 2020-03-09 NOTE — Progress Notes (Signed)
   Subjective:    Patient ID: [name] Rice, female    DOB: 1941-03-13, 79 y.o.   MRN: [id number]  HPI  37 yoremote smokerFor FU of bronchiectasisdue to MAC She initially presented with hemoptysis  Sputum cx POS 08/2017 & 09/2017 Started on three times weekly rifampim, ethambutol and azithromycin around 12/2018 tolerating triple therapy Sputum 01/2019 was negative  She has lost some weight. Main symptom is cough for which she takes albuterol up to 3 times daily. Breathing is otherwise stable  [place].  Has not travel to [place].  Received Covid vaccine  Significant tests/ events reviewed HRCT 01/2020 extensive bronchiectasis, mucoid impaction, tree-in-bud opacities and subpleural postinfectious scarring. No appreciable change   Allergy panel positive for various allergens including dogs, birch, oak, ragweed.  PFTs 12/2017 ratio 68, FEV1 87%, FVC 92%, TLC nml, DLCO 68%  PFTs2016 FEV1 FVC 66 with FEV1 2.09 (102% of predicted), normal MVV normal lung volumes and normal DLCO 2016: Normal, unchanged except mild obstruction with FEV1 1.71/87%   2011. CT reveals bilateral tree-in-bud opacities more prominent in upper lobe and middle lobe Ct chest 05/14 - bil lower lobe opacities 08/2017 CT angio neg PE,Interval increase of diffuse nodular opacities with areas of bronchiectasis within the lung apices, the right middle lobe and the lingula.S/o MAI  07/2018 worsening bronchiectasis    Review of Systems Patient denies significant dyspnea,cough, hemoptysis,  chest pain, palpitations, pedal edema, orthopnea, paroxysmal nocturnal dyspnea, lightheadedness, nausea, vomiting, abdominal or  leg pains      Objective:   Physical Exam   Gen. Pleasant, thin, in no distress ENT - no thrush, no pallor/icterus,no post nasal drip Neck: No JVD, no thyromegaly, no carotid bruits Lungs: no use of accessory muscles, no dullness to percussion, RLL   rales no rhonchi  Cardiovascular: Rhythm regular, heart sounds  normal, no murmurs or gallops, no peripheral edema Musculoskeletal: No deformities, no cyanosis or clubbing         Assessment & Plan:

## 2020-03-09 NOTE — Assessment & Plan Note (Signed)
Schedule PFTs-spirometry and DLCO only.  HRCT is stable, if lung function stable will discuss with Dr. Linus Salmons about stopping triple therapy

## 2020-03-09 NOTE — Addendum Note (Signed)
Addended by: Edythe Clarity on: 03/09/2020 09:44 AM   Modules accepted: Orders

## 2020-03-09 NOTE — Patient Instructions (Signed)
  Schedule PFTs-spirometry and DLCO only.  For cough, prescription for nebulizer Hypertonic saline nebs once daily for 1 month and  5 refills Benzonatate 200 mg twice daily as needed for cough  We will discuss with Dr. Linus Salmons about stopping triple therapy

## 2020-03-09 NOTE — Assessment & Plan Note (Signed)
Airway clearance again seems to be the most important issue here. Her main symptom is chronic cough and viscous sputum  For cough, prescription for nebulizer Hypertonic saline nebs once daily for 1 month and  5 refills Benzonatate 200 mg twice daily as needed for cough

## 2020-03-10 NOTE — Telephone Encounter (Signed)
Pt sodium chloride vial needs dx code for insurance to pay for refills

## 2020-03-10 NOTE — Telephone Encounter (Signed)
Please use bronchiectasis

## 2020-03-12 ENCOUNTER — Telehealth: Payer: Self-pay | Admitting: Pulmonary Disease

## 2020-03-12 MED ORDER — SODIUM CHLORIDE 3 % IN NEBU: INHALATION_SOLUTION | Freq: Every day | RESPIRATORY_TRACT | 5 refills | Status: DC

## 2020-03-12 NOTE — Telephone Encounter (Signed)
Patient contacted, refill of sodium chloride sent to preferred pharmacy.

## 2020-03-16 NOTE — Progress Notes (Signed)
Yes, I think I will consider stopping the triple therapy when she sees me in August.

## 2020-03-19 MED FILL — rifAMPin 300 MG CAPS: 300 | 28 days supply | Qty: 24 | Fill #2

## 2020-03-19 MED FILL — ETHAMBUTOL HCL 400 MG TAB: 400 | 28 days supply | Qty: 36 | Fill #2

## 2020-03-19 MED FILL — AZITHROMYCIN 500 MG TABS: 500 | 28 days supply | Qty: 12 | Fill #2

## 2020-03-31 ENCOUNTER — Telehealth: Payer: Self-pay | Admitting: Pulmonary Disease

## 2020-03-31 MED ORDER — SODIUM CHLORIDE 3 % IN NEBU: INHALATION_SOLUTION | Freq: Every day | RESPIRATORY_TRACT | 5 refills | Status: DC

## 2020-03-31 NOTE — Telephone Encounter (Signed)
Refill of sodium chloride nebulizer solution sent to preferred pharmacy.

## 2020-03-31 NOTE — Telephone Encounter (Signed)
Great! Please make sure that she has refills x5

## 2020-03-31 NOTE — Telephone Encounter (Signed)
Patient calling to update Dr. Elsworth Soho that her hypertonic nebulizer solution is helping. She continues to have low energy, white to green sputum. She is reporting having a golf ball size bit of sputum on Saturday that was reddish brown. This only occurred once and she has not had any other brown/red secretions. She reports her cough is better. She remains on Zithromax, Riampin, and Ethambutol. Last OV 03/09/2020 She is not requesting anything at this time, just wanted to update you.

## 2020-04-03 ENCOUNTER — Other Ambulatory Visit: Payer: Self-pay

## 2020-04-03 ENCOUNTER — Ambulatory Visit (INDEPENDENT_AMBULATORY_CARE_PROVIDER_SITE_OTHER): Payer: Medicare Other | Admitting: Pulmonary Disease

## 2020-04-03 DIAGNOSIS — J479 Bronchiectasis, uncomplicated: Secondary | ICD-10-CM

## 2020-04-03 DIAGNOSIS — A310 Pulmonary mycobacterial infection: Secondary | ICD-10-CM | POA: Diagnosis not present

## 2020-04-03 LAB — PULMONARY FUNCTION TEST
DL/VA % pred: 121 %
DL/VA: 4.99 ml/min/mmHg/L
DLCO cor % pred: 79 %
DLCO cor: 14.39 ml/min/mmHg
DLCO unc % pred: 79 %
DLCO unc: 14.39 ml/min/mmHg
FEF 25-75 Pre: 1 L/sec
FEF2575-%Pred-Pre: 77 %
FEV1-%Pred-Pre: 87 %
FEV1-Pre: 1.58 L
FEV1FVC-%Pred-Pre: 96 %
FEV6-%Pred-Pre: 97 %
FEV6-Pre: 2.24 L
FEV6FVC-%Pred-Pre: 106 %
FVC-%Pred-Pre: 91 %
FVC-Pre: 2.24 L
Pre FEV1/FVC ratio: 71 %
Pre FEV6/FVC Ratio: 100 %

## 2020-04-03 NOTE — Progress Notes (Signed)
Spirometry and Dlco done today. 

## 2020-04-13 ENCOUNTER — Ambulatory Visit
Admission: RE | Admit: 2020-04-13 | Discharge: 2020-04-13 | Disposition: A | Discharge status: Home / Self Care | Payer: Medicare Other | Source: Ambulatory Visit | Attending: Adult Health | Admitting: Adult Health

## 2020-04-13 ENCOUNTER — Other Ambulatory Visit: Payer: Self-pay

## 2020-04-13 DIAGNOSIS — Z1231 Encounter for screening mammogram for malignant neoplasm of breast: Secondary | ICD-10-CM

## 2020-04-15 ENCOUNTER — Encounter: Payer: Self-pay | Admitting: Internal Medicine

## 2020-04-15 ENCOUNTER — Other Ambulatory Visit: Payer: Self-pay

## 2020-04-15 ENCOUNTER — Ambulatory Visit (INDEPENDENT_AMBULATORY_CARE_PROVIDER_SITE_OTHER): Payer: Medicare Other | Admitting: Internal Medicine

## 2020-04-15 DIAGNOSIS — J479 Bronchiectasis, uncomplicated: Secondary | ICD-10-CM | POA: Diagnosis not present

## 2020-04-15 DIAGNOSIS — A310 Pulmonary mycobacterial infection: Secondary | ICD-10-CM | POA: Diagnosis not present

## 2020-04-15 NOTE — Assessment & Plan Note (Signed)
She has been on effective treatment for 16 months post negative cultures and she continues to lose weight.  I feel at this point that she has received maximum benefit from antibiotic treatment for this and feel it is reasonable to stop her triple therapy.   She will call with worsening chronic symptoms and will continue to follow up with Dr. Elsworth Soho.   rtc PRN

## 2020-04-15 NOTE — Assessment & Plan Note (Signed)
PFTs have been stable which is a good sign.  She will continue with her pulmonary therapy.

## 2020-04-15 NOTE — Progress Notes (Signed)
Subjective:    Patient ID: [name] Rice, female    DOB: 31-May-1941, 79 y.o.   MRN: [id number]  HPI Here for follow up of pulmonary MAI Continues on the three drug regimen 3 times a week with rifampin, ethambutol and azithromycin.  She continues to lose weight with poor appetite on the medications.  She was seen recently by Dr. Elsworth Soho and PFTs are stable.  Her negative sputum is from April 2020 and now 16 months from the negative sputum.      Review of Systems  Constitutional: Negative for fever.  Gastrointestinal: Negative for diarrhea.  Skin: Negative for rash.       Objective:   Physical Exam Constitutional:      Appearance: Normal appearance.  Pulmonary:     Effort: Pulmonary effort is normal.  Skin:    Findings: No rash.  Neurological:     Mental Status: She is alert.  Psychiatric:        Mood and Affect: Mood normal.    SH: no tobacco       Assessment & Plan:

## 2020-05-01 DIAGNOSIS — Z23 Encounter for immunization: Secondary | ICD-10-CM | POA: Diagnosis not present

## 2020-05-04 ENCOUNTER — Other Ambulatory Visit: Payer: Self-pay | Admitting: Internal Medicine

## 2020-05-11 DIAGNOSIS — R634 Abnormal weight loss: Secondary | ICD-10-CM | POA: Diagnosis not present

## 2020-05-11 DIAGNOSIS — R000 Tachycardia, unspecified: Secondary | ICD-10-CM | POA: Diagnosis not present

## 2020-05-11 DIAGNOSIS — R0600 Dyspnea, unspecified: Secondary | ICD-10-CM | POA: Diagnosis not present

## 2020-05-11 DIAGNOSIS — E440 Moderate protein-calorie malnutrition: Secondary | ICD-10-CM | POA: Diagnosis not present

## 2020-05-12 DIAGNOSIS — H2511 Age-related nuclear cataract, right eye: Secondary | ICD-10-CM | POA: Diagnosis not present

## 2020-05-12 DIAGNOSIS — H401123 Primary open-angle glaucoma, left eye, severe stage: Secondary | ICD-10-CM | POA: Diagnosis not present

## 2020-05-12 DIAGNOSIS — H401112 Primary open-angle glaucoma, right eye, moderate stage: Secondary | ICD-10-CM | POA: Diagnosis not present

## 2020-05-12 DIAGNOSIS — Z961 Presence of intraocular lens: Secondary | ICD-10-CM | POA: Diagnosis not present

## 2020-05-13 ENCOUNTER — Other Ambulatory Visit (HOSPITAL_COMMUNITY): Payer: Self-pay | Admitting: Internal Medicine

## 2020-05-13 DIAGNOSIS — R0609 Other forms of dyspnea: Secondary | ICD-10-CM

## 2020-05-29 ENCOUNTER — Other Ambulatory Visit: Payer: Self-pay

## 2020-05-29 ENCOUNTER — Ambulatory Visit (HOSPITAL_COMMUNITY): Payer: Medicare Other | Attending: Cardiology

## 2020-05-29 DIAGNOSIS — R0600 Dyspnea, unspecified: Secondary | ICD-10-CM

## 2020-05-29 DIAGNOSIS — R0609 Other forms of dyspnea: Secondary | ICD-10-CM

## 2020-05-29 LAB — ECHOCARDIOGRAM COMPLETE
Area-P 1/2: 3.34 cm2
S' Lateral: 2.9 cm

## 2020-06-10 DIAGNOSIS — Z23 Encounter for immunization: Secondary | ICD-10-CM | POA: Diagnosis not present

## 2020-06-27 ENCOUNTER — Other Ambulatory Visit: Payer: Self-pay

## 2020-06-27 ENCOUNTER — Encounter (HOSPITAL_COMMUNITY): Payer: Self-pay

## 2020-06-27 ENCOUNTER — Inpatient Hospital Stay (HOSPITAL_COMMUNITY)
Admission: EM | Admit: 2020-06-27 | Discharge: 2020-06-29 | DRG: 194 | Disposition: A | Discharge status: Home / Self Care | Payer: Medicare Other | Attending: Family Medicine | Admitting: Family Medicine

## 2020-06-27 ENCOUNTER — Emergency Department (HOSPITAL_COMMUNITY): Payer: Medicare Other

## 2020-06-27 ENCOUNTER — Inpatient Hospital Stay (HOSPITAL_COMMUNITY): Payer: Medicare Other

## 2020-06-27 DIAGNOSIS — Z66 Do not resuscitate: Secondary | ICD-10-CM | POA: Diagnosis present

## 2020-06-27 DIAGNOSIS — H409 Unspecified glaucoma: Secondary | ICD-10-CM | POA: Diagnosis present

## 2020-06-27 DIAGNOSIS — Z79899 Other long term (current) drug therapy: Secondary | ICD-10-CM

## 2020-06-27 DIAGNOSIS — R7989 Other specified abnormal findings of blood chemistry: Secondary | ICD-10-CM | POA: Diagnosis present

## 2020-06-27 DIAGNOSIS — Z87891 Personal history of nicotine dependence: Secondary | ICD-10-CM

## 2020-06-27 DIAGNOSIS — Z9010 Acquired absence of unspecified breast and nipple: Secondary | ICD-10-CM | POA: Diagnosis not present

## 2020-06-27 DIAGNOSIS — Z20822 Contact with and (suspected) exposure to covid-19: Secondary | ICD-10-CM | POA: Diagnosis present

## 2020-06-27 DIAGNOSIS — J189 Pneumonia, unspecified organism: Principal | ICD-10-CM

## 2020-06-27 DIAGNOSIS — K219 Gastro-esophageal reflux disease without esophagitis: Secondary | ICD-10-CM | POA: Diagnosis present

## 2020-06-27 DIAGNOSIS — Z853 Personal history of malignant neoplasm of breast: Secondary | ICD-10-CM

## 2020-06-27 DIAGNOSIS — Z800 Family history of malignant neoplasm of digestive organs: Secondary | ICD-10-CM | POA: Diagnosis not present

## 2020-06-27 DIAGNOSIS — Z91041 Radiographic dye allergy status: Secondary | ICD-10-CM | POA: Diagnosis not present

## 2020-06-27 DIAGNOSIS — R059 Cough, unspecified: Secondary | ICD-10-CM | POA: Diagnosis not present

## 2020-06-27 DIAGNOSIS — R634 Abnormal weight loss: Secondary | ICD-10-CM | POA: Diagnosis present

## 2020-06-27 DIAGNOSIS — Z681 Body mass index (BMI) 19 or less, adult: Secondary | ICD-10-CM

## 2020-06-27 DIAGNOSIS — J9811 Atelectasis: Secondary | ICD-10-CM | POA: Diagnosis not present

## 2020-06-27 DIAGNOSIS — R0781 Pleurodynia: Secondary | ICD-10-CM | POA: Diagnosis not present

## 2020-06-27 DIAGNOSIS — J984 Other disorders of lung: Secondary | ICD-10-CM | POA: Diagnosis not present

## 2020-06-27 DIAGNOSIS — J90 Pleural effusion, not elsewhere classified: Secondary | ICD-10-CM | POA: Diagnosis not present

## 2020-06-27 HISTORY — DX: Bronchiectasis, uncomplicated: J47.9

## 2020-06-27 LAB — COMPREHENSIVE METABOLIC PANEL
ALT: 17 U/L (ref 0–44)
AST: 29 U/L (ref 15–41)
Albumin: 2.9 g/dL — ABNORMAL LOW (ref 3.5–5.0)
Alkaline Phosphatase: 128 U/L — ABNORMAL HIGH (ref 38–126)
Anion gap: 12 (ref 5–15)
BUN: 11 mg/dL (ref 8–23)
CO2: 24 mmol/L (ref 22–32)
Calcium: 8.7 mg/dL — ABNORMAL LOW (ref 8.9–10.3)
Chloride: 97 mmol/L — ABNORMAL LOW (ref 98–111)
Creatinine, Ser: 0.57 mg/dL (ref 0.44–1.00)
GFR, Estimated: >60 mL/min (ref >60)
Glucose, Bld: 112 mg/dL — ABNORMAL HIGH (ref 70–99)
Potassium: 3.7 mmol/L (ref 3.5–5.1)
Sodium: 133 mmol/L — ABNORMAL LOW (ref 135–145)
Total Bilirubin: 1.2 mg/dL (ref 0.3–1.2)
Total Protein: 7.5 g/dL (ref 6.5–8.1)

## 2020-06-27 LAB — CBC WITH DIFFERENTIAL/PLATELET
Abs Immature Granulocytes: 0.07 10*3/uL (ref 0.00–0.07)
Basophils Absolute: 0 10*3/uL (ref 0.0–0.1)
Basophils Relative: 0 %
Eosinophils Absolute: 0.1 10*3/uL (ref 0.0–0.5)
Eosinophils Relative: 1 %
HCT: 34.1 % — ABNORMAL LOW (ref 36.0–46.0)
Hemoglobin: 10.9 g/dL — ABNORMAL LOW (ref 12.0–15.0)
Immature Granulocytes: 0 %
Lymphocytes Relative: 10 %
Lymphs Abs: 1.7 10*3/uL (ref 0.7–4.0)
MCH: 29.2 pg (ref 26.0–34.0)
MCHC: 32 g/dL (ref 30.0–36.0)
MCV: 91.4 fL (ref 80.0–100.0)
Monocytes Absolute: 1 10*3/uL (ref 0.1–1.0)
Monocytes Relative: 6 %
Neutro Abs: 13.6 10*3/uL — ABNORMAL HIGH (ref 1.7–7.7)
Neutrophils Relative %: 83 %
Platelets: 365 10*3/uL (ref 150–400)
RBC: 3.73 MIL/uL — ABNORMAL LOW (ref 3.87–5.11)
RDW: 13.4 % (ref 11.5–15.5)
WBC: 16.5 10*3/uL — ABNORMAL HIGH (ref 4.0–10.5)
nRBC: 0 % (ref 0.0–0.2)

## 2020-06-27 LAB — D-DIMER, QUANTITATIVE: D-Dimer, Quant: 4.34 ug/mL-FEU — ABNORMAL HIGH (ref 0.00–0.50)

## 2020-06-27 LAB — RESPIRATORY PANEL BY RT PCR (FLU A&B, COVID)
Influenza A by PCR: NEGATIVE
Influenza B by PCR: NEGATIVE
SARS Coronavirus 2 by RT PCR: NEGATIVE

## 2020-06-27 LAB — LACTIC ACID, PLASMA: Lactic Acid, Venous: 1 mmol/L (ref 0.5–1.9)

## 2020-06-27 MED ORDER — SODIUM CHLORIDE 0.9 % IV SOLN: 2.0000 g | INTRAVENOUS | Status: DC

## 2020-06-27 MED ORDER — TECHNETIUM TO 99M ALBUMIN AGGREGATED
4.4000 | Freq: Once | INTRAVENOUS | Status: AC | PRN
  Administered 2020-06-27: 4.4 via INTRAVENOUS

## 2020-06-27 MED ORDER — IBUPROFEN 200 MG PO TABS: 600.0000 mg | ORAL_TABLET | Freq: Once | ORAL | Status: DC

## 2020-06-27 MED ORDER — FLUTTER DEVI
1.0000 | Freq: Three times a day (TID) | Status: DC
  Filled 2020-06-27 (×4): qty 1

## 2020-06-27 MED ORDER — SODIUM CHLORIDE 0.9 % IV SOLN
500.0000 mg | Freq: Once | INTRAVENOUS | Status: AC
  Administered 2020-06-27: 500 mg via INTRAVENOUS
  Filled 2020-06-27: qty 500

## 2020-06-27 MED ORDER — SODIUM CHLORIDE 0.9 % IV SOLN: 500.0000 mg | INTRAVENOUS | Status: DC

## 2020-06-27 MED ORDER — SODIUM CHLORIDE 0.9 % IV SOLN
2.0000 g | Freq: Once | INTRAVENOUS | Status: AC
  Administered 2020-06-27: 2 g via INTRAVENOUS
  Filled 2020-06-27: qty 20

## 2020-06-27 MED ORDER — SODIUM CHLORIDE 0.9% FLUSH: 3.0000 mL | INTRAVENOUS | Status: DC | PRN

## 2020-06-27 MED ORDER — LATANOPROST 0.005 % OP SOLN
1.0000 [drp] | Freq: Every day | OPHTHALMIC | Status: DC
  Administered 2020-06-27 – 2020-06-28 (×2): 1 [drp] via OPHTHALMIC
  Filled 2020-06-27: qty 2.5

## 2020-06-27 MED ORDER — SODIUM CHLORIDE 0.9 % IV SOLN: 250.0000 mL | INTRAVENOUS | Status: DC | PRN

## 2020-06-27 MED ORDER — ENOXAPARIN SODIUM 40 MG/0.4ML ~~LOC~~ SOLN
40.0000 mg | SUBCUTANEOUS | Status: DC
  Administered 2020-06-27 – 2020-06-28 (×2): 40 mg via SUBCUTANEOUS
  Filled 2020-06-27 (×2): qty 0.4

## 2020-06-27 MED ORDER — ACETAMINOPHEN 500 MG PO TABS
1000.0000 mg | ORAL_TABLET | Freq: Once | ORAL | Status: AC
  Administered 2020-06-27: 1000 mg via ORAL
  Filled 2020-06-27: qty 2

## 2020-06-27 MED ORDER — SODIUM CHLORIDE 0.9 % IV SOLN: INTRAVENOUS | Status: AC

## 2020-06-27 MED ORDER — GUAIFENESIN ER 600 MG PO TB12
600.0000 mg | ORAL_TABLET | Freq: Two times a day (BID) | ORAL | Status: DC
  Administered 2020-06-27 – 2020-06-29 (×4): 600 mg via ORAL
  Filled 2020-06-27 (×4): qty 1

## 2020-06-27 MED ORDER — SODIUM CHLORIDE 0.9% FLUSH
3.0000 mL | Freq: Two times a day (BID) | INTRAVENOUS | Status: DC
  Administered 2020-06-27 – 2020-06-29 (×4): 3 mL via INTRAVENOUS

## 2020-06-27 MED ORDER — IPRATROPIUM-ALBUTEROL 0.5-2.5 (3) MG/3ML IN SOLN: 3.0000 mL | Freq: Four times a day (QID) | RESPIRATORY_TRACT | Status: DC | PRN

## 2020-06-27 NOTE — H&P (Signed)
TRH H&P    Patient Demographics:    [name] Rice, is a 79 y.o. female  MRN: [id number]  DOB - 04/26/1941  Admit Date - 06/27/2020  Referring MD/NP/PA: Gladys Damme  Outpatient Primary MD for the patient is Lavone Orn, MD  Patient coming from: Home  Chief complaint-shortness of breath   HPI:    [name] Rice  is a 79 y.o. female, with history of MAC completed 87-monthtreatment in May this year butalbital, rifampin, Zithromax, bronchiectasis, breast cancer s/p mastectomy, in remission, glaucoma, GERD came to hospital with chief complaint of shortness of breath, coughing spells. Patient says that she was having left-sided chest pain when coughing. Denies coughing up any blood. In the ED she was found to have temperature of 100 F. She denies nausea vomiting or diarrhea. Denies abdominal pain or dysuria Chest x-ray revealed left lower lobe infiltrate, patient started on ceftriaxone and Zithromax. D-dimer was elevated at 4.34. Patient is currently not requiring oxygen in the ED.    Review of systems:    In addition to the HPI above,   All other systems reviewed and are negative.    Past History of the following :    Past Medical History:  Diagnosis Date   [place]    [place]    remission- breast   Glaucoma       Past Surgical History:  Procedure Laterality Date   BLADDER REPAIR     BREAST LUMPECTOMY Left 2009      Social History:      Social History   Tobacco Use   Smoking status: Former Smoker    Years: 3.00    Types: Cigarettes    Quit date: 09/12/1961    Years since quitting: 58.8   Smokeless tobacco: Never Used  Substance Use Topics   Alcohol use: Yes       Family History :  Patient's brother was diagnosed with esophageal cancer.   Home Medications:   Prior to Admission medications   Medication Sig Start Date End Date Taking?  Authorizing Provider  sodium chloride HYPERTONIC 3 % nebulizer solution Take by nebulization daily. 03/31/20  Yes ARigoberto Noel MD  acetaminophen (TYLENOL) 325 MG tablet Take 650 mg by mouth at bedtime.    [provider]  albuterol (VENTOLIN HFA) 108 (90 Base) MCG/ACT inhaler INHALE 2 PUFFS INTO THE LUNGS EVERY 6 HOURS AS NEEDED FOR WHEEZING/SHORNTESS OF BREATH Patient taking differently: Inhale 2 puffs into the lungs every 6 (six) hours as needed for wheezing or shortness of breath.  05/04/20   Comer, ROkey Regal MD  azithromycin (ZITHROMAX) 500 MG tablet Take 1 tablet (500 mg total) by mouth 3 (three) times a week. 01/08/20   Comer, ROkey Regal MD  AZOPT 1 % ophthalmic suspension PUT 1 DROP INTO BOTH EYES TWICE A DAY 07/20/17   [provider]  benzonatate (TESSALON) 200 MG capsule Take 1 capsule (200 mg total) by mouth 3 (three) times daily as needed for cough. Patient not taking: Reported on 04/15/2020 03/09/20  Rigoberto Noel, MD  ethambutol (MYAMBUTOL) 400 MG tablet Take 3 tablets (1,200 mg total) by mouth 3 (three) times a week. 01/08/20   Comer, Okey Regal, MD  latanoprost (XALATAN) 0.005 % ophthalmic solution Place 1 drop into the left eye daily. 04/25/17   [provider]  montelukast (SINGULAIR) 10 MG tablet TAKE 1 TABLET BY MOUTH EVERYDAY AT BEDTIME Patient not taking: TAKE 1 TABLET BY MOUTH EVERYDAY AT BEDTIME 09/02/19   Martyn Ehrich, NP  Respiratory Therapy Supplies (FLUTTER) DEVI 1 Device by Does not apply route 3 (three) times daily. Patient not taking: Reported on 03/09/2020 05/10/18   Martyn Ehrich, NP  rifampin (RIFADIN) 300 MG capsule Take 2 capsules (600 mg total) by mouth 3 (three) times a week. 01/08/20   Comer, Okey Regal, MD     Allergies:     Allergies  Allergen Reactions   Iodine Other (See Comments)    Iodine in eye drops - redness of eyes   Apraclonidine Other (See Comments)    Redness of eye     Physical Exam:   Vitals  Blood  pressure 129/74, pulse 92, temperature 98.2 F (36.8 C), resp. rate 17, height 5' 2.5" (1.588 m), weight 47.6 kg, SpO2 95 %.  1.  General: Appears in no acute distress  2. Psychiatric: Alert, oriented x3, intact insight and judgment  3. Neurologic: Cranial nerves II through XII grossly intact, no focal deficit noted  4. HEENMT:  Atraumatic normocephalic, extraocular muscles are intact  5. Respiratory : Decreased breath sounds at left lung base, fine crackles auscultated  6. Cardiovascular : S1-S2, regular, no murmur auscultated  7. Gastrointestinal:  Abdomen is soft, nontender, no organomegaly  8. Skin:  No rashes noted  9.Musculoskeletal:  No edema in the lower extremities    Data Review:    CBC Recent Labs  Lab 06/27/20 1301  WBC 16.5*  HGB 10.9*  HCT 34.1*  PLT 365  MCV 91.4  MCH 29.2  MCHC 32.0  RDW 13.4  LYMPHSABS 1.7  MONOABS 1.0  EOSABS 0.1  BASOSABS 0.0   ------------------------------------------------------------------------------------------------------------------  Results for orders placed or performed during the hospital encounter of 06/27/20 (from the past 48 hour(s))  Respiratory Panel by RT PCR (Flu A&B, Covid) - Nasopharyngeal Swab     Status: None   Collection Time: 06/27/20  1:01 PM   Specimen: Nasopharyngeal Swab  Result Value Ref Range   SARS Coronavirus 2 by RT PCR NEGATIVE NEGATIVE    Comment: (NOTE) SARS-CoV-2 target nucleic acids are NOT DETECTED.  The SARS-CoV-2 RNA is generally detectable in upper respiratoy specimens during the acute phase of infection. The lowest concentration of SARS-CoV-2 viral copies this assay can detect is 131 copies/mL. A negative result does not preclude SARS-Cov-2 infection and should not be used as the sole basis for treatment or other patient management decisions. A negative result may occur with  improper specimen collection/handling, submission of specimen other than nasopharyngeal swab,  presence of viral mutation(s) within the areas targeted by this assay, and inadequate number of viral copies (<131 copies/mL). A negative result must be combined with clinical observations, patient history, and epidemiological information. The expected result is Negative.  Fact Sheet for Patients:  PinkCheek.be  Fact Sheet for Healthcare Providers:  GravelBags.it  This test is no t yet approved or cleared by the [place] FDA and  has been authorized for detection and/or diagnosis of SARS-CoV-2 by FDA under an Emergency Use Authorization (EUA). This EUA  will remain  in effect (meaning this test can be used) for the duration of the COVID-19 declaration under Section 564(b)(1) of the Act, 21 U.S.C. section 360bbb-3(b)(1), unless the authorization is terminated or revoked sooner.     Influenza A by PCR NEGATIVE NEGATIVE   Influenza B by PCR NEGATIVE NEGATIVE    Comment: (NOTE) The Xpert Xpress SARS-CoV-2/FLU/RSV assay is intended as an aid in  the diagnosis of influenza from Nasopharyngeal swab specimens and  should not be used as a sole basis for treatment. Nasal washings and  aspirates are unacceptable for Xpert Xpress SARS-CoV-2/FLU/RSV  testing.  Fact Sheet for Patients: PinkCheek.be  Fact Sheet for Healthcare Providers: GravelBags.it  This test is not yet approved or cleared by the [place] FDA and  has been authorized for detection and/or diagnosis of SARS-CoV-2 by  FDA under an Emergency Use Authorization (EUA). This EUA will remain  in effect (meaning this test can be used) for the duration of the  Covid-19 declaration under Section 564(b)(1) of the Act, 21  U.S.C. section 360bbb-3(b)(1), unless the authorization is  terminated or revoked. Performed at [facility], [place] [address], [place], [place] [address]   Comprehensive  metabolic panel     Status: Abnormal   Collection Time: 06/27/20  1:01 PM  Result Value Ref Range   Sodium 133 (L) 135 - 145 mmol/L   Potassium 3.7 3.5 - 5.1 mmol/L   Chloride 97 (L) 98 - 111 mmol/L   CO2 24 22 - 32 mmol/L   Glucose, Bld 112 (H) 70 - 99 mg/dL    Comment: Glucose reference range applies only to samples taken after fasting for at least 8 hours.   BUN 11 8 - 23 mg/dL   Creatinine, Ser 0.57 0.44 - 1.00 mg/dL   Calcium 8.7 (L) 8.9 - 10.3 mg/dL   Total Protein 7.5 6.5 - 8.1 g/dL   Albumin 2.9 (L) 3.5 - 5.0 g/dL   AST 29 15 - 41 U/L   ALT 17 0 - 44 U/L   Alkaline Phosphatase 128 (H) 38 - 126 U/L   Total Bilirubin 1.2 0.3 - 1.2 mg/dL   GFR, Estimated >60 >60 mL/min   Anion gap 12 5 - 15    Comment: Performed at [facility], [place] [address], [place], [place] [address]  D-dimer, quantitative     Status: Abnormal   Collection Time: 06/27/20  1:01 PM  Result Value Ref Range   D-Dimer, Quant 4.34 (H) 0.00 - 0.50 ug/mL-FEU    Comment: (NOTE) At the manufacturer cut-off value of 0.5 g/mL FEU, this assay has a negative predictive value of 95-100%.This assay is intended for use in conjunction with a clinical pretest probability (PTP) assessment model to exclude pulmonary embolism (PE) and deep venous thrombosis (DVT) in outpatients suspected of PE or DVT. Results should be correlated with clinical presentation. Performed at [facility], [place] [address] [place], [place] [address]   CBC with Differential     Status: Abnormal   Collection Time: 06/27/20  1:01 PM  Result Value Ref Range   WBC 16.5 (H) 4.0 - 10.5 K/uL   RBC 3.73 (L) 3.87 - 5.11 MIL/uL   Hemoglobin 10.9 (L) 12.0 - 15.0 g/dL   HCT 34.1 (L) 36 - 46 %   MCV 91.4 80.0 - 100.0 fL   MCH 29.2 26.0 - 34.0 pg   MCHC 32.0 30.0 - 36.0 g/dL   RDW 13.4 11.5 -  15.5 %   Platelets 365 150 - 400 K/uL   nRBC 0.0 0.0 - 0.2 %   Neutrophils Relative % 83 %   Neutro Abs 13.6 (H) 1.7 - 7.7  K/uL   Lymphocytes Relative 10 %   Lymphs Abs 1.7 0.7 - 4.0 K/uL   Monocytes Relative 6 %   Monocytes Absolute 1.0 0.1 - 1.0 K/uL   Eosinophils Relative 1 %   Eosinophils Absolute 0.1 0.0 - 0.5 K/uL   Basophils Relative 0 %   Basophils Absolute 0.0 0.0 - 0.1 K/uL   Immature Granulocytes 0 %   Abs Immature Granulocytes 0.07 0.00 - 0.07 K/uL    Comment: Performed at [facility], [place] [address], [place], [place] [address]  Lactic acid, plasma     Status: None   Collection Time: 06/27/20  1:15 PM  Result Value Ref Range   Lactic Acid, Venous 1.0 0.5 - 1.9 mmol/L    Comment: Performed at [facility], [place] [address], [place], [place] [address]    Chemistries  Recent Labs  Lab 06/27/20 1301  NA 133*  K 3.7  CL 97*  CO2 24  GLUCOSE 112*  BUN 11  CREATININE 0.57  CALCIUM 8.7*  AST 29  ALT 17  ALKPHOS 128*  BILITOT 1.2   ------------------------------------------------------------------------------------------------------------------  ------------------------------------------------------------------------------------------------------------------ GFR: Estimated Creatinine Clearance: 41.4 mL/min (by C-G formula based on SCr of 0.57 mg/dL). Liver Function Tests: Recent Labs  Lab 06/27/20 1301  AST 29  ALT 17  ALKPHOS 128*  BILITOT 1.2  PROT 7.5  ALBUMIN 2.9*   No results for input(s): LIPASE, AMYLASE in the last 168 hours. No results for input(s): AMMONIA in the last 168 hours. Coagulation Profile: No results for input(s): INR, PROTIME in the last 168 hours. Cardiac Enzymes: No results for input(s): CKTOTAL, CKMB, CKMBINDEX, TROPONINI in the last 168 hours. BNP (last 3 results) No results for input(s): PROBNP in the last 8760 hours. HbA1C: No results for input(s): HGBA1C in the last 72 hours. CBG: No results for input(s): GLUCAP in the last 168 hours. Lipid Profile: No results for input(s): CHOL, HDL, LDLCALC, TRIG,  CHOLHDL, LDLDIRECT in the last 72 hours. Thyroid Function Tests: No results for input(s): TSH, T4TOTAL, FREET4, T3FREE, THYROIDAB in the last 72 hours. Anemia Panel: No results for input(s): VITAMINB12, FOLATE, FERRITIN, TIBC, IRON, RETICCTPCT in the last 72 hours.  --------------------------------------------------------------------------------------------------------------- Urine analysis: No results found for: COLORURINE, APPEARANCEUR, LABSPEC, PHURINE, GLUCOSEU, HGBUR, BILIRUBINUR, KETONESUR, PROTEINUR, UROBILINOGEN, NITRITE, LEUKOCYTESUR    Imaging Results:    DG Chest 2 View  Result Date: 06/27/2020 CLINICAL DATA:  History of MAC. Rule out rib fracture. Left lower lateral rib pain for 3 days. Productive cough. EXAM: CHEST - 2 VIEW COMPARISON:  May 10, 2018 FINDINGS: No pneumothorax. The heart, hila, mediastinum are normal. The right lung is clear. There is infiltrate and probably a small effusion on the left. No other acute abnormalities are identified. IMPRESSION: Infiltrate in left base worrisome for pneumonia. Atelectasis possible. There appears to be a small associated effusion as well. Electronically Signed   By: Dorise Bullion III M.D   On: 06/27/2020 14:07       Assessment & Plan:    Active Problems:   CAP (community acquired pneumonia)   1. Community-acquired pneumonia-chest x-ray shows infiltrate in left base worrisome for pneumonia. Patient started on ceftriaxone and Zithromax. Follow blood culture results.  2. Elevated D-dimer-patient has allergy to iodine, will avoid giving admitted  contrast/CTA. Will obtain VQ scan. Patient is currently not hypoxic or requiring oxygen. 3. History of bronchiectasis/MAC-patient completed treatment for MAC with antibiotics for 15 months. Continue flutter valve, will start Mucinex 1 tablet p.o. twice daily. DuoNeb nebulizers every 6 hours as needed 4. History of glaucoma-continue Xalatan eyedrops    DVT Prophylaxis-   Lovenox    AM Labs Ordered, also please review Full Orders  Family Communication: Admission, patients condition and plan of care including tests being ordered have been discussed with the patient and her husband who indicate understanding and agree with the plan and Code Status.  Code Status: DNR  Admission status: Inpatient :The appropriate admission status for this patient is INPATIENT. Inpatient status is judged to be reasonable and necessary in order to provide the required intensity of service to ensure the patient's safety. The patient's presenting symptoms, physical exam findings, and initial radiographic and laboratory data in the context of their chronic comorbidities is felt to place them at high risk for further clinical deterioration. Furthermore, it is not anticipated that the patient will be medically stable for discharge from the hospital within 2 midnights of admission. The following factors support the admission status of inpatient.     The patient's presenting symptoms include shortness of breath The worrisome physical exam findings include left lower lobe pneumonia. The initial radiographic and laboratory data are worrisome because of left lower lobe pneumonia. The chronic co-morbidities include MAC, bronchiectasis      * I certify that at the point of admission it is my clinical judgment that the patient will require inpatient hospital care spanning beyond 2 midnights from the point of admission due to high intensity of service, high risk for further deterioration and high frequency of surveillance required.*  Time spent in minutes : 60 minutes   Bethann Qualley S Arbell Wycoff M.D

## 2020-06-27 NOTE — ED Triage Notes (Signed)
Patient c/o left lower lateral rib cage area x 3 days. Patient states a productive cough with white and green sputum. Patient denies a fever.

## 2020-06-27 NOTE — ED Provider Notes (Signed)
[facility] DEPT Provider Note   CSN: [id number] Arrival date & time: 06/27/20  1136     History Chief Complaint  Patient presents with  . rib cage area pain    [name] Rice is a 79 y.o. female.  79 year old woman presents with left-sided rib pain for 4 days.  Patient has past medical history of MAC, completed 58-monthtreatment in May of this year with ethambutol, rifampin, azithromycin.  She still has coughing spells for about 3 minutes at a time, she has a cough at baseline, but it has worsened over the last several days.  She had 1 of these coughing spells and afterward had pain on her left side.  She has continued to have pain, occasional posttussive emesis, and feels warm to the touch today although highest temperature is 100 F in triage.  Patient has had weight loss this year since she came off the antibiotics.  Past medical history includes bronchiectasis, MAC, in remission from breast cancer, glaucoma, GERD.  She denies chills, other chest pain, shortness of breath, runny nose sore throat, change in baseline cough, nausea, vomiting, diarrhea.  Patient is mildly tachycardic to 110s, other vital signs stable.        Past Medical History:  Diagnosis Date  . Bronchiectasis ([facility]   . Cancer ([facility]    remission- breast  . Glaucoma     Patient Active Problem List   Diagnosis Date Noted  . GERD (gastroesophageal reflux disease) 03/26/2019  . Medication monitoring encounter 10/16/2018  . Malignant neoplasm of lower-inner quadrant of left breast in female, estrogen receptor negative ([facility] 01/22/2018  . Pulmonary Mycobacterium avium complex (MAC) infection ([facility] 11/28/2017  . Bronchiectasis without complication ([facility] 159/56/3875 . Hemoptysis 08/31/2017  . CHRONIC ANGLE-CLOSURE GLAUCOMA 08/30/2007  . ALLERGIC RHINITIS DUE TO POLLEN 08/30/2007  . TMJ SYNDROME 11/09/2006  . SCIATICA 11/09/2006    Past Surgical History:  Procedure Laterality  Date  . BLADDER REPAIR    . BREAST LUMPECTOMY Left 2009     OB History   No obstetric history on file.     History reviewed. No pertinent family history.  Social History   Tobacco Use  . Smoking status: Former Smoker    Years: 3.00    Types: Cigarettes    Quit date: 09/12/1961    Years since quitting: 58.8  . Smokeless tobacco: Never Used  Vaping Use  . Vaping Use: Never used  Substance Use Topics  . Alcohol use: Yes  . Drug use: No    Home Medications Prior to Admission medications   Medication Sig Start Date End Date Taking? Authorizing Provider  acetaminophen (TYLENOL) 325 MG tablet Take 650 mg by mouth at bedtime.    [provider]  albuterol (VENTOLIN HFA) 108 (90 Base) MCG/ACT inhaler INHALE 2 PUFFS INTO THE LUNGS EVERY 6 HOURS AS NEEDED FOR WHEEZING/SHORNTESS OF BREATH Patient taking differently: Inhale 2 puffs into the lungs every 6 (six) hours as needed for wheezing or shortness of breath.  05/04/20   Comer, ROkey Regal MD  azithromycin (ZITHROMAX) 500 MG tablet Take 1 tablet (500 mg total) by mouth 3 (three) times a week. 01/08/20   Comer, ROkey Regal MD  AZOPT 1 % ophthalmic suspension PUT 1 DROP INTO BOTH EYES TWICE A DAY 07/20/17   [provider]  benzonatate (TESSALON) 200 MG capsule Take 1 capsule (200 mg total) by mouth 3 (three) times daily as needed for cough. Patient not taking: Reported on  04/15/2020 03/09/20   Rigoberto Noel, MD  ethambutol (MYAMBUTOL) 400 MG tablet Take 3 tablets (1,200 mg total) by mouth 3 (three) times a week. 01/08/20   Comer, Okey Regal, MD  latanoprost (XALATAN) 0.005 % ophthalmic solution Place 1 drop into the left eye daily. 04/25/17   [provider]  montelukast (SINGULAIR) 10 MG tablet TAKE 1 TABLET BY MOUTH EVERYDAY AT BEDTIME Patient not taking: TAKE 1 TABLET BY MOUTH EVERYDAY AT BEDTIME 09/02/19   Martyn Ehrich, NP  Respiratory Therapy Supplies (FLUTTER) DEVI 1 Device by Does not apply route 3 (three)  times daily. Patient not taking: Reported on 03/09/2020 05/10/18   Martyn Ehrich, NP  rifampin (RIFADIN) 300 MG capsule Take 2 capsules (600 mg total) by mouth 3 (three) times a week. 01/08/20   Comer, Okey Regal, MD  sodium chloride HYPERTONIC 3 % nebulizer solution Take by nebulization daily. Patient not taking: Reported on 04/15/2020 03/31/20   Rigoberto Noel, MD    Allergies    Apraclonidine  Review of Systems   Review of Systems  Constitutional: Negative for activity change, appetite change, chills and fever.  HENT: Negative for rhinorrhea and sore throat.   Respiratory: Positive for cough and shortness of breath.   Cardiovascular: Positive for chest pain.       Left rib pain  Gastrointestinal: Negative for abdominal pain, diarrhea, nausea and vomiting.  Musculoskeletal:       L Rib pain  Skin: Negative.   Neurological: Negative for dizziness and headaches.  All other systems reviewed and are negative.   Physical Exam Updated Vital Signs BP (!) 148/75 (BP Location: Left Arm)   Pulse (!) 112   Temp 100 F (37.8 C) (Oral)   Resp 16   Ht 5' 2.5" (1.588 m)   Wt 47.6 kg   SpO2 93%   BMI 18.90 kg/m   Physical Exam Vitals and nursing note reviewed.  Constitutional:      General: She is not in acute distress.    Appearance: Normal appearance. She is ill-appearing. She is not toxic-appearing or diaphoretic.     Comments: Thin-appearing, older [place] resting comfortably in bed, NAD  HENT:     Head: Normocephalic and atraumatic.     Nose: No congestion or rhinorrhea.     Mouth/Throat:     Mouth: Mucous membranes are dry.  Eyes:     Conjunctiva/sclera: Conjunctivae normal.  Cardiovascular:     Rate and Rhythm: Regular rhythm. Tachycardia present.     Pulses: Normal pulses.     Heart sounds: Normal heart sounds.  Pulmonary:     Effort: Pulmonary effort is normal. No respiratory distress.     Breath sounds: No wheezing, rhonchi or rales.     Comments: Decreased breath sounds  on the left side, greater at base than apex Chest:     Chest wall: No tenderness.  Abdominal:     General: Abdomen is flat. Bowel sounds are normal.     Palpations: Abdomen is soft.     Tenderness: There is no abdominal tenderness. There is no guarding or rebound.  Musculoskeletal:     Right lower leg: No edema.     Left lower leg: No edema.  Skin:    General: Skin is warm and dry.     Capillary Refill: Capillary refill takes less than 2 seconds.  Neurological:     General: No focal deficit present.     Mental Status: She is alert. Mental  status is at baseline.  Psychiatric:        Mood and Affect: Mood normal.        Behavior: Behavior normal.     ED Results / Procedures / Treatments   Labs (all labs ordered are listed, but only abnormal results are displayed) Labs Reviewed  COMPREHENSIVE METABOLIC PANEL - Abnormal; Notable for the following components:      Result Value   Sodium 133 (*)    Chloride 97 (*)    Glucose, Bld 112 (*)    Calcium 8.7 (*)    Albumin 2.9 (*)    Alkaline Phosphatase 128 (*)    All other components within normal limits  D-DIMER, QUANTITATIVE (NOT AT [facility]) - Abnormal; Notable for the following components:   D-Dimer, Quant 4.34 (*)    All other components within normal limits  CBC WITH DIFFERENTIAL/PLATELET - Abnormal; Notable for the following components:   WBC 16.5 (*)    RBC 3.73 (*)    Hemoglobin 10.9 (*)    HCT 34.1 (*)    Neutro Abs 13.6 (*)    All other components within normal limits  RESPIRATORY PANEL BY RT PCR (FLU A&B, COVID)  CULTURE, BLOOD (ROUTINE X 2)  CULTURE, BLOOD (ROUTINE X 2)  EXPECTORATED SPUTUM ASSESSMENT W REFEX TO RESP CULTURE  ACID FAST SMEAR (AFB, MYCOBACTERIA)  ACID FAST CULTURE WITH REFLEXED SENSITIVITIES (MYCOBACTERIA)  LACTIC ACID, PLASMA  LACTIC ACID, PLASMA    EKG None  Radiology DG Chest 2 View  Result Date: 06/27/2020 CLINICAL DATA:  History of MAC. Rule out rib fracture. Left lower lateral rib pain  for 3 days. Productive cough. EXAM: CHEST - 2 VIEW COMPARISON:  May 10, 2018 FINDINGS: No pneumothorax. The heart, hila, mediastinum are normal. The right lung is clear. There is infiltrate and probably a small effusion on the left. No other acute abnormalities are identified. IMPRESSION: Infiltrate in left base worrisome for pneumonia. Atelectasis possible. There appears to be a small associated effusion as well. Electronically Signed   By: Dorise Bullion III M.D   On: 06/27/2020 14:07    Procedures Procedures (including critical care time)  Medications Ordered in ED Medications  cefTRIAXone (ROCEPHIN) 2 g in sodium chloride 0.9 % 100 mL IVPB (has no administration in time range)  azithromycin (ZITHROMAX) 500 mg in sodium chloride 0.9 % 250 mL IVPB (has no administration in time range)  acetaminophen (TYLENOL) tablet 1,000 mg (1,000 mg Oral Given 06/27/20 1342)    ED Course  I have reviewed the triage vital signs and the nursing notes.  Pertinent labs & imaging results that were available during my care of the patient were reviewed by me and considered in my medical decision making (see chart for details).    MDM Rules/Calculators/A&P                          79 yo woman w/ PMH of bronchiectasis and MAC s/p 15 months of 3-drug regimen presents with left-sided rib pain x 4 days. DDX includes pneumonia, repeat MAC infection, rib fractures, more remotely but possible PE. Patient is warm to touch and tachycardic, will obtain basic labs including CBC and BMP, suspect infectious cause so will also obtain blood cultures and LA. Will obtain d-dimer to evaluate likelihood of PE, patient has no h/o of blood clot as Well's Criteria is 1.5 points, or low risk.  CXR with no rib fracture, but infiltrate and small effusion consistent  with PNA. Leukocytosis with left shift, indicating bacterial pna. Still awaiting the rest of results, will touch base with ID regarding how to treat this pna: MAC vs CAP.  CURB 65 score 1 for age, low risk.  Discussed case with Dr. Juleen China, ID, who recommended treating patient for CAP and obtaining sputum culture and sending for AFB. Will treat with CTX and azithromycin IV. D-dimer is elevated, but in setting of infection and low Well's criteria, unlikely to be PE. Will hold off on CTA at this time. COVID-19 and influenza negative. Will consult hospitalist for admission.  Consulted hospitalist who will admit patient for CAP.  Final Clinical Impression(s) / ED Diagnoses Final diagnoses:  Community acquired pneumonia of left lower lobe of lung    Rx / DC Orders ED Discharge Orders    None       Gladys Damme, MD 06/27/20 1537    Blanchie Dessert, MD 06/27/20 2009

## 2020-06-28 LAB — COMPREHENSIVE METABOLIC PANEL
ALT: 17 U/L (ref 0–44)
AST: 22 U/L (ref 15–41)
Albumin: 2.6 g/dL — ABNORMAL LOW (ref 3.5–5.0)
Alkaline Phosphatase: 107 U/L (ref 38–126)
Anion gap: 12 (ref 5–15)
BUN: 8 mg/dL (ref 8–23)
CO2: 24 mmol/L (ref 22–32)
Calcium: 8.6 mg/dL — ABNORMAL LOW (ref 8.9–10.3)
Chloride: 103 mmol/L (ref 98–111)
Creatinine, Ser: 0.56 mg/dL (ref 0.44–1.00)
GFR, Estimated: >60 mL/min (ref >60)
Glucose, Bld: 104 mg/dL — ABNORMAL HIGH (ref 70–99)
Potassium: 3.7 mmol/L (ref 3.5–5.1)
Sodium: 139 mmol/L (ref 135–145)
Total Bilirubin: 1.2 mg/dL (ref 0.3–1.2)
Total Protein: 7.1 g/dL (ref 6.5–8.1)

## 2020-06-28 LAB — CBC
HCT: 36 % (ref 36.0–46.0)
Hemoglobin: 11.6 g/dL — ABNORMAL LOW (ref 12.0–15.0)
MCH: 29.7 pg (ref 26.0–34.0)
MCHC: 32.2 g/dL (ref 30.0–36.0)
MCV: 92.1 fL (ref 80.0–100.0)
Platelets: 383 10*3/uL (ref 150–400)
RBC: 3.91 MIL/uL (ref 3.87–5.11)
RDW: 13.3 % (ref 11.5–15.5)
WBC: 14.5 10*3/uL — ABNORMAL HIGH (ref 4.0–10.5)
nRBC: 0 % (ref 0.0–0.2)

## 2020-06-28 LAB — HIV ANTIBODY (ROUTINE TESTING W REFLEX): HIV Screen 4th Generation wRfx: NONREACTIVE

## 2020-06-28 MED ORDER — DIPHENHYDRAMINE HCL 25 MG PO CAPS
50.0000 mg | ORAL_CAPSULE | Freq: Once | ORAL | Status: AC
  Administered 2020-06-29: 01:00:00 50 mg via ORAL
  Filled 2020-06-28: qty 2

## 2020-06-28 MED ORDER — SODIUM CHLORIDE 0.9 % IV SOLN
500.0000 mg | INTRAVENOUS | Status: DC
  Administered 2020-06-28: 17:00:00 500 mg via INTRAVENOUS
  Filled 2020-06-28: qty 500

## 2020-06-28 MED ORDER — PREDNISONE 5 MG PO TABS
50.0000 mg | ORAL_TABLET | Freq: Four times a day (QID) | ORAL | Status: AC
  Administered 2020-06-28 – 2020-06-29 (×3): 50 mg via ORAL
  Filled 2020-06-28 (×3): qty 2

## 2020-06-28 MED ORDER — SODIUM CHLORIDE 0.9 % IV SOLN
2.0000 g | INTRAVENOUS | Status: DC
  Administered 2020-06-28: 17:00:00 2 g via INTRAVENOUS
  Filled 2020-06-28: qty 20

## 2020-06-28 MED ORDER — BRINZOLAMIDE 1 % OP SUSP
1.0000 [drp] | Freq: Two times a day (BID) | OPHTHALMIC | Status: DC
  Administered 2020-06-28 – 2020-06-29 (×3): 1 [drp] via OPHTHALMIC
  Filled 2020-06-28: qty 10

## 2020-06-28 NOTE — Progress Notes (Signed)
[facility]  PROGRESS NOTE  [name] Rice VXY:[id number] DOB: August 05, 1941 DOA: 06/27/2020 PCP: Lavone Orn, MD   Brief HPI:   79 year old female with history of MAC completed 55-monthtreatment in May this year, bronchiectasis, breast cancer status post mastectomy, in remission, glaucoma, GERD came to ED with complaints of shortness of breath. She was found to have left lobe infiltrate on chest x-ray, started on ceftriaxone and Zithromax.    Subjective   Patient seen and examined, she is breathing better. Coughing up more phlegm. VQ scan was done yesterday which was indeterminate.   Assessment/Plan:     1. Community-acquired pneumonia-chest x-ray showed infiltrate in left lung base worrisome for pneumonia. Patient started on ceftriaxone and Zithromax. Follow blood culture results. 2. Elevated D-dimer-CTA could not be done as patient has iodine listed as allergy and eyedrops. VQ scan was ordered which was indeterminate. Will order CTA chest with contrast to rule out pulmonary embolism. Patient will need to be on prednisone per protocol for iodine contrast allergy before CTA can be performed. 3. History of bronchiectasis-patient completed treatment for MAC with antibiotics for 15 months. Continue flutter valve, Mucinex 1 tablet p.o. twice daily. DuoNeb every 6 hours as needed. 4. History of glaucoma-continue Xalatan eyedrops, Azopt eyedrops.     COVID-19 Labs  Recent Labs    06/27/20 1301  DDIMER 4.34*    Lab Results  Component Value Date   SARSCOV2NAA NEGATIVE 06/27/2020     Scheduled medications:   . brinzolamide  1 drop Both Eyes BID  . diphenhydrAMINE  50 mg Oral Once  . enoxaparin (LOVENOX) injection  40 mg Subcutaneous Q24H  . Flutter  1 Device Does not apply TID  . guaiFENesin  600 mg Oral BID  . latanoprost  1 drop Left Eye QHS  . predniSONE  50 mg Oral Q6H  . sodium chloride flush  3 mL Intravenous Q12H         CBG: No results for input(s):  GLUCAP in the last 168 hours.  SpO2: 97 %    CBC: Recent Labs  Lab 06/27/20 1301 06/28/20 0500  WBC 16.5* 14.5*  NEUTROABS 13.6*  --   HGB 10.9* 11.6*  HCT 34.1* 36.0  MCV 91.4 92.1  PLT 365 3827   Basic Metabolic Panel: Recent Labs  Lab 06/27/20 1301 06/28/20 0500  NA 133* 139  K 3.7 3.7  CL 97* 103  CO2 24 24  GLUCOSE 112* 104*  BUN 11 8  CREATININE 0.57 0.56  CALCIUM 8.7* 8.6*     Liver Function Tests: Recent Labs  Lab 06/27/20 1301 06/28/20 0500  AST 29 22  ALT 17 17  ALKPHOS 128* 107  BILITOT 1.2 1.2  PROT 7.5 7.1  ALBUMIN 2.9* 2.6*     Antibiotics: Anti-infectives (From admission, onward)   Start     Dose/Rate Route Frequency Ordered Stop   06/28/20 1700  cefTRIAXone (ROCEPHIN) 2 g in sodium chloride 0.9 % 100 mL IVPB        2 g 200 mL/hr over 30 Minutes Intravenous Every 24 hours 06/28/20 1205 07/02/20 1659   06/28/20 1700  azithromycin (ZITHROMAX) 500 mg in sodium chloride 0.9 % 250 mL IVPB        500 mg 250 mL/hr over 60 Minutes Intravenous Every 24 hours 06/28/20 1205 07/02/20 1659   06/28/20 0700  cefTRIAXone (ROCEPHIN) 2 g in sodium chloride 0.9 % 100 mL IVPB  Status:  Discontinued  2 g 200 mL/hr over 30 Minutes Intravenous Every 24 hours 06/27/20 2055 06/28/20 1205   06/28/20 0700  azithromycin (ZITHROMAX) 500 mg in sodium chloride 0.9 % 250 mL IVPB  Status:  Discontinued        500 mg 250 mL/hr over 60 Minutes Intravenous Every 24 hours 06/27/20 2055 06/28/20 1205   06/27/20 1530  cefTRIAXone (ROCEPHIN) 2 g in sodium chloride 0.9 % 100 mL IVPB        2 g 200 mL/hr over 30 Minutes Intravenous  Once 06/27/20 1526 06/27/20 1624   06/27/20 1530  azithromycin (ZITHROMAX) 500 mg in sodium chloride 0.9 % 250 mL IVPB        500 mg 250 mL/hr over 60 Minutes Intravenous  Once 06/27/20 1526 06/27/20 1839       DVT prophylaxis: Lovenox  Code Status: Full code  Family Communication: Discussed with patient's husband at  bedside   Consultants:    Procedures:    Objective   Vitals:   06/28/20 0400 06/28/20 0430 06/28/20 0656 06/28/20 0834  BP: 124/72 133/72 137/69 134/65  Pulse: (!) 39 90 99 95  Resp:   18 16  Temp:   98.5 F (36.9 C) 98.9 F (37.2 C)  TempSrc:   Oral Oral  SpO2: 97% 96% 97% 97%  Weight:      Height:        Intake/Output Summary (Last 24 hours) at 06/28/2020 1446 Last data filed at 06/28/2020 1400 Gross per 24 hour  Intake 1650.56 ml  Output --  Net 1650.56 ml    10/15 1901 - 10/17 0700 In: 335.1  Out: -   Filed Weights   06/27/20 1145  Weight: 47.6 kg    Physical Examination:    General: Appears in no acute distress  Cardiovascular: S1-S2, regular, no murmur auscultated  Respiratory: Left lower lobe crackles auscultated  Abdomen: Abdomen is soft, nontender, no organomegaly  Extremities: No edema in the lower extremities  Neurologic: Cranial nerves II through XII grossly intact, no focal deficit noted   Status is: Inpatient  Dispo: The patient is from: Home              Anticipated d/c is to: Home              Anticipated d/c date is: 06/29/2020              Patient currently not medically stable for discharge  Barrier to discharge-ongoing treatment for pneumonia            Data Reviewed:   Recent Results (from the past 240 hour(s))  Respiratory Panel by RT PCR (Flu A&B, Covid) - Nasopharyngeal Swab     Status: None   Collection Time: 06/27/20  1:01 PM   Specimen: Nasopharyngeal Swab  Result Value Ref Range Status   SARS Coronavirus 2 by RT PCR NEGATIVE NEGATIVE Final    Comment: (NOTE) SARS-CoV-2 target nucleic acids are NOT DETECTED.  The SARS-CoV-2 RNA is generally detectable in upper respiratoy specimens during the acute phase of infection. The lowest concentration of SARS-CoV-2 viral copies this assay can detect is 131 copies/mL. A negative result does not preclude SARS-Cov-2 infection and should not be used as the sole  basis for treatment or other patient management decisions. A negative result may occur with  improper specimen collection/handling, submission of specimen other than nasopharyngeal swab, presence of viral mutation(s) within the areas targeted by this assay, and inadequate number of viral copies (<131 copies/mL).  A negative result must be combined with clinical observations, patient history, and epidemiological information. The expected result is Negative.  Fact Sheet for Patients:  PinkCheek.be  Fact Sheet for Healthcare Providers:  GravelBags.it  This test is no t yet approved or cleared by the [place] FDA and  has been authorized for detection and/or diagnosis of SARS-CoV-2 by FDA under an Emergency Use Authorization (EUA). This EUA will remain  in effect (meaning this test can be used) for the duration of the COVID-19 declaration under Section 564(b)(1) of the Act, 21 U.S.C. section 360bbb-3(b)(1), unless the authorization is terminated or revoked sooner.     Influenza A by PCR NEGATIVE NEGATIVE Final   Influenza B by PCR NEGATIVE NEGATIVE Final    Comment: (NOTE) The Xpert Xpress SARS-CoV-2/FLU/RSV assay is intended as an aid in  the diagnosis of influenza from Nasopharyngeal swab specimens and  should not be used as a sole basis for treatment. Nasal washings and  aspirates are unacceptable for Xpert Xpress SARS-CoV-2/FLU/RSV  testing.  Fact Sheet for Patients: PinkCheek.be  Fact Sheet for Healthcare Providers: GravelBags.it  This test is not yet approved or cleared by the [place] FDA and  has been authorized for detection and/or diagnosis of SARS-CoV-2 by  FDA under an Emergency Use Authorization (EUA). This EUA will remain  in effect (meaning this test can be used) for the duration of the  Covid-19 declaration under Section 564(b)(1) of the  Act, 21  U.S.C. section 360bbb-3(b)(1), unless the authorization is  terminated or revoked. Performed at [facility], [place] [address] [place], [place] [address]   Culture, blood (routine x 2)     Status: None (Preliminary result)   Collection Time: 06/27/20  1:15 PM   Specimen: BLOOD  Result Value Ref Range Status   Specimen Description   Final    BLOOD RIGHT ANTECUBITAL Performed at [facility] [address] [place], [place] [address]    Special Requests   Final    BOTTLES DRAWN AEROBIC AND ANAEROBIC Blood Culture adequate volume Performed at [facility] [address] [place], [place] [address]    Culture   Final    NO GROWTH < 12 HOURS Performed at [facility] [address] [place], [place] [address]    Report Status PENDING  Incomplete    No results for input(s): LIPASE, AMYLASE in the last 168 hours. No results for input(s): AMMONIA in the last 168 hours.  Cardiac Enzymes: No results for input(s): CKTOTAL, CKMB, CKMBINDEX, TROPONINI in the last 168 hours. BNP (last 3 results) No results for input(s): BNP in the last 8760 hours.  ProBNP (last 3 results) No results for input(s): PROBNP in the last 8760 hours.  Studies:  DG Chest 2 View  Result Date: 06/27/2020 CLINICAL DATA:  History of MAC. Rule out rib fracture. Left lower lateral rib pain for 3 days. Productive cough. EXAM: CHEST - 2 VIEW COMPARISON:  May 10, 2018 FINDINGS: No pneumothorax. The heart, hila, mediastinum are normal. The right lung is clear. There is infiltrate and probably a small effusion on the left. No other acute abnormalities are identified. IMPRESSION: Infiltrate in left base worrisome for pneumonia. Atelectasis possible. There appears to be a small associated effusion as well. Electronically Signed   By: Dorise Bullion III M.D   On: 06/27/2020 14:07   NM Pulmonary Perfusion  Result Date: 06/27/2020 CLINICAL DATA:   Left-sided chest pain with coughing. Shortness  of breath. EXAM: NUCLEAR MEDICINE PERFUSION LUNG SCAN TECHNIQUE: Perfusion images were obtained in multiple projections after intravenous injection of radiopharmaceutical. Ventilation scans intentionally deferred if perfusion scan and chest x-ray adequate for interpretation during COVID 19 epidemic. RADIOPHARMACEUTICALS:  4.4 mCi Tc-61mMAA IV COMPARISON:  Current chest radiograph and prior exams including a chest CT from 01/23/2020. FINDINGS: There are multiple small segmental and nonsegmental pulmonary perfusion defects bilaterally. IMPRESSION: 1. Findings are suspicious for multiple pulmonary emboli reflected by multiple small segmental perfusion defects. However, there also non segmental appearing perfusion defects. Findings may all be due to the patient's significant chronic lung disease. Consider follow-up CT angiography if the patient can tolerate this procedure. Electronically Signed   By: DLajean ManesM.D.   On: 06/27/2020 19:05       [place]  [facility] If 7PM-7AM, please contact night-coverage at [email or web address], Office  [phone]  06/28/2020, 2:46 PM  LOS: 1 day

## 2020-06-28 NOTE — ED Notes (Signed)
Patient eating breakfast at this time and then will transport upstairs to room.

## 2020-06-28 NOTE — ED Notes (Signed)
Report given to [facility]

## 2020-06-29 ENCOUNTER — Inpatient Hospital Stay (HOSPITAL_COMMUNITY): Payer: Medicare Other

## 2020-06-29 ENCOUNTER — Encounter (HOSPITAL_COMMUNITY): Payer: Self-pay | Admitting: Family Medicine

## 2020-06-29 MED ORDER — AMOXICILLIN-POT CLAVULANATE 875-125 MG PO TABS: 1.0000 | ORAL_TABLET | Freq: Two times a day (BID) | ORAL | 0 refills | Status: AC

## 2020-06-29 MED ORDER — GUAIFENESIN ER 600 MG PO TB12
600.0000 mg | ORAL_TABLET | Freq: Two times a day (BID) | ORAL | 2 refills | Status: DC
Start: 2020-06-29 — End: 2021-11-24

## 2020-06-29 MED ORDER — IOHEXOL 350 MG/ML SOLN
100.0000 mL | Freq: Once | INTRAVENOUS | Status: AC | PRN
  Administered 2020-06-29: 01:00:00 100 mL via INTRAVENOUS

## 2020-06-29 NOTE — Progress Notes (Signed)
Discharge instructions given to pt and all questions were answered.

## 2020-06-29 NOTE — Discharge Summary (Addendum)
Physician Discharge Summary  [name] Rice YHC:[id number] DOB: [birth year] DOA: 06/27/2020  PCP: Lavone Orn, MD  Admit date: 06/27/2020 Discharge date: 06/29/2020  Time spent: 50* minutes  Recommendations for Outpatient Follow-up:  1. 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 She was found to have left lobe infiltrate on chest x-ray, started on ceftriaxone and Zithromax.   Hospital Course:  1. *Community-acquired pneumonia-chest x-ray showed infiltrate in left lung base worrisome for pneumonia. Patient started on ceftriaxone and Zithromax.  Clinically she has improved.  CTA chest was done which was negative for pulmonary embolism and showed left lower lobe infiltrate consistent with pneumonia.  Will discharge home on Augmentin 1 tablet p.o. twice daily for 7 more days. 2. Elevated D-dimer-CTA could not be done as patient has iodine listed as allergy and eyedrops. VQ scan was ordered which was indeterminate.  CTA chest with contrast was ordered which was negative for pulmonary embolism.  Patient was given prednisone per protocol for iodine contrast allergy before CTA. 3. History of bronchiectasis-patient completed treatment for MAC with antibiotics for 15 months. Continue flutter valve, Mucinex 1 tablet p.o. twice daily.  4. History of glaucoma-continue Xalatan eyedrops, Azopt eyedrops.  Procedures:    Consultations:    Discharge Exam: Vitals:   06/29/20 0125 06/29/20 0515  BP: 136/74 135/79  Pulse: 88 80  Resp:  15  Temp: 97.7 F (36.5 C)  (!) 97.1 F (36.2 C)  SpO2: 99% 98%    General: Appears in no acute distress Cardiovascular: S1-S2, regular Respiratory: Crackles auscultated at left lung base  Discharge Instructions   Discharge Instructions    Diet - low sodium heart healthy   Complete by: As directed    Increase activity slowly   Complete by: As directed      Allergies as of 06/29/2020      Reactions   Iodine Other (See Comments)   Iodine in eye drops - redness of eyes   Apraclonidine Other (See Comments)   Redness of eye      Medication List    STOP taking these medications   azithromycin 500 MG tablet Commonly known as: ZITHROMAX   ethambutol 400 MG tablet Commonly known as: MYAMBUTOL   Flutter Devi   rifampin 300 MG capsule Commonly known as: RIFADIN     TAKE these medications   acetaminophen 325 MG tablet Commonly known as: TYLENOL Take 325 mg by mouth at bedtime.   albuterol 108 (90 Base) MCG/ACT inhaler Commonly known as: VENTOLIN HFA INHALE 2 PUFFS INTO THE LUNGS EVERY 6 HOURS AS NEEDED FOR WHEEZING/SHORNTESS OF BREATH What changed: See the new instructions.   amoxicillin-clavulanate 875-125 MG tablet Commonly known as: Augmentin Take 1 tablet by mouth 2 (two) times daily for 7 days.   Azopt 1 % ophthalmic suspension Generic drug: brinzolamide Place 1 drop into both eyes in the morning and at bedtime.   benzonatate 200 MG capsule Commonly known as: TESSALON Take 1 capsule (200 mg total) by mouth 3 (three) times daily as needed for cough.   guaiFENesin 600 MG 12 hr tablet  Commonly known as: MUCINEX Take 1 tablet (600 mg total) by mouth 2 (two) times daily.   latanoprost 0.005 % ophthalmic solution Commonly known as: XALATAN Place 1 drop into both eyes at bedtime.   montelukast 10 MG tablet Commonly known as: SINGULAIR TAKE 1 TABLET BY MOUTH EVERYDAY AT BEDTIME   sodium chloride HYPERTONIC 3 % nebulizer solution Take by nebulization daily.      Allergies   Allergen Reactions  . Iodine Other (See Comments)    Iodine in eye drops - redness of eyes  . Apraclonidine Other (See Comments)    Redness of eye      The results of significant diagnostics from this hospitalization (including imaging, microbiology, ancillary and laboratory) are listed below for reference.    Significant Diagnostic Studies: DG Chest 2 View  Result Date: 06/27/2020 CLINICAL DATA:  History of MAC. Rule out rib fracture. Left lower lateral rib pain for 3 days. Productive cough. EXAM: CHEST - 2 VIEW COMPARISON:  May 10, 2018 FINDINGS: No pneumothorax. The heart, hila, mediastinum are normal. The right lung is clear. There is infiltrate and probably a small effusion on the left. No other acute abnormalities are identified. IMPRESSION: Infiltrate in left base worrisome for pneumonia. Atelectasis possible. There appears to be a small associated effusion as well. Electronically Signed   By: Dorise Bullion III M.D   On: 06/27/2020 14:07   CT ANGIO CHEST PE W OR WO CONTRAST  Result Date: 06/29/2020 CLINICAL DATA:  Community-acquired pneumonia. Elevated D-dimer. Pulmonary embolus is suspected with high probability. EXAM: CT ANGIOGRAPHY CHEST WITH CONTRAST TECHNIQUE: Multidetector CT imaging of the chest was performed using the standard protocol during bolus administration of intravenous contrast. Multiplanar CT image reconstructions and MIPs were obtained to evaluate the vascular anatomy. CONTRAST:  146m OMNIPAQUE IOHEXOL 350 MG/ML SOLN COMPARISON:  01/23/2020 FINDINGS: Cardiovascular: Good opacification of the central and segmental pulmonary arteries. No focal filling defects demonstrated. No evidence of significant pulmonary embolus. Motion artifact limits evaluation. Mild cardiac enlargement. Calcification in the aorta and coronary arteries. No aortic aneurysm or dissection. Great vessel origins are patent. Mediastinum/Nodes: Esophagus is decompressed. Mediastinal lymph nodes are  not pathologically enlarged. Lungs/Pleura: Patchy nodular infiltrates are demonstrated throughout both lungs, most prominent in the bases. There is associated bronchiectasis. Mucous plugging and peribronchial thickening. Similar changes have been present on previous studies, likely representing bronchopneumonia, possibly due to atypical mycobacterium. There is progression of these changes since previous study. In addition, there is a left pleural effusion with basilar consolidation or atelectasis. This is new since the previous study and may represent superimposed pneumonia. Upper Abdomen: Visualized upper abdominal contents are grossly unremarkable. Musculoskeletal: Mild degenerative changes in the spine. No destructive bone lesions. Review of the MIP images confirms the above findings. IMPRESSION: 1. No evidence of significant pulmonary embolus. 2. Patchy nodular infiltrates throughout both lungs, most prominent in the bases, with associated bronchiectasis, mucous plugging, and peribronchial thickening. Similar changes have been present on previous studies, although there is interval progression. Likely representing bronchopneumonia, possibly due to atypical mycobacterium. 3. New left pleural effusion with basilar consolidation or atelectasis, suggesting superimposed pneumonia. 4. Aortic atherosclerosis. Aortic Atherosclerosis (ICD10-I70.0). Electronically Signed   By: WLucienne CapersM.D.   On: 06/29/2020 01:34   NM Pulmonary Perfusion  Result Date: 06/27/2020 CLINICAL DATA:  Left-sided chest pain with coughing. Shortness of breath. EXAM: NUCLEAR MEDICINE PERFUSION LUNG SCAN TECHNIQUE: Perfusion images were obtained in multiple projections after intravenous injection of radiopharmaceutical. Ventilation scans intentionally  deferred if perfusion scan and chest x-ray adequate for interpretation during COVID 19 epidemic. RADIOPHARMACEUTICALS:  4.4 mCi Tc-52mMAA IV COMPARISON:  Current chest radiograph and  prior exams including a chest CT from 01/23/2020. FINDINGS: There are multiple small segmental and nonsegmental pulmonary perfusion defects bilaterally. IMPRESSION: 1. Findings are suspicious for multiple pulmonary emboli reflected by multiple small segmental perfusion defects. However, there also non segmental appearing perfusion defects. Findings may all be due to the patient's significant chronic lung disease. Consider follow-up CT angiography if the patient can tolerate this procedure. Electronically Signed   By: DLajean ManesM.D.   On: 06/27/2020 19:05    Microbiology: Recent Results (from the past 240 hour(s))  Respiratory Panel by RT PCR (Flu A&B, Covid) - Nasopharyngeal Swab     Status: None   Collection Time: 06/27/20  1:01 PM   Specimen: Nasopharyngeal Swab  Result Value Ref Range Status   SARS Coronavirus 2 by RT PCR NEGATIVE NEGATIVE Final    Comment: (NOTE) SARS-CoV-2 target nucleic acids are NOT DETECTED.  The SARS-CoV-2 RNA is generally detectable in upper respiratoy specimens during the acute phase of infection. The lowest concentration of SARS-CoV-2 viral copies this assay can detect is 131 copies/mL. A negative result does not preclude SARS-Cov-2 infection and should not be used as the sole basis for treatment or other patient management decisions. A negative result may occur with  improper specimen collection/handling, submission of specimen other than nasopharyngeal swab, presence of viral mutation(s) within the areas targeted by this assay, and inadequate number of viral copies (<131 copies/mL). A negative result must be combined with clinical observations, patient history, and epidemiological information. The expected result is Negative.  Fact Sheet for Patients:  hPinkCheek.be Fact Sheet for Healthcare Providers:  hGravelBags.it This test is no t yet approved or cleared by the [place] and  has  been authorized for detection and/or diagnosis of SARS-CoV-2 by FDA under an Emergency Use Authorization (EUA). This EUA will remain  in effect (meaning this test can be used) for the duration of the COVID-19 declaration under Section 564(b)(1) of the Act, 21 U.S.C. section 360bbb-3(b)(1), unless the authorization is terminated or revoked sooner.     Influenza A by PCR NEGATIVE NEGATIVE Final   Influenza B by PCR NEGATIVE NEGATIVE Final    Comment: (NOTE) The Xpert Xpress SARS-CoV-2/FLU/RSV assay is intended as an aid in  the diagnosis of influenza from Nasopharyngeal swab specimens and  should not be used as a sole basis for treatment. Nasal washings and  aspirates are unacceptable for Xpert Xpress SARS-CoV-2/FLU/RSV  testing.  Fact Sheet for Patients: hPinkCheek.be Fact Sheet for Healthcare Providers: hGravelBags.it This test is not yet approved or cleared by the [place] and  has been authorized for detection and/or diagnosis of SARS-CoV-2 by  FDA under an Emergency Use Authorization (EUA). This EUA will remain  in effect (meaning this test can be used) for the duration of the  Covid-19 declaration under Section 564(b)(1) of the Act, 21  U.S.C. section 360bbb-3(b)(1), unless the authorization is  terminated or revoked. Performed at [facility] [address] [place]  [id number]  Culture, blood (routine x 2)     Status: None (Preliminary result)   Collection Time: 06/27/20  1:15 PM   Specimen: BLOOD  Result Value Ref Range Status   Specimen Description   Final    BLOOD RIGHT ANTECUBITAL Performed at [facility]  [place] [address], [place], [place] [address]    Special Requests   Final    BOTTLES DRAWN AEROBIC AND ANAEROBIC Blood Culture adequate volume Performed at [facility] [address], [place], [place] [address]    Culture   Final     NO GROWTH < 12 HOURS Performed at [facility] [address] [place], [place] [address]    Report Status PENDING  Incomplete     Labs: Basic Metabolic Panel: Recent Labs  Lab 06/27/20 1301 06/28/20 0500  NA 133* 139  K 3.7 3.7  CL 97* 103  CO2 24 24  GLUCOSE 112* 104*  BUN 11 8  CREATININE 0.57 0.56  CALCIUM 8.7* 8.6*   Liver Function Tests: Recent Labs  Lab 06/27/20 1301 06/28/20 0500  AST 29 22  ALT 17 17  ALKPHOS 128* 107  BILITOT 1.2 1.2  PROT 7.5 7.1  ALBUMIN 2.9* 2.6*   No results for input(s): LIPASE, AMYLASE in the last 168 hours. No results for input(s): AMMONIA in the last 168 hours. CBC: Recent Labs  Lab 06/27/20 1301 06/28/20 0500  WBC 16.5* 14.5*  NEUTROABS 13.6*  --   HGB 10.9* 11.6*  HCT 34.1* 36.0  MCV 91.4 92.1  PLT 365 383       Signed:  Oswald Hillock MD.  [facility] 06/29/2020, 8:36 AM

## 2020-07-02 DIAGNOSIS — R000 Tachycardia, unspecified: Secondary | ICD-10-CM | POA: Diagnosis not present

## 2020-07-02 DIAGNOSIS — J189 Pneumonia, unspecified organism: Secondary | ICD-10-CM | POA: Diagnosis not present

## 2020-07-02 LAB — CULTURE, BLOOD (ROUTINE X 2)
Culture: NO GROWTH
Special Requests: ADEQUATE

## 2020-07-08 ENCOUNTER — Other Ambulatory Visit: Payer: Self-pay | Admitting: Internal Medicine

## 2020-07-16 ENCOUNTER — Other Ambulatory Visit: Payer: Self-pay | Admitting: Internal Medicine

## 2020-07-16 ENCOUNTER — Ambulatory Visit
Admission: RE | Admit: 2020-07-16 | Discharge: 2020-07-16 | Disposition: A | Discharge status: Home / Self Care | Payer: Medicare Other | Source: Ambulatory Visit | Attending: Internal Medicine | Admitting: Internal Medicine

## 2020-07-16 DIAGNOSIS — J189 Pneumonia, unspecified organism: Secondary | ICD-10-CM

## 2020-07-16 DIAGNOSIS — R0602 Shortness of breath: Secondary | ICD-10-CM | POA: Diagnosis not present

## 2020-07-21 ENCOUNTER — Telehealth: Payer: Self-pay | Admitting: Pulmonary Disease

## 2020-07-21 NOTE — Telephone Encounter (Signed)
Recent admission for PNA. F/u CXR on 07/17/2020. Patient is concerned about MAC worsening. She also has a productive cough with green mucus and weigh loss. Sx have been present for years.  [facility] recommended that she see Dr. Elsworth Soho. Patient declined appt with NP.   RA, please advise, as you do not have any availability until 08/19/2020.

## 2020-07-21 NOTE — Telephone Encounter (Signed)
Please let her know that I have reviewed her hospital course and chest x-rays and CT scan. Appears to be pneumonia which is resolving. I note that medical treatment was stopped 04/2020 after 16 months Obtain appointment with me in first week of December and we will repeat chest x-ray and review. Call us back if any worsening

## 2020-07-21 NOTE — Telephone Encounter (Signed)
Patient is aware of below message and voiced her understanding. appt scheduled for 08/19/20 at 2:00. Nothing further needed.

## 2020-08-13 ENCOUNTER — Other Ambulatory Visit: Payer: Self-pay | Admitting: Internal Medicine

## 2020-08-13 ENCOUNTER — Ambulatory Visit
Admission: RE | Admit: 2020-08-13 | Discharge: 2020-08-13 | Disposition: A | Discharge status: Home / Self Care | Payer: Medicare Other | Source: Ambulatory Visit | Attending: Internal Medicine | Admitting: Internal Medicine

## 2020-08-13 DIAGNOSIS — J479 Bronchiectasis, uncomplicated: Secondary | ICD-10-CM

## 2020-08-13 DIAGNOSIS — R059 Cough, unspecified: Secondary | ICD-10-CM | POA: Diagnosis not present

## 2020-08-19 ENCOUNTER — Encounter: Payer: Self-pay | Admitting: Pulmonary Disease

## 2020-08-19 ENCOUNTER — Ambulatory Visit (INDEPENDENT_AMBULATORY_CARE_PROVIDER_SITE_OTHER): Payer: Medicare Other | Admitting: Pulmonary Disease

## 2020-08-19 ENCOUNTER — Other Ambulatory Visit: Payer: Self-pay

## 2020-08-19 DIAGNOSIS — J479 Bronchiectasis, uncomplicated: Secondary | ICD-10-CM | POA: Diagnosis not present

## 2020-08-19 DIAGNOSIS — A310 Pulmonary mycobacterial infection: Secondary | ICD-10-CM | POA: Diagnosis not present

## 2020-08-19 DIAGNOSIS — J189 Pneumonia, unspecified organism: Secondary | ICD-10-CM | POA: Diagnosis not present

## 2020-08-19 MED ORDER — BENZONATATE 200 MG PO CAPS: 200.0000 mg | ORAL_CAPSULE | Freq: Three times a day (TID) | ORAL | 1 refills | Status: DC | PRN

## 2020-08-19 NOTE — Assessment & Plan Note (Signed)
-  Resolved on chest x-ray, loculated effusion is also resolved Unfortunately this has left her somewhat debilitated, hopefully she can rebound soon

## 2020-08-19 NOTE — Assessment & Plan Note (Signed)
Triple therapy was stopped 04/2020 after 15 months Too early for recurrence but we will continue to obtain sputum's when able Weight loss is the main concern -I have asked her to take Ensure at least 3-4 times a week

## 2020-08-19 NOTE — Assessment & Plan Note (Signed)
Continue airway clearance measures -Flutter valve -Hypertonic saline nebs twice daily -Can cut down on Mucinex  For cough suppression, recommend Tessalon Perles and Delsym as needed especially if she is to go out or his company

## 2020-08-19 NOTE — Patient Instructions (Signed)
  Please take Ensure supplements daily. Pneumonia appears to have resolved.  Prescription for benzonatate Perles 200 mg twice daily as needed for cough. Okay to take 5 ml of Delsym if you are going to have company to prevent cough  Call me if you develop green/yellow sputum or fevers

## 2020-08-19 NOTE — Progress Notes (Signed)
Subjective:    Patient ID: [name] Rice, female    DOB: March 20, 1941, 79 y.o.   MRN: [id number]  HPI  28 yoremote smokerFor FU of bronchiectasisdue to MAC She initially presented withhemoptysis Completed course of three times weekly rifampim, ethambutol and azithromycin 12/2018 >> 04/2020  Chief Complaint  Patient presents with  . Follow-up    Patient still has productive cough with thick green sputum and sometimes can be thick white. Admitted to hospital on 10/16 for pneumonia. Some shortness of breath with exertion.   Triple therapy was stopped 04/2020.  PFTs did not show any drop in lung function She remains on a regimen of hypertonic saline nebs twice daily and Mucinex.  Unfortunately she was hospitalized for left lower lobe community-acquired pneumonia 06/27/2020 This was treated with antibiotics, improved and then worsened again requiring longer course of antibiotics. Accompanied by her daughter, pediatrician Chest x-ray 12/2 independently reviewed which shows resolution of left lower lobe infiltrate She has lost weight about 10 pounds from _0 to 107 pounds  Main issues are -Chronic cough which is sometimes embarrassing -Shortness of breath, progressive daily activities -Weight loss  Significant tests/ events reviewed  CTA chest 06/2020 >> left lower lobe infiltrate with small loculated effusion HRCT 01/2020 extensive bronchiectasis, mucoid impaction, tree-in-bud opacities and subpleural postinfectious scarring. No appreciable change   Allergy panel positive for various allergens including dogs, birch, oak, ragweed.  PFTs 03/2020-ratio 71, FEV1 87%, FVC 91%, DLCO 79%   PFTs 12/2017 ratio 68, FEV1 87%, FVC 92%, TLC nml, DLCO 68%  PFTs2016 FEV1 FVC 66 with FEV1 2.09 (102% of predicted), normal MVV normal lung volumes and normal DLCO 2016: Normal, unchanged except mild obstruction with FEV1 1.71/87%   2011. CT reveals bilateral tree-in-bud opacities more  prominent in upper lobe and middle lobe Ct chest 05/14 - bil lower lobe opacities 08/2017 CT angio neg PE,Interval increase of diffuse nodular opacities with areas of bronchiectasis within the lung apices, the right middle lobe and the lingula.S/o MAI  07/2018 worsening bronchiectasis  Review of Systems neg for any significant sore throat, dysphagia, itching, sneezing, nasal congestion or excess/ purulent secretions, fever, chills, sweats, unintended wt loss, pleuritic or exertional cp, hempoptysis, orthopnea pnd or change in chronic leg swelling. Also denies presyncope, palpitations, heartburn, abdominal pain, nausea, vomiting, diarrhea or change in bowel or urinary habits, dysuria,hematuria, rash, arthralgias, visual complaints, headache, numbness weakness or ataxia.     Objective:   Physical Exam  Gen. Pleasant, thin, in no distress ENT - no thrush, no pallor/icterus,no post nasal drip Neck: No JVD, no thyromegaly, no carotid bruits Lungs: no use of accessory muscles, no dullness to percussion, bibasal rales no rhonchi  Cardiovascular: Rhythm regular, heart sounds  normal, no murmurs or gallops, no peripheral edema Musculoskeletal: No deformities, no cyanosis or clubbing        Assessment & Plan:

## 2020-10-22 DIAGNOSIS — H401112 Primary open-angle glaucoma, right eye, moderate stage: Secondary | ICD-10-CM | POA: Diagnosis not present

## 2020-10-22 DIAGNOSIS — H401123 Primary open-angle glaucoma, left eye, severe stage: Secondary | ICD-10-CM | POA: Diagnosis not present

## 2020-10-22 DIAGNOSIS — H2511 Age-related nuclear cataract, right eye: Secondary | ICD-10-CM | POA: Diagnosis not present

## 2020-10-30 DIAGNOSIS — J479 Bronchiectasis, uncomplicated: Secondary | ICD-10-CM | POA: Diagnosis not present

## 2020-10-30 DIAGNOSIS — E440 Moderate protein-calorie malnutrition: Secondary | ICD-10-CM | POA: Diagnosis not present

## 2020-10-30 DIAGNOSIS — J4520 Mild intermittent asthma, uncomplicated: Secondary | ICD-10-CM | POA: Diagnosis not present

## 2020-10-30 DIAGNOSIS — Z0000 Encounter for general adult medical examination without abnormal findings: Secondary | ICD-10-CM | POA: Diagnosis not present

## 2020-10-30 DIAGNOSIS — Z853 Personal history of malignant neoplasm of breast: Secondary | ICD-10-CM | POA: Diagnosis not present

## 2020-10-30 DIAGNOSIS — Z23 Encounter for immunization: Secondary | ICD-10-CM | POA: Diagnosis not present

## 2020-10-30 DIAGNOSIS — Z1389 Encounter for screening for other disorder: Secondary | ICD-10-CM | POA: Diagnosis not present

## 2020-11-06 ENCOUNTER — Ambulatory Visit: Payer: Medicare Other | Admitting: Pulmonary Disease

## 2020-11-10 ENCOUNTER — Other Ambulatory Visit: Payer: Self-pay

## 2020-11-10 ENCOUNTER — Ambulatory Visit (INDEPENDENT_AMBULATORY_CARE_PROVIDER_SITE_OTHER): Payer: Medicare Other | Admitting: Pulmonary Disease

## 2020-11-10 ENCOUNTER — Encounter: Payer: Self-pay | Admitting: Pulmonary Disease

## 2020-11-10 VITALS — BP 130/74 | HR 85 | Temp 98.0°F | Ht 62.5 in | Wt 111.4 lb

## 2020-11-10 DIAGNOSIS — J479 Bronchiectasis, uncomplicated: Secondary | ICD-10-CM

## 2020-11-10 DIAGNOSIS — E440 Moderate protein-calorie malnutrition: Secondary | ICD-10-CM | POA: Diagnosis not present

## 2020-11-10 DIAGNOSIS — A310 Pulmonary mycobacterial infection: Secondary | ICD-10-CM

## 2020-11-10 NOTE — Assessment & Plan Note (Signed)
She seems to have settled to a new baseline with some chronic shortness of breath and cough. Expectoration is minimal.  We again discussed airway clearance measures including hypertonic saline nebs, flutter valve and Mucinex and to stop.  Especially when cough gets more productive  We will repeat HRCT scan in 4 months for progression

## 2020-11-10 NOTE — Assessment & Plan Note (Signed)
This seems to have been related to bout of pneumonia rather than MAC.  Encouraging that she is gaining some weight back. Continue Ensure

## 2020-11-10 NOTE — Patient Instructions (Signed)
Schedule high-resolution CT scanning of the chest in June

## 2020-11-10 NOTE — Assessment & Plan Note (Signed)
Completed initial triple therapy and although I was concerned about disease progression last time, temporization of her symptoms and some weight gain is encouraging.  We will await follow-up HRCT and obtain more sputum's only if there is objective evidence of disease progression

## 2020-11-10 NOTE — Progress Notes (Signed)
Subjective:    Patient ID: SARANDA LEGRANDE, female    DOB: 1941-07-21, 80 y.o.   MRN: [id number]  HPI  79yoremote smokerFor FU of bronchiectasisdue to MAC She initially presented withhemoptysis Completed course of three times weekly rifampim, ethambutol and azithromycin4/2020 >> 04/2020  Chief Complaint  Patient presents with  . Follow-up    Cough slightly better    Left lower lobe pneumonia 06/2020 which took about 2 months to improve with weight loss about 10 pounds  Main issues are -Chronic cough which is sometimes embarrassing -Shortness of breath, progressive daily activities -Weight loss  She has gained 4 pounds since her last visit 2 months ago, is drinking Ensure.  Cough is slightly decreased although continues, minimal white phlegm, no hemoptysis. Shortness of breath continues  Significant tests/ events reviewed  CTA chest 06/2020 >> left lower lobe infiltrate with small loculated effusion HRCT 5/2021extensive bronchiectasis, mucoid impaction, tree-in-bud opacities and subpleural postinfectious scarring. No appreciable change  Allergy panel positive for various allergens including dogs, birch, oak, ragweed.  PFTs 03/2020-ratio 71, FEV1 87%, FVC 91%, DLCO 79%   PFTs 12/2017 ratio 68, FEV1 87%, FVC 92%, TLC nml, DLCO 68%  PFTs2016 FEV1 FVC 66 with FEV1 2.09 (102% of predicted), normal MVV normal lung volumes and normal DLCO 2016: Normal, unchanged except mild obstruction with FEV1 1.71/87%   2011. CT reveals bilateral tree-in-bud opacities more prominent in upper lobe and middle lobe Ct chest 05/14 - bil lower lobe opacities 08/2017 CT angio neg PE,Interval increase of diffuse nodular opacities with areas of bronchiectasis within the lung apices, the right middle lobe and the lingula.S/o MAI  07/2018 worsening bronchiectasis  Review of Systems neg for any significant sore throat, dysphagia, itching, sneezing, nasal congestion or excess/  purulent secretions, fever, chills, sweats, unintended wt loss, pleuritic or exertional cp, hempoptysis, orthopnea pnd or change in chronic leg swelling. Also denies presyncope, palpitations, heartburn, abdominal pain, nausea, vomiting, diarrhea or change in bowel or urinary habits, dysuria,hematuria, rash, arthralgias, visual complaints, headache, numbness weakness or ataxia.     Objective:   Physical Exam  Gen. Pleasant, thin, in no distress ENT - no thrush, no pallor/icterus,no post nasal drip Neck: No JVD, no thyromegaly, no carotid bruits Lungs: no use of accessory muscles, no dullness to percussion, bibasal dry rales no rhonchi  Cardiovascular: Rhythm regular, heart sounds  normal, no murmurs or gallops, no peripheral edema Musculoskeletal: No deformities, no cyanosis or clubbing        Assessment & Plan:

## 2020-12-10 DIAGNOSIS — Z23 Encounter for immunization: Secondary | ICD-10-CM | POA: Diagnosis not present

## 2020-12-29 DIAGNOSIS — H401123 Primary open-angle glaucoma, left eye, severe stage: Secondary | ICD-10-CM | POA: Diagnosis not present

## 2020-12-29 DIAGNOSIS — H401112 Primary open-angle glaucoma, right eye, moderate stage: Secondary | ICD-10-CM | POA: Diagnosis not present

## 2020-12-29 DIAGNOSIS — H2511 Age-related nuclear cataract, right eye: Secondary | ICD-10-CM | POA: Diagnosis not present

## 2021-01-24 NOTE — Telephone Encounter (Signed)
RA please advise. Thanks    Spoke to my mom this evening.  She has no fever, yellow-green sputum and slightly increased shortness of breath in addition to more cough.  She says  that augmentin has worked well before although I recall that was what she was on for pneumonia last October.  Sorry I didn't check back for messages earlier today. Thx so much, Barbaraann Share and Romie Minus

## 2021-01-27 MED ORDER — AMOXICILLIN-POT CLAVULANATE 875-125 MG PO TABS: 1.0000 | ORAL_TABLET | Freq: Two times a day (BID) | ORAL | 0 refills | Status: DC

## 2021-01-27 NOTE — Telephone Encounter (Signed)
Dr. Elsworth Soho please advise on patient mychart message

## 2021-01-27 NOTE — Telephone Encounter (Signed)
I am not convinced that we are dealing with an infectious bronchitis here. But since she is having surgery, reasonable to place safe and give her a course of Augmentin 875 twice daily for 7 days

## 2021-02-04 ENCOUNTER — Other Ambulatory Visit: Payer: Self-pay

## 2021-02-04 ENCOUNTER — Ambulatory Visit (HOSPITAL_COMMUNITY)
Admission: RE | Admit: 2021-02-04 | Discharge: 2021-02-04 | Disposition: A | Discharge status: Home / Self Care | Payer: Medicare Other | Source: Ambulatory Visit | Attending: Pulmonary Disease | Admitting: Pulmonary Disease

## 2021-02-04 DIAGNOSIS — J479 Bronchiectasis, uncomplicated: Secondary | ICD-10-CM | POA: Insufficient documentation

## 2021-02-04 DIAGNOSIS — Z853 Personal history of malignant neoplasm of breast: Secondary | ICD-10-CM | POA: Diagnosis not present

## 2021-02-04 DIAGNOSIS — M47814 Spondylosis without myelopathy or radiculopathy, thoracic region: Secondary | ICD-10-CM | POA: Diagnosis not present

## 2021-02-04 DIAGNOSIS — I2510 Atherosclerotic heart disease of native coronary artery without angina pectoris: Secondary | ICD-10-CM | POA: Diagnosis not present

## 2021-02-10 ENCOUNTER — Other Ambulatory Visit (HOSPITAL_COMMUNITY): Payer: Medicare Other

## 2021-02-10 DIAGNOSIS — H25811 Combined forms of age-related cataract, right eye: Secondary | ICD-10-CM | POA: Diagnosis not present

## 2021-02-10 DIAGNOSIS — Z9221 Personal history of antineoplastic chemotherapy: Secondary | ICD-10-CM | POA: Diagnosis not present

## 2021-02-10 DIAGNOSIS — R0789 Other chest pain: Secondary | ICD-10-CM | POA: Diagnosis not present

## 2021-02-10 DIAGNOSIS — Z853 Personal history of malignant neoplasm of breast: Secondary | ICD-10-CM | POA: Diagnosis not present

## 2021-02-10 DIAGNOSIS — H401112 Primary open-angle glaucoma, right eye, moderate stage: Secondary | ICD-10-CM | POA: Diagnosis not present

## 2021-02-10 DIAGNOSIS — Z923 Personal history of irradiation: Secondary | ICD-10-CM | POA: Diagnosis not present

## 2021-02-10 DIAGNOSIS — R059 Cough, unspecified: Secondary | ICD-10-CM | POA: Diagnosis not present

## 2021-02-10 DIAGNOSIS — Z7982 Long term (current) use of aspirin: Secondary | ICD-10-CM | POA: Diagnosis not present

## 2021-02-10 DIAGNOSIS — J479 Bronchiectasis, uncomplicated: Secondary | ICD-10-CM | POA: Diagnosis not present

## 2021-02-10 DIAGNOSIS — Z79899 Other long term (current) drug therapy: Secondary | ICD-10-CM | POA: Diagnosis not present

## 2021-02-10 NOTE — Progress Notes (Signed)
Called and left message on voicemail to please return phone call to go over CT results. Contact number provided.

## 2021-02-13 IMAGING — MG DIGITAL SCREENING BILAT W/ TOMO W/ CAD
8 series · 9 of 24 positions shown · non-contrast
Comparison: Previous exam(s).

CLINICAL DATA: Screening.

EXAM:
DIGITAL SCREENING BILATERAL MAMMOGRAM WITH TOMO AND CAD

[R MLO synth-2D]
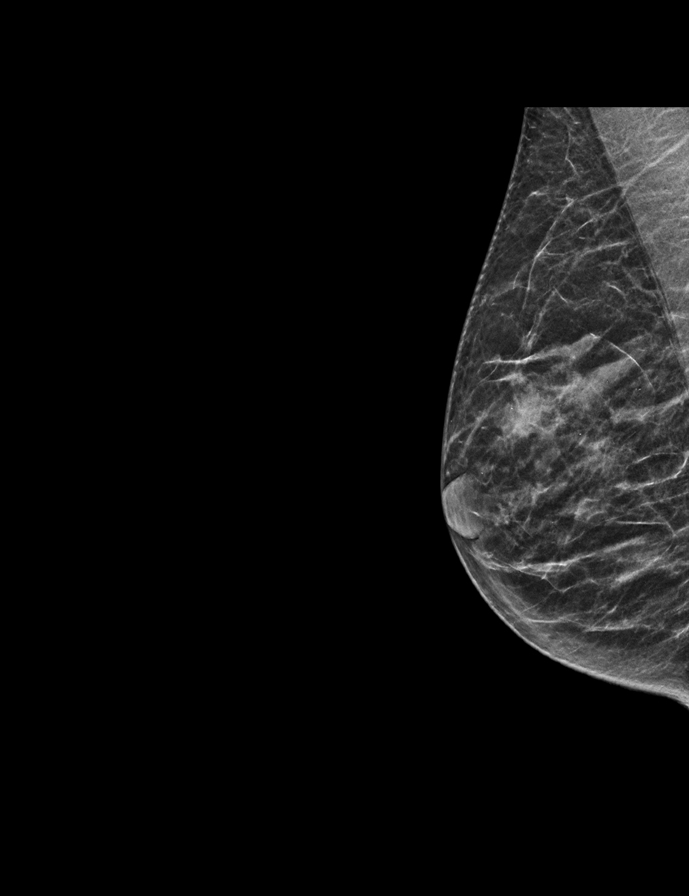

[R CC synth-2D]
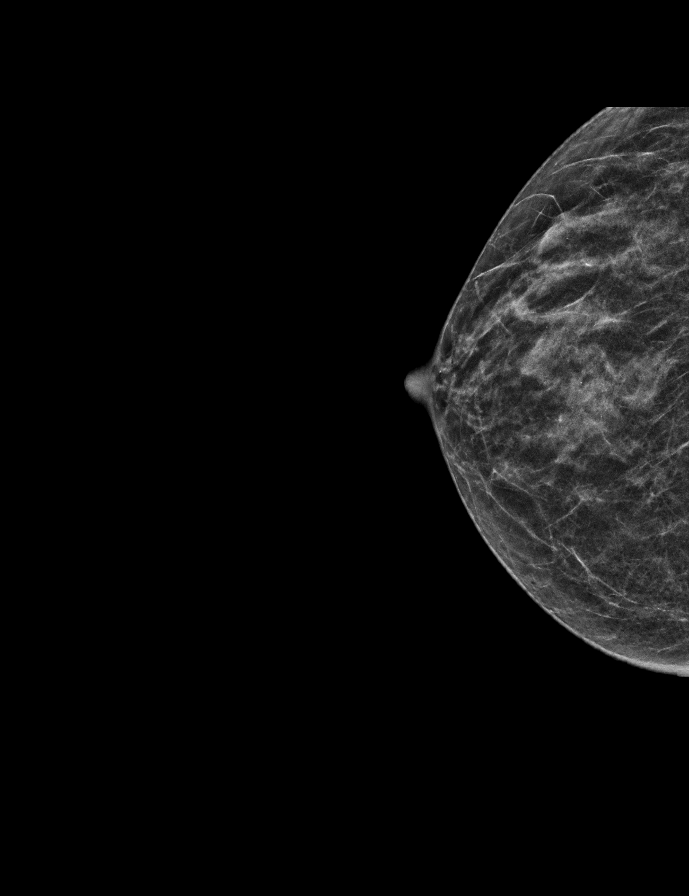

[L MLO synth-2D]
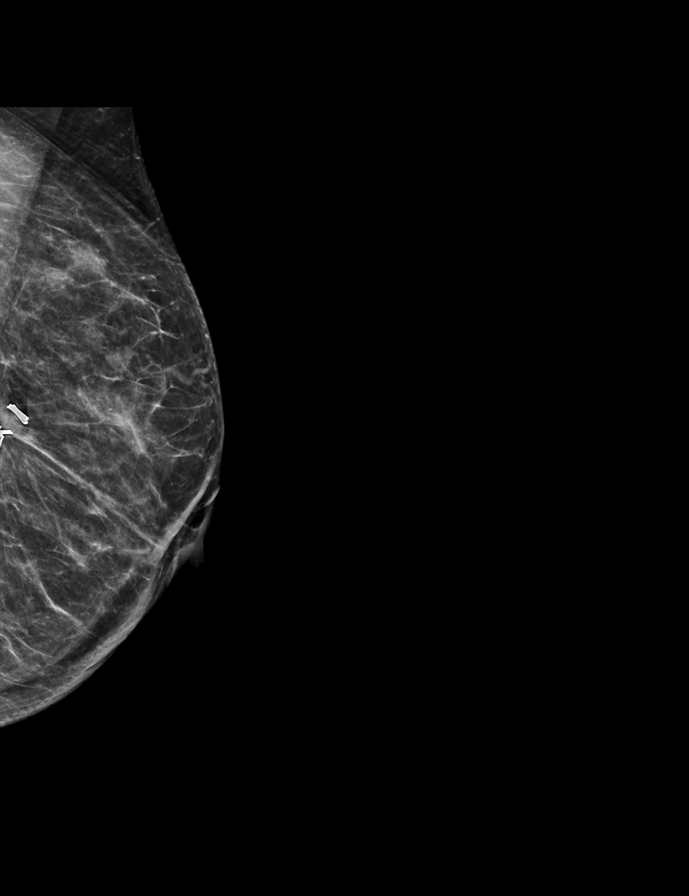

[L CC synth-2D]
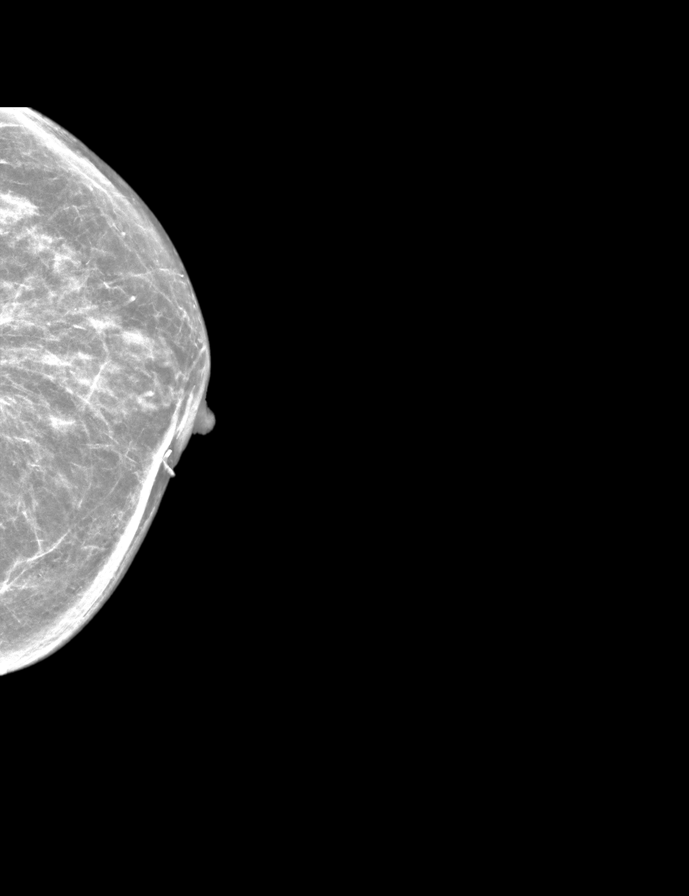

[L MLO tomo · 2 of 54 frames shown]
[frame 18/54]
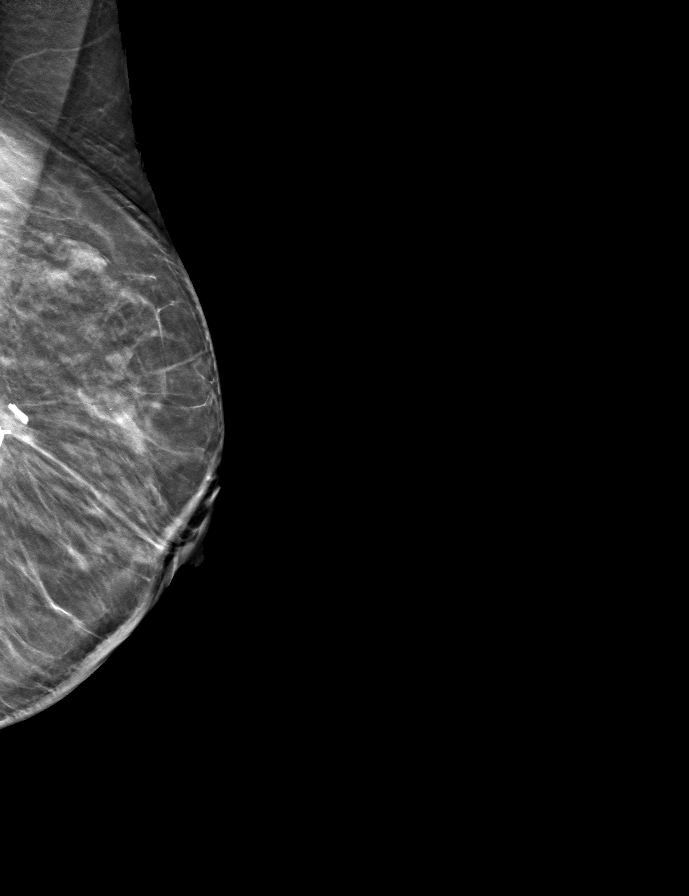
[frame 27/54]
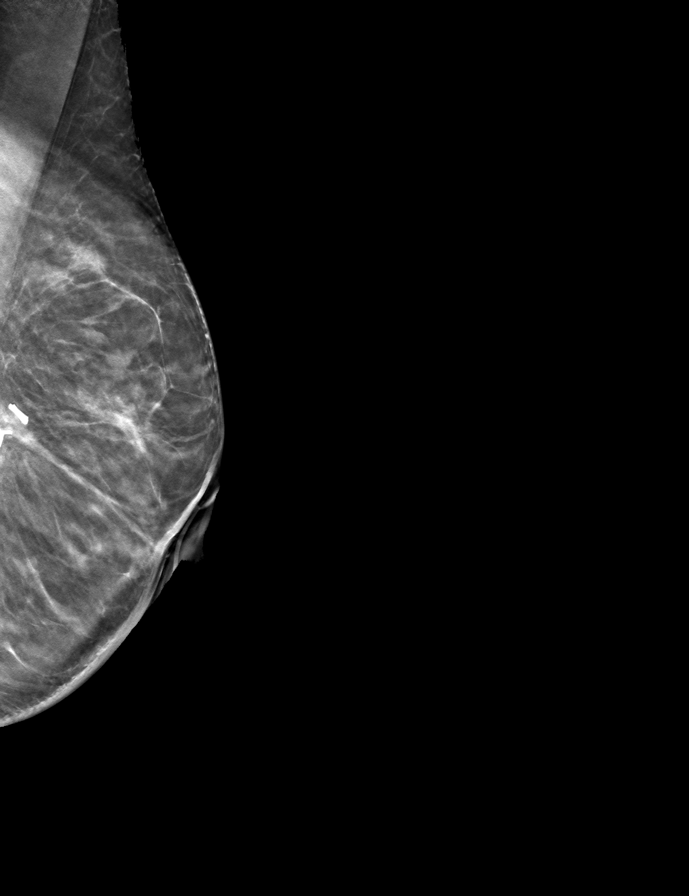

[R MLO tomo · tomo slice 21/42.0]
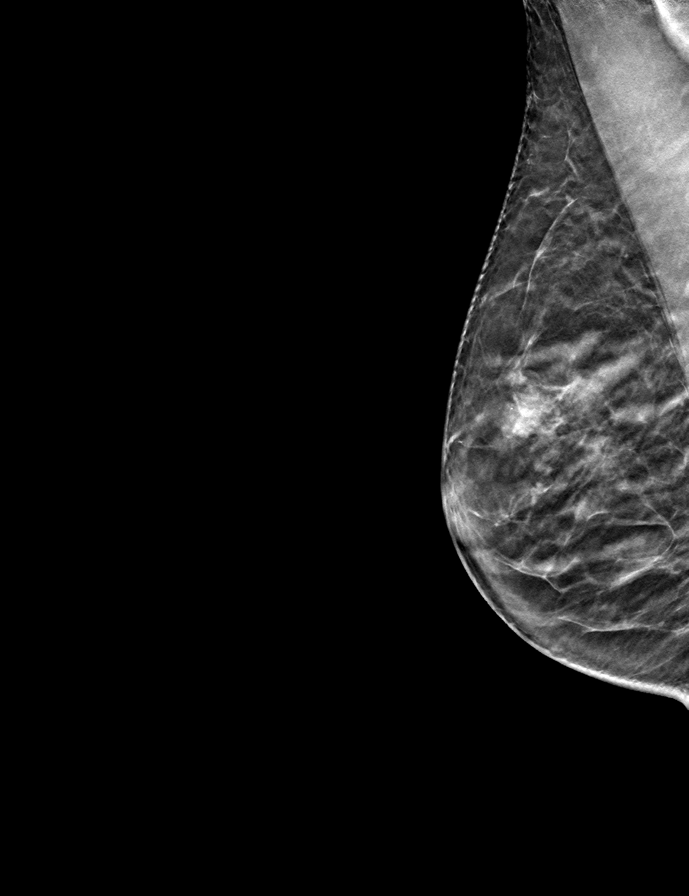

[R CC tomo · tomo slice 20/39.0]
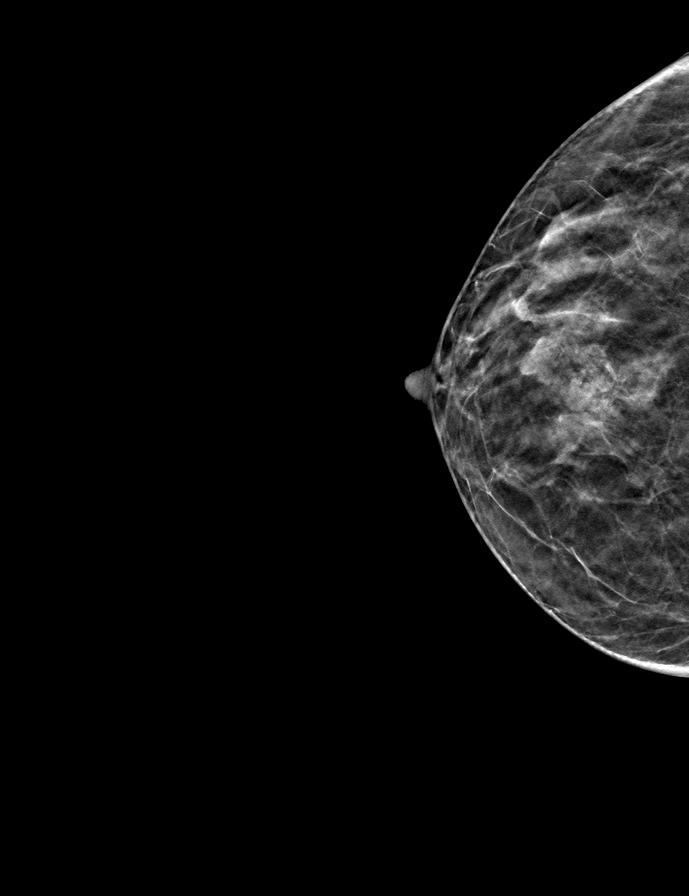

[L CC tomo · tomo slice 27/53.0]
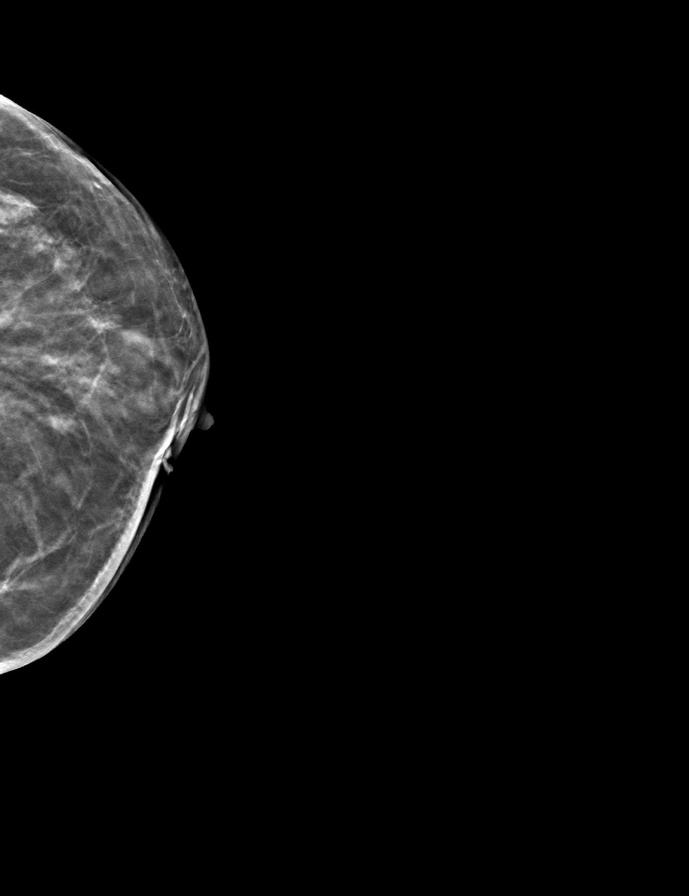

[9 of 24 positions shown; findings below may reference images not displayed]

ACR Breast Density Category c: The breast tissue is heterogeneously
dense, which may obscure small masses.
FINDINGS: There are no findings suspicious for malignancy. Images were
processed with CAD.
IMPRESSION: No mammographic evidence of malignancy. A result letter of this
screening mammogram will be mailed directly to the patient.

RECOMMENDATION:
Screening mammogram in one year. (Code:[id number])

BI-RADS CATEGORY  1: Negative.

## 2021-03-25 ENCOUNTER — Ambulatory Visit (INDEPENDENT_AMBULATORY_CARE_PROVIDER_SITE_OTHER): Payer: Medicare Other | Admitting: Pulmonary Disease

## 2021-03-25 ENCOUNTER — Other Ambulatory Visit: Payer: Self-pay

## 2021-03-25 ENCOUNTER — Encounter: Payer: Self-pay | Admitting: Pulmonary Disease

## 2021-03-25 VITALS — BP 118/74 | HR 76 | Temp 98.0°F | Ht 63.0 in | Wt 107.4 lb

## 2021-03-25 DIAGNOSIS — J479 Bronchiectasis, uncomplicated: Secondary | ICD-10-CM

## 2021-03-25 DIAGNOSIS — A310 Pulmonary mycobacterial infection: Secondary | ICD-10-CM | POA: Diagnosis not present

## 2021-03-25 NOTE — Assessment & Plan Note (Signed)
I once again emphasized airway clearance is the mainstay for treatment I asked her to use hypertonic saline nebs. We will provide her with a smart vest  This patient has severe bronchiectasis causing daily productive cough for at least 6 months. SHe has frequent exacerbations (more than 2/year) requiring antibiotic therapy. Presence of bronchiectasis has been confirmed by CT chest. She has tried and failed the following airway clearance therapies : flutter valve, hypertonic saline, manual CPT Reasons above therapy failed include : no caregiver available, unable to tolerate positioning/percussion, did not mobilise secretions

## 2021-03-25 NOTE — Progress Notes (Signed)
   Subjective:    Patient ID: [name] Rice, female    DOB: Dec 21, 1940, 80 y.o.   MRN: [id number]  HPI  80 yo remote smoker  For FU of bronchiectasis due to [place]  She initially presented with  hemoptysis Completed course of three times weekly rifampim, ethambutol and azithromycin 12/2018 >> 04/2020  LLL pneumonia 10/ 2021    Chief Complaint  Patient presents with   Follow-up    Patient reports she is cough is yellow at times and can be a large amount or a small amount.    She arrives accompanied by her husband today. Weight has dropped from 111 to 107 pounds. She is not using saline nebs, she is compliant with albuterol MDI and Mucinex.  She takes Delsym at bedtime and once in the daytime. She still has significant sputum production, sputum production has been ongoing for couple of years now We reviewed CT chest from 01/2021   Significant tests/ events reviewed   HRCT chest 01/2021  progressed in BUL since 01/2020 but stable in lower lobes, stable nodule 1.6 cm LLL  CTA chest 06/2020 >> left lower lobe infiltrate with small loculated effusion HRCT 01/2020 extensive bronchiectasis, mucoid impaction, tree-in-bud opacities and subpleural postinfectious scarring. No appreciable change   2011. CT reveals bilateral tree-in-bud opacities more prominent in upper lobe and middle lobe Ct chest 05/14 - bil lower lobe opacities  08/2017 CT angio neg PE, Interval increase of diffuse nodular opacities with areas of bronchiectasis within the lung apices, the right middle lobe and the lingula. S/o MAI   Allergy panel positive for various allergens including dogs, birch, oak, ragweed.   PFTs 03/2020-ratio 71, FEV1 87%, FVC 91%, DLCO 79%    PFTs 12/2017 ratio 68, FEV1 87%, FVC 92%, TLC nml, DLCO 68%   PFTs 2016 FEV1 FVC 66 with FEV1 2.09 (102% of predicted), normal MVV normal lung volumes and normal DLCO 2016: Normal, unchanged except mild obstruction with FEV1 1.71/87%    Past Medical  History:  Diagnosis Date   Bronchiectasis ([place])    Cancer ([place])    remission- breast   Glaucoma         Review of Systems neg for any significant sore throat, dysphagia, itching, sneezing, nasal congestion or excess/ purulent secretions, fever, chills, sweats, unintended wt loss, pleuritic or exertional cp, hempoptysis, orthopnea pnd or change in chronic leg swelling. Also denies presyncope, palpitations, heartburn, abdominal pain, nausea, vomiting, diarrhea or change in bowel or urinary habits, dysuria,hematuria, rash, arthralgias, visual complaints, headache, numbness weakness or ataxia.     Objective:   Physical Exam  Gen. Pleasant, thin,frail,, in no distress ENT - no thrush, no pallor/icterus,no post nasal drip Neck: No JVD, no thyromegaly, no carotid bruits Lungs: no use of accessory muscles, no dullness to percussion, BL scattered dry rales Cardiovascular: Rhythm regular, heart sounds  normal, no murmurs or gallops, no peripheral edema Musculoskeletal: No deformities, no cyanosis or clubbing        Assessment & Plan:

## 2021-03-25 NOTE — Patient Instructions (Signed)
Sputum for afb Rx for smart vest Try to be more diligent about using saline nebs daily

## 2021-03-25 NOTE — Assessment & Plan Note (Signed)
Given disease progression on CT, we will recheck sputum AFB If again positive, may have to consider treating again for MAC

## 2021-03-29 ENCOUNTER — Other Ambulatory Visit: Payer: Medicare Other

## 2021-03-29 DIAGNOSIS — J479 Bronchiectasis, uncomplicated: Secondary | ICD-10-CM

## 2021-05-06 ENCOUNTER — Telehealth: Payer: Self-pay | Admitting: Pulmonary Disease

## 2021-05-06 MED ORDER — SODIUM CHLORIDE 3 % IN NEBU: INHALATION_SOLUTION | Freq: Every day | RESPIRATORY_TRACT | 5 refills | Status: DC

## 2021-05-06 NOTE — Telephone Encounter (Signed)
Call made to patient, confirmed DOB. Confirmed pharmacy and medication. Refill sent.   Nothing further needed at this time.

## 2021-05-13 LAB — AFB CULTURE WITH SMEAR (NOT AT ARMC)
Acid Fast Culture: NEGATIVE
Acid Fast Smear: NEGATIVE

## 2021-05-18 IMAGING — CR DG CHEST 2V
2 series · 2 of 2 positions shown · non-contrast
Comparison: Chest CTA 06/29/2020 and earlier.

CLINICAL DATA: 79-year-old female with shortness of breath.
Community acquired versus atypical mycobacteria pneumonia.

EXAM:
CHEST - 2 VIEW

[w chest pa]
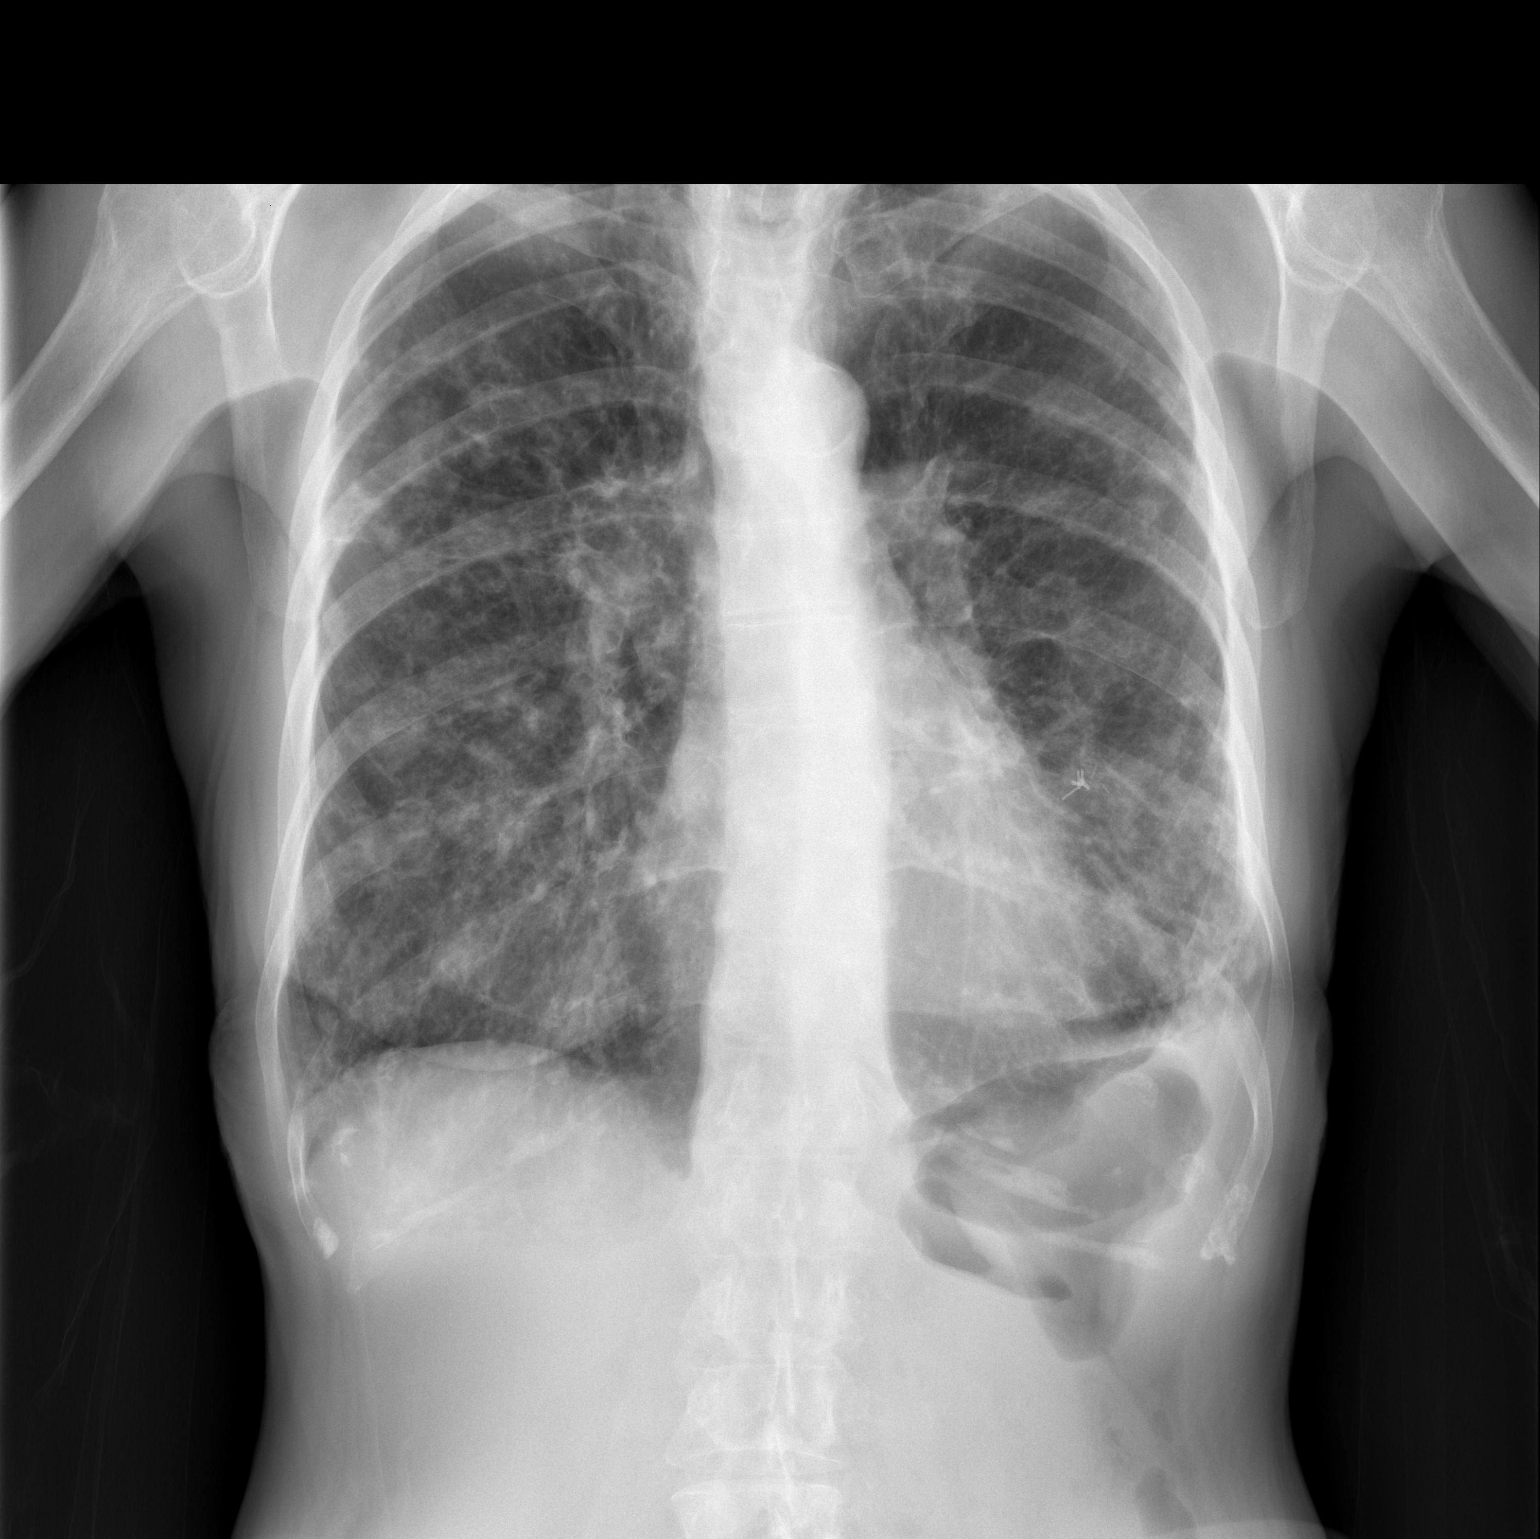

[w chest lat]
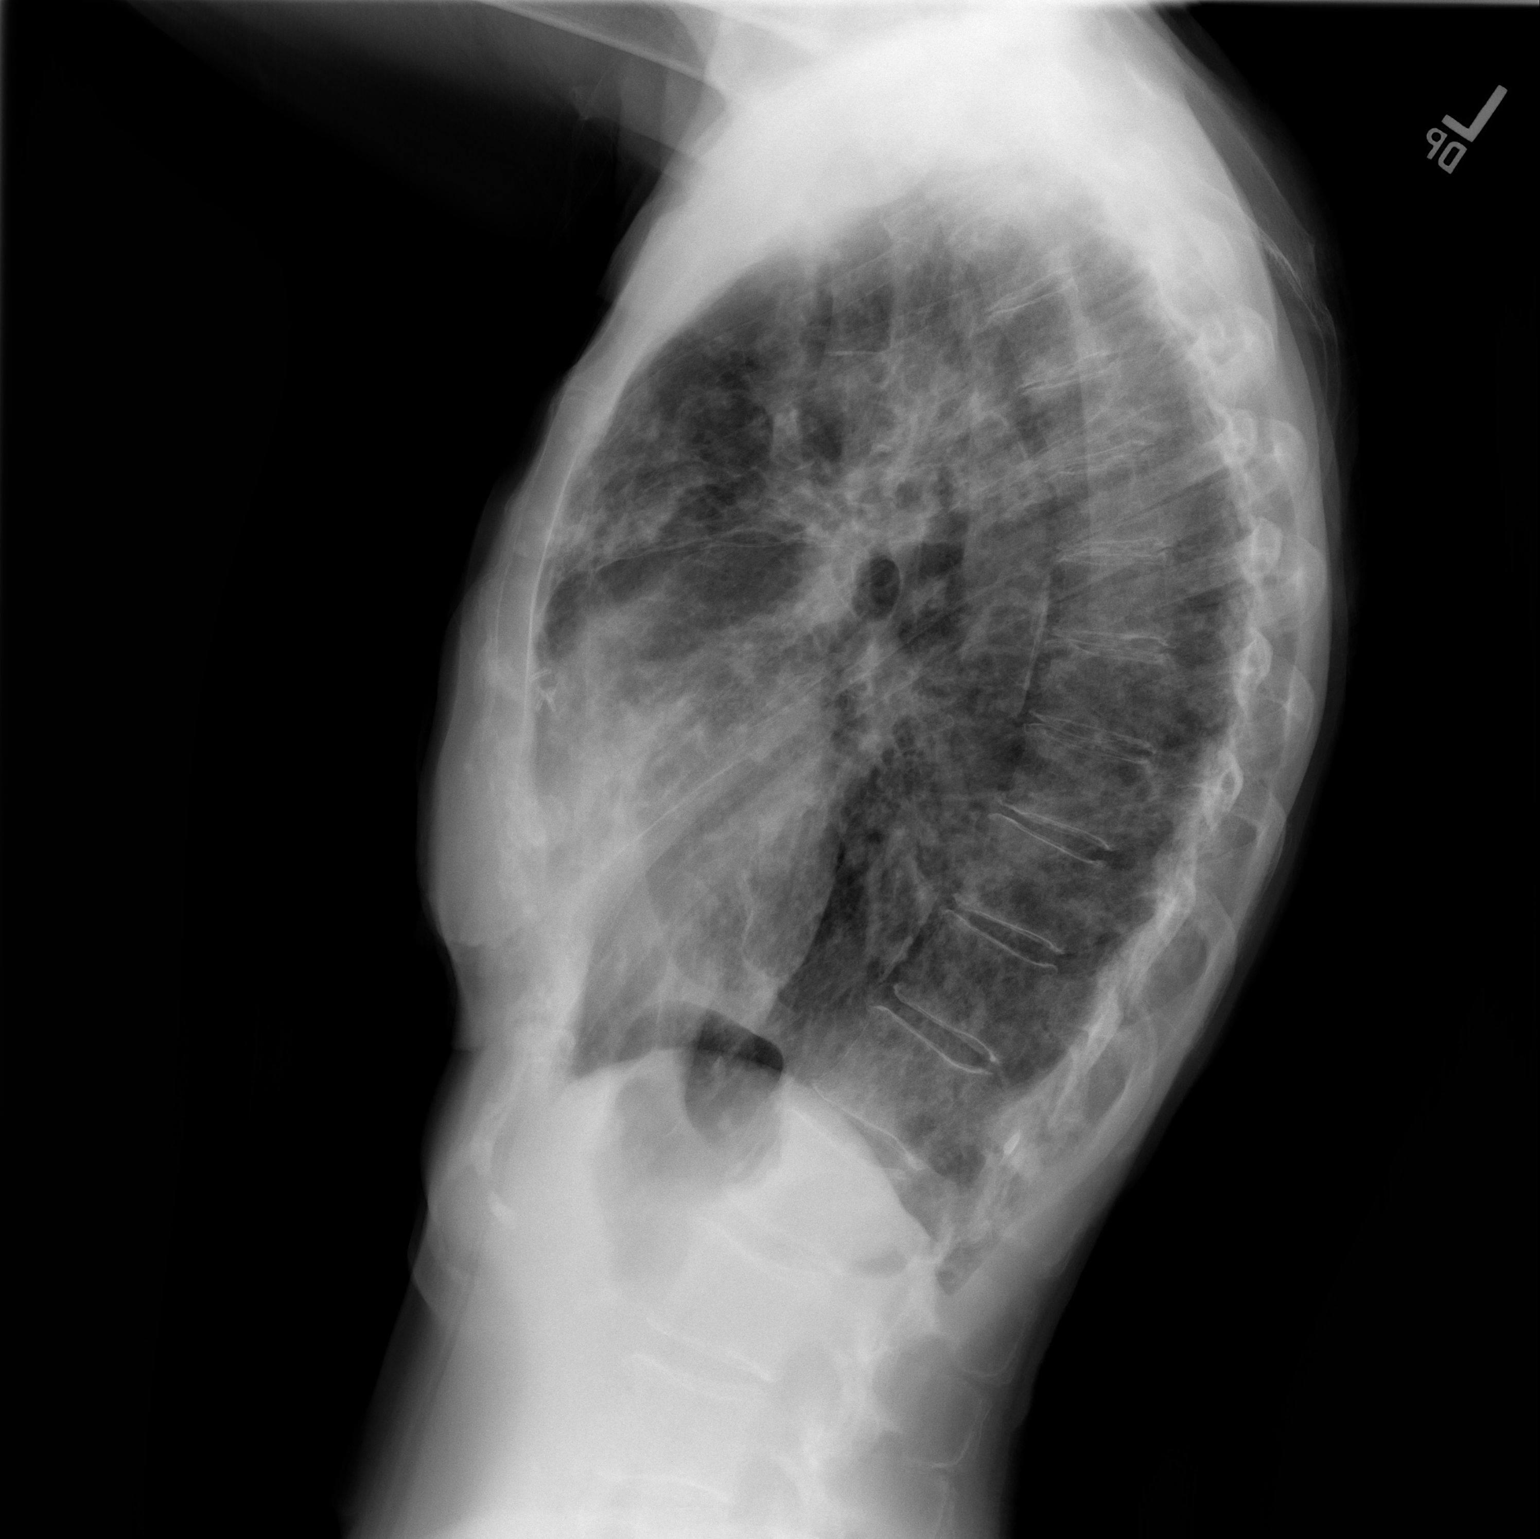

[2 of 2 positions shown; findings below may reference images not displayed]

FINDINGS: Chronic large lung volumes and coarse bilateral reticulonodular
pulmonary opacity, but progressed from prior radiographs, including
mildly progressed from radiographs last month. Loculated pleural
effusion at the left lung base has regressed. No pneumothorax or
pulmonary edema. No consolidation.

Stable surgical clips in the left breast. Stable visualized osseous
structures. Negative visible bowel gas pattern.
IMPRESSION: 1. Hyperinflation with coarse reticulonodular opacity in both lungs,
chronic but progressed since last month, compatible with worsening
respiratory infection. Favor atypical etiologies, including BATIZ.

2. Loculated left pleural effusion has regressed.

## 2021-06-14 DIAGNOSIS — Z23 Encounter for immunization: Secondary | ICD-10-CM | POA: Diagnosis not present

## 2021-07-28 ENCOUNTER — Encounter: Payer: Self-pay | Admitting: Pulmonary Disease

## 2021-07-28 ENCOUNTER — Ambulatory Visit (INDEPENDENT_AMBULATORY_CARE_PROVIDER_SITE_OTHER): Payer: Medicare Other | Admitting: Pulmonary Disease

## 2021-07-28 ENCOUNTER — Other Ambulatory Visit: Payer: Self-pay

## 2021-07-28 DIAGNOSIS — J479 Bronchiectasis, uncomplicated: Secondary | ICD-10-CM | POA: Diagnosis not present

## 2021-07-28 DIAGNOSIS — A310 Pulmonary mycobacterial infection: Secondary | ICD-10-CM | POA: Diagnosis not present

## 2021-07-28 MED ORDER — SODIUM CHLORIDE 3 % IN NEBU: INHALATION_SOLUTION | Freq: Every day | RESPIRATORY_TRACT | 5 refills | Status: DC

## 2021-07-28 NOTE — Assessment & Plan Note (Signed)
Appears stable by history.   we will arrange for 73-monthfollow-up CT chest in April 2023 Doubt that she would tolerate repeat treatment

## 2021-07-28 NOTE — Assessment & Plan Note (Signed)
Airway clearance measures once again clarified. She will use a sequence of hypertonic saline nebs followed by chest vest and then flutter valve to make her cough and hopefully she can do this regimen at least once daily if not twice Offered her pulmonary rehab program but she would like to hold off since she is in transition to independent living

## 2021-07-28 NOTE — Patient Instructions (Addendum)
  Sequence of Rx should be neb >> vest >> flutter valve & cough HRCT chest in April 2023

## 2021-07-28 NOTE — Progress Notes (Signed)
   Subjective:    Patient ID: AGRIPINA GUYETTE, female    DOB: [birth year], 80 y.o.   MRN: [id number]  HPI  80 yo remote smoker  For FU of bronchiectasis due to [facility]  Daughter [name] MD She initially presented with  hemoptysis Completed course of three times weekly rifampim, ethambutol and azithromycin 12/2018 >> 04/2020   LLL pneumonia 10/ 2021  Last OV 03/2021 Rx for smartvest Repeat sp afb was neg  82-monthfollow-up, husband GIona Beardis getting worse with Parkinson's.  They are getting ready to move into independent living at friend's home.  She reports occasional left lower lung pain especially on deep breaths but no sputum production or fevers. She is compliant with using her vest twice daily, uses saline nebs once and needs refill. Overall breathing is stable. Weight has been steady at 106 pounds  Significant tests/ events reviewed  HRCT chest 01/2021  progressed in BUL since 01/2020 but stable in lower lobes, stable nodule 1.6 cm LLL   CTA chest 06/2020 >> left lower lobe infiltrate with small loculated effusion HRCT 01/2020 extensive bronchiectasis, mucoid impaction, tree-in-bud opacities and subpleural postinfectious scarring. No appreciable change    2011. CT reveals bilateral tree-in-bud opacities more prominent in upper lobe and middle lobe Ct chest 05/14 - bil lower lobe opacities  08/2017 CT angio neg PE, Interval increase of diffuse nodular opacities with areas of bronchiectasis within the lung apices, the right middle lobe and the lingula. S/o MAI   Allergy panel positive for various allergens including dogs, birch, oak, ragweed.   PFTs 03/2020-ratio 71, FEV1 87%, FVC 91%, DLCO 79%    PFTs 12/2017 ratio 68, FEV1 87%, FVC 92%, TLC nml, DLCO 68%   PFTs 2016 FEV1 FVC 66 with FEV1 2.09 (102% of predicted), normal MVV normal lung volumes and normal DLCO 2016: Normal, unchanged except mild obstruction with FEV1 1.71/87%  Review of Systems neg for any significant sore throat,  dysphagia, itching, sneezing, nasal congestion or excess/ purulent secretions, fever, chills, sweats, unintended wt loss, pleuritic or exertional cp, hempoptysis, orthopnea pnd or change in chronic leg swelling. Also denies presyncope, palpitations, heartburn, abdominal pain, nausea, vomiting, diarrhea or change in bowel or urinary habits, dysuria,hematuria, rash, arthralgias, visual complaints, headache, numbness weakness or ataxia.     Objective:   Physical Exam  Gen. Pleasant, thin frail , in no distress ENT - no thrush, no pallor/icterus,no post nasal drip Neck: No JVD, no thyromegaly, no carotid bruits Lungs: no use of accessory muscles, no dullness to percussion, decreased BS BL without rales or rhonchi  Cardiovascular: Rhythm regular, heart sounds  normal, no murmurs or gallops, no peripheral edema Musculoskeletal: No deformities, no cyanosis or clubbing        Assessment & Plan:

## 2021-09-01 DIAGNOSIS — H524 Presbyopia: Secondary | ICD-10-CM | POA: Diagnosis not present

## 2021-09-01 DIAGNOSIS — H04123 Dry eye syndrome of bilateral lacrimal glands: Secondary | ICD-10-CM | POA: Diagnosis not present

## 2021-09-01 DIAGNOSIS — H401134 Primary open-angle glaucoma, bilateral, indeterminate stage: Secondary | ICD-10-CM | POA: Diagnosis not present

## 2021-09-01 DIAGNOSIS — H43813 Vitreous degeneration, bilateral: Secondary | ICD-10-CM | POA: Diagnosis not present

## 2021-09-03 DIAGNOSIS — H401112 Primary open-angle glaucoma, right eye, moderate stage: Secondary | ICD-10-CM | POA: Diagnosis not present

## 2021-09-03 DIAGNOSIS — H401123 Primary open-angle glaucoma, left eye, severe stage: Secondary | ICD-10-CM | POA: Diagnosis not present

## 2021-09-15 DIAGNOSIS — Z87891 Personal history of nicotine dependence: Secondary | ICD-10-CM | POA: Diagnosis not present

## 2021-09-15 DIAGNOSIS — T85398A Other mechanical complication of other ocular prosthetic devices, implants and grafts, initial encounter: Secondary | ICD-10-CM | POA: Diagnosis not present

## 2021-09-15 DIAGNOSIS — Z853 Personal history of malignant neoplasm of breast: Secondary | ICD-10-CM | POA: Diagnosis not present

## 2021-09-15 DIAGNOSIS — T85328A Displacement of other ocular prosthetic devices, implants and grafts, initial encounter: Secondary | ICD-10-CM | POA: Diagnosis not present

## 2021-11-02 ENCOUNTER — Other Ambulatory Visit: Payer: Self-pay | Admitting: Internal Medicine

## 2021-11-02 ENCOUNTER — Other Ambulatory Visit (HOSPITAL_COMMUNITY): Payer: Self-pay | Admitting: Internal Medicine

## 2021-11-02 ENCOUNTER — Ambulatory Visit
Admission: RE | Admit: 2021-11-02 | Discharge: 2021-11-02 | Disposition: A | Discharge status: Home / Self Care | Payer: Medicare Other | Source: Ambulatory Visit | Attending: Internal Medicine | Admitting: Internal Medicine

## 2021-11-02 DIAGNOSIS — J4520 Mild intermittent asthma, uncomplicated: Secondary | ICD-10-CM | POA: Diagnosis not present

## 2021-11-02 DIAGNOSIS — E440 Moderate protein-calorie malnutrition: Secondary | ICD-10-CM | POA: Diagnosis not present

## 2021-11-02 DIAGNOSIS — I480 Paroxysmal atrial fibrillation: Secondary | ICD-10-CM

## 2021-11-02 DIAGNOSIS — J479 Bronchiectasis, uncomplicated: Secondary | ICD-10-CM | POA: Diagnosis not present

## 2021-11-02 DIAGNOSIS — Z0000 Encounter for general adult medical examination without abnormal findings: Secondary | ICD-10-CM | POA: Diagnosis not present

## 2021-11-02 DIAGNOSIS — I499 Cardiac arrhythmia, unspecified: Secondary | ICD-10-CM | POA: Diagnosis not present

## 2021-11-02 DIAGNOSIS — Z1389 Encounter for screening for other disorder: Secondary | ICD-10-CM | POA: Diagnosis not present

## 2021-11-02 DIAGNOSIS — R0609 Other forms of dyspnea: Secondary | ICD-10-CM | POA: Diagnosis not present

## 2021-11-02 DIAGNOSIS — Z853 Personal history of malignant neoplasm of breast: Secondary | ICD-10-CM | POA: Diagnosis not present

## 2021-11-02 DIAGNOSIS — Z23 Encounter for immunization: Secondary | ICD-10-CM | POA: Diagnosis not present

## 2021-11-04 DIAGNOSIS — I480 Paroxysmal atrial fibrillation: Secondary | ICD-10-CM | POA: Diagnosis not present

## 2021-11-10 ENCOUNTER — Ambulatory Visit (HOSPITAL_COMMUNITY): Payer: Medicare Other | Attending: Cardiology

## 2021-11-10 ENCOUNTER — Other Ambulatory Visit: Payer: Self-pay

## 2021-11-10 DIAGNOSIS — I480 Paroxysmal atrial fibrillation: Secondary | ICD-10-CM | POA: Insufficient documentation

## 2021-11-10 LAB — ECHOCARDIOGRAM COMPLETE
MV M vel: 4.59 m/s
MV Peak grad: 84.4 mmHg
Radius: 0.3 cm
S' Lateral: 2.5 cm

## 2021-11-24 ENCOUNTER — Ambulatory Visit (HOSPITAL_COMMUNITY)
Admission: RE | Admit: 2021-11-24 | Discharge: 2021-11-24 | Disposition: A | Discharge status: Home / Self Care | Payer: Medicare Other | Source: Ambulatory Visit | Attending: Nurse Practitioner | Admitting: Nurse Practitioner

## 2021-11-24 ENCOUNTER — Other Ambulatory Visit: Payer: Self-pay

## 2021-11-24 VITALS — BP 130/54 | HR 78 | Ht 62.99 in | Wt 111.0 lb

## 2021-11-24 DIAGNOSIS — Z853 Personal history of malignant neoplasm of breast: Secondary | ICD-10-CM | POA: Insufficient documentation

## 2021-11-24 DIAGNOSIS — I4892 Unspecified atrial flutter: Secondary | ICD-10-CM | POA: Diagnosis not present

## 2021-11-24 DIAGNOSIS — J479 Bronchiectasis, uncomplicated: Secondary | ICD-10-CM | POA: Diagnosis not present

## 2021-11-24 DIAGNOSIS — Z7901 Long term (current) use of anticoagulants: Secondary | ICD-10-CM | POA: Insufficient documentation

## 2021-11-24 DIAGNOSIS — I081 Rheumatic disorders of both mitral and tricuspid valves: Secondary | ICD-10-CM | POA: Diagnosis not present

## 2021-11-24 DIAGNOSIS — D6869 Other thrombophilia: Secondary | ICD-10-CM

## 2021-11-24 DIAGNOSIS — I4891 Unspecified atrial fibrillation: Secondary | ICD-10-CM | POA: Insufficient documentation

## 2021-11-24 LAB — CBC
HCT: 37.6 % (ref 36.0–46.0)
Hemoglobin: 11.9 g/dL — ABNORMAL LOW (ref 12.0–15.0)
MCH: 29.7 pg (ref 26.0–34.0)
MCHC: 31.6 g/dL (ref 30.0–36.0)
MCV: 93.8 fL (ref 80.0–100.0)
Platelets: 279 10*3/uL (ref 150–400)
RBC: 4.01 MIL/uL (ref 3.87–5.11)
RDW: 13.8 % (ref 11.5–15.5)
WBC: 8.3 10*3/uL (ref 4.0–10.5)
nRBC: 0 % (ref 0.0–0.2)

## 2021-11-24 LAB — BASIC METABOLIC PANEL
Anion gap: 10 (ref 5–15)
BUN: 7 mg/dL — ABNORMAL LOW (ref 8–23)
CO2: 25 mmol/L (ref 22–32)
Calcium: 8.8 mg/dL — ABNORMAL LOW (ref 8.9–10.3)
Chloride: 98 mmol/L (ref 98–111)
Creatinine, Ser: 0.55 mg/dL (ref 0.44–1.00)
GFR, Estimated: >60 mL/min (ref >60)
Glucose, Bld: 105 mg/dL — ABNORMAL HIGH (ref 70–99)
Potassium: 5 mmol/L (ref 3.5–5.1)
Sodium: 133 mmol/L — ABNORMAL LOW (ref 135–145)

## 2021-11-24 NOTE — Progress Notes (Signed)
? ?Primary Care Physician: Lavone Orn, MD ?Referring Physician:Dr. Oval Linsey ? ? ?[name] Rice is a 81 y.o. female with a h/o bronchiectasis, remote breast CA of L breast, tx with radiation and chemo, recent dx new onset afib at [facility] office in February.  Started on eliquis 2.5 mg bid 11/04/21 and diltiazem 180 mg daily for rate control. Echo ordered and also showed afib at time of echo. She is now in the afib clinic for further evaluation/treatment of afib. She is her with her daughter who is a Contractor. She remains in afib, rate controlled.  ? ?Triggers discussed. For the most part drinks nightly glass of wine. Snores but no apnea witnessed. Drinks decaf products. No tobacco.  ? ?Today, she denies symptoms of palpitations, chest pain, shortness of breath, orthopnea, PND, lower extremity edema, dizziness, presyncope, syncope, or neurologic sequela. Chronic cough.  The patient is tolerating medications without difficulties and is otherwise without complaint today.  ? ?Past Medical History:  ?Diagnosis Date  ? Bronchiectasis ([place])   ? Cancer [facility])   ? remission- breast  ? Glaucoma   ? ?Past Surgical History:  ?Procedure Laterality Date  ? BLADDER REPAIR    ? BREAST LUMPECTOMY Left 2009  ? ? ?Current Outpatient Medications  ?Medication Sig Dispense Refill  ? acetaminophen (TYLENOL) 325 MG tablet Take 325 mg by mouth at bedtime.     ? albuterol (VENTOLIN HFA) 108 (90 Base) MCG/ACT inhaler INHALE 2 PUFFS INTO THE LUNGS EVERY 6 HOURS AS NEEDED FOR WHEEZING/SHORNTESS OF BREATH (Patient taking differently: Inhale 2 puffs into the lungs every 6 (six) hours as needed for wheezing or shortness of breath.) 6.7 g 1  ? AZOPT 1 % ophthalmic suspension Place 1 drop into both eyes in the morning and at bedtime.   11  ? benzonatate (TESSALON) 200 MG capsule Take 1 capsule (200 mg total) by mouth 3 (three) times daily as needed for cough. (Patient not taking: Reported on 07/28/2021) 90 capsule 1  ? Cholecalciferol 50  MCG (2000 UT) CAPS Take by mouth. (Patient not taking: Reported on 07/28/2021)    ? Dextromethorphan HBr (DELSYM PO) Take 10 mg by mouth 2 (two) times daily.    ? guaiFENesin (MUCINEX) 600 MG 12 hr tablet Take 1 tablet (600 mg total) by mouth 2 (two) times daily. 30 tablet 2  ? ibuprofen (ADVIL) 100 MG tablet Take 100 mg by mouth every 8 (eight) hours as needed for fever.    ? latanoprost (XALATAN) 0.005 % ophthalmic solution Place 1 drop into both eyes at bedtime.     ? montelukast (SINGULAIR) 10 MG tablet TAKE 1 TABLET BY MOUTH EVERYDAY AT BEDTIME 90 tablet 1  ? sodium chloride HYPERTONIC 3 % nebulizer solution Take by nebulization daily. 750 mL 5  ? ?No current facility-administered medications for this encounter.  ? ? ?Allergies  ?Allergen Reactions  ? Apraclonidine Other (See Comments)  ?  Redness of eye  ? Iodine Other (See Comments)  ?  Iodine in eye drops - redness of eyes  ? ? ?Social History  ? ?Socioeconomic History  ? Marital status: Married  ?  Spouse name: Not on file  ? Number of children: Not on file  ? Years of education: Not on file  ? Highest education level: Not on file  ?Occupational History  ? Not on file  ?Tobacco Use  ? Smoking status: Former  ?  Packs/day: 0.25  ?  Years: 3.00  ?  Pack years: 0.75  ?  Types: Cigarettes  ?  Quit date: 09/12/1961  ?  Years since quitting: 60.2  ? Smokeless tobacco: Never  ?Vaping Use  ? Vaping Use: Never used  ?Substance and Sexual Activity  ? Alcohol use: Yes  ? Drug use: No  ? Sexual activity: Not on file  ?Other Topics Concern  ? Not on file  ?Social History Narrative  ? Not on file  ? ?Social Determinants of Health  ? ?Financial Resource Strain: Not on file  ?Food Insecurity: Not on file  ?Transportation Needs: Not on file  ?Physical Activity: Not on file  ?Stress: Not on file  ?Social Connections: Not on file  ?Intimate Partner Violence: Not on file  ? ? ?No family history on file. ? ?ROS- All systems are reviewed and negative except as per the HPI  above ? ?Physical Exam: ?Vitals:  ? 11/24/21 1404  ?Height: 5' 2.99" (1.6 m)  ? ?Wt Readings from Last 3 Encounters:  ?07/28/21 48.2 kg  ?03/25/21 48.7 kg  ?11/10/20 50.5 kg  ? ? ?Labs: ?Lab Results  ?Component Value Date  ? NA 139 06/28/2020  ? K 3.7 06/28/2020  ? CL 103 06/28/2020  ? CO2 24 06/28/2020  ? GLUCOSE 104 (H) 06/28/2020  ? BUN 8 06/28/2020  ? CREATININE 0.56 06/28/2020  ? CALCIUM 8.6 (L) 06/28/2020  ? ?No results found for: INR ?Lab Results  ?Component Value Date  ? CHOL 161 07/24/2007  ? HDL 54 07/24/2007  ? [place] 98 07/24/2007  ? TRIG 43 07/24/2007  ? ? ? ?GEN- The patient is well appearing, alert and oriented x 3 today.   ?Head- normocephalic, atraumatic ?Eyes-  Sclera clear, conjunctiva pink ?Ears- hearing intact ?Oropharynx- clear ?Neck- supple, no JVP ?Lymph- no cervical lymphadenopathy ?Lungs- Clear to ausculation bilaterally, normal work of breathing ?Heart- irregular rate and rhythm, no murmurs, rubs or gallops, PMI not laterally displaced ?GI- soft, NT, ND, + BS ?Extremities- no clubbing, cyanosis, or edema ?MS- no significant deformity or atrophy ?Skin- no rash or lesion ?Psych- euthymic mood, full affect ?Neuro- strength and sensation are intact ? ?EKG- atrial flutter with AV block, qrs int 70 ms, qtc 435 ms   ? ?Echo-r Comments:  ?Technically difficult study. ?IMPRESSIONS ?Left ventricular ejection fraction, by estimation, is 60 to 65%. The left ventricle has ?normal function. The left ventricle has no regional wall motion abnormalities. There is ?mild concentric left ventricular hypertrophy. Left ventricular diastolic function could ?not be evaluated. ?1. ?Right ventricular systolic function is normal. The right ventricular size is normal. ?There is mildly elevated pulmonary artery systolic pressure. The estimated right ?ventricular systolic pressure is 54.3 mmHg. ?2. ?3. Left atrial size was moderately dilated. ?The mitral valve is normal in structure. Mild to moderate mitral valve  regurgitation. No ?evidence of mitral stenosis. MR PISA: MR radius 0.3, MR ERO 0.05cm2, MR volume ?7cc consistent with mild MR but visually MR appears moderate. ?4. ?5. Tricuspid valve regurgitation is mild to moderate. ?There is heavy calcification of the non coronary cusp. The aortic valve is calcified. ?Aortic valve regurgitation is not visualized. Aortic valve sclerosis/calcification is ?present, without any evidence of aortic stenosis. ?6. ?The inferior vena cava is normal in size with <50% respiratory variability, suggesting ?right atrial pressure of 8 mmHg. ? ?Assessment and Plan:  ?1. New onset Afib ?General education re afib ?Triggers discussed ?Encouraged to avoid alcohol  ?Remains in afib/flutter  rate controlled  ?Continue on diltiazem 180 mg daily  ?Discussed pursing cardioversion, risk vrs benefit  and they want to pursue ?It is scheduled for 3/24 with Dr. Oval Linsey, will be on anticoagulation for 4 weeks at that point  ?Echo reviewed with pt  ? ?2. CHA2DS2VASc  score of 3 ?Appropriately dosed on eliquis 2.5 mg bid based on body weight and age  ?Bleeding precautions  discussed ?Reminded not to miss any doses   ? ?F/u in afib clinic one week after cardioversion ?I will refer to establish her with Dr. Oval Linsey in 1-2 months  ? ?Geroge Baseman Kayleen Memos, ANP-C ?Afib Clinic ?[facility] ?[address] ?[place],  [address] ?[phone]  ? ?

## 2021-11-24 NOTE — H&P (View-Only) (Signed)
? ?Primary Care Physician: Griffin, John, MD ?Referring Physician:Dr. Welda ? ? ?[name] Rice is a 81 y.o. female with a h/o bronchiectasis, remote breast CA of L breast, tx with radiation and chemo, recent dx new onset afib at PCP's office in February.  Started on eliquis 2.5 mg bid 11/04/21 and diltiazem 180 mg daily for rate control. Echo ordered and also showed afib at time of echo. She is now in the afib clinic for further evaluation/treatment of afib. She is her with her daughter who is a local Pediatrician. She remains in afib, rate controlled.  ? ?Triggers discussed. For the most part drinks nightly glass of wine. Snores but no apnea witnessed. Drinks decaf products. No tobacco.  ? ?Today, she denies symptoms of palpitations, chest pain, shortness of breath, orthopnea, PND, lower extremity edema, dizziness, presyncope, syncope, or neurologic sequela. Chronic cough.  The patient is tolerating medications without difficulties and is otherwise without complaint today.  ? ?Past Medical History:  ?Diagnosis Date  ? Bronchiectasis ([facility])   ? Cancer ([facility])   ? remission- breast  ? Glaucoma   ? ?Past Surgical History:  ?Procedure Laterality Date  ? BLADDER REPAIR    ? BREAST LUMPECTOMY Left 2009  ? ? ?Current Outpatient Medications  ?Medication Sig Dispense Refill  ? acetaminophen (TYLENOL) 325 MG tablet Take 325 mg by mouth at bedtime.     ? albuterol (VENTOLIN HFA) 108 (90 Base) MCG/ACT inhaler INHALE 2 PUFFS INTO THE LUNGS EVERY 6 HOURS AS NEEDED FOR WHEEZING/SHORNTESS OF BREATH (Patient taking differently: Inhale 2 puffs into the lungs every 6 (six) hours as needed for wheezing or shortness of breath.) 6.7 g 1  ? AZOPT 1 % ophthalmic suspension Place 1 drop into both eyes in the morning and at bedtime.   11  ? benzonatate (TESSALON) 200 MG capsule Take 1 capsule (200 mg total) by mouth 3 (three) times daily as needed for cough. (Patient not taking: Reported on 07/28/2021) 90 capsule 1  ? Cholecalciferol 50  MCG (2000 UT) CAPS Take by mouth. (Patient not taking: Reported on 07/28/2021)    ? Dextromethorphan HBr (DELSYM PO) Take 10 mg by mouth 2 (two) times daily.    ? guaiFENesin (MUCINEX) 600 MG 12 hr tablet Take 1 tablet (600 mg total) by mouth 2 (two) times daily. 30 tablet 2  ? ibuprofen (ADVIL) 100 MG tablet Take 100 mg by mouth every 8 (eight) hours as needed for fever.    ? latanoprost (XALATAN) 0.005 % ophthalmic solution Place 1 drop into both eyes at bedtime.     ? montelukast (SINGULAIR) 10 MG tablet TAKE 1 TABLET BY MOUTH EVERYDAY AT BEDTIME 90 tablet 1  ? sodium chloride HYPERTONIC 3 % nebulizer solution Take by nebulization daily. 750 mL 5  ? ?No current facility-administered medications for this encounter.  ? ? ?Allergies  ?Allergen Reactions  ? Apraclonidine Other (See Comments)  ?  Redness of eye  ? Iodine Other (See Comments)  ?  Iodine in eye drops - redness of eyes  ? ? ?Social History  ? ?Socioeconomic History  ? Marital status: Married  ?  Spouse name: Not on file  ? Number of children: Not on file  ? Years of education: Not on file  ? Highest education level: Not on file  ?Occupational History  ? Not on file  ?Tobacco Use  ? Smoking status: Former  ?  Packs/day: 0.25  ?  Years: 3.00  ?  Pack years: 0.75  ?    Types: Cigarettes  ?  Quit date: 09/12/1961  ?  Years since quitting: 60.2  ? Smokeless tobacco: Never  ?Vaping Use  ? Vaping Use: Never used  ?Substance and Sexual Activity  ? Alcohol use: Yes  ? Drug use: No  ? Sexual activity: Not on file  ?Other Topics Concern  ? Not on file  ?Social History Narrative  ? Not on file  ? ?Social Determinants of Health  ? ?Financial Resource Strain: Not on file  ?Food Insecurity: Not on file  ?Transportation Needs: Not on file  ?Physical Activity: Not on file  ?Stress: Not on file  ?Social Connections: Not on file  ?Intimate Partner Violence: Not on file  ? ? ?No family history on file. ? ?ROS- All systems are reviewed and negative except as per the HPI  above ? ?Physical Exam: ?Vitals:  ? 11/24/21 1404  ?Height: 5' 2.99" (1.6 m)  ? ?Wt Readings from Last 3 Encounters:  ?07/28/21 48.2 kg  ?03/25/21 48.7 kg  ?11/10/20 50.5 kg  ? ? ?Labs: ?Lab Results  ?Component Value Date  ? NA 139 06/28/2020  ? K 3.7 06/28/2020  ? CL 103 06/28/2020  ? CO2 24 06/28/2020  ? GLUCOSE 104 (H) 06/28/2020  ? BUN 8 06/28/2020  ? CREATININE 0.56 06/28/2020  ? CALCIUM 8.6 (L) 06/28/2020  ? ?No results found for: INR ?Lab Results  ?Component Value Date  ? CHOL 161 07/24/2007  ? HDL 54 07/24/2007  ? LDLCALC 98 07/24/2007  ? TRIG 43 07/24/2007  ? ? ? ?GEN- The patient is well appearing, alert and oriented x 3 today.   ?Head- normocephalic, atraumatic ?Eyes-  Sclera clear, conjunctiva pink ?Ears- hearing intact ?Oropharynx- clear ?Neck- supple, no JVP ?Lymph- no cervical lymphadenopathy ?Lungs- Clear to ausculation bilaterally, normal work of breathing ?Heart- irregular rate and rhythm, no murmurs, rubs or gallops, PMI not laterally displaced ?GI- soft, NT, ND, + BS ?Extremities- no clubbing, cyanosis, or edema ?MS- no significant deformity or atrophy ?Skin- no rash or lesion ?Psych- euthymic mood, full affect ?Neuro- strength and sensation are intact ? ?EKG- atrial flutter with AV block, qrs int 70 ms, qtc 435 ms   ? ?Echo-r Comments:  ?Technically difficult study. ?IMPRESSIONS ?Left ventricular ejection fraction, by estimation, is 60 to 65%. The left ventricle has ?normal function. The left ventricle has no regional wall motion abnormalities. There is ?mild concentric left ventricular hypertrophy. Left ventricular diastolic function could ?not be evaluated. ?1. ?Right ventricular systolic function is normal. The right ventricular size is normal. ?There is mildly elevated pulmonary artery systolic pressure. The estimated right ?ventricular systolic pressure is 41.4 mmHg. ?2. ?3. Left atrial size was moderately dilated. ?The mitral valve is normal in structure. Mild to moderate mitral valve  regurgitation. No ?evidence of mitral stenosis. MR PISA: MR radius 0.3, MR ERO 0.05cm2, MR volume ?7cc consistent with mild MR but visually MR appears moderate. ?4. ?5. Tricuspid valve regurgitation is mild to moderate. ?There is heavy calcification of the non coronary cusp. The aortic valve is calcified. ?Aortic valve regurgitation is not visualized. Aortic valve sclerosis/calcification is ?present, without any evidence of aortic stenosis. ?6. ?The inferior vena cava is normal in size with <50% respiratory variability, suggesting ?right atrial pressure of 8 mmHg. ? ?Assessment and Plan:  ?1. New onset Afib ?General education re afib ?Triggers discussed ?Encouraged to avoid alcohol  ?Remains in afib/flutter  rate controlled  ?Continue on diltiazem 180 mg daily  ?Discussed pursing cardioversion, risk vrs benefit    and they want to pursue ?It is scheduled for 3/24 with Dr. Ider, will be on anticoagulation for 4 weeks at that point  ?Echo reviewed with pt  ? ?2. CHA2DS2VASc  score of 3 ?Appropriately dosed on eliquis 2.5 mg bid based on body weight and age  ?Bleeding precautions  discussed ?Reminded not to miss any doses   ? ?F/u in afib clinic one week after cardioversion ?I will refer to establish her with Dr. [place] in 1-2 months  ? ?Johny Pitstick C. Tamiyah Moulin, ANP-C ?Afib Clinic ?[facility] ?[address] ?[place], [place] [address] ?[phone]  ? ?

## 2021-11-24 NOTE — Patient Instructions (Addendum)
Cardioversion scheduled for Friday, March 24th ? - Arrive at the [facility] and go to admitting at 10am ? - Do not eat or drink anything after midnight the night prior to your procedure. ? - Take all your morning medication (except diabetic medications) with a sip of water prior to arrival. ? - You will not be able to drive home after your procedure. ? - Do NOT miss any doses of your blood thinner - if you should miss a dose please notify our office immediately. ? - If you feel as if you go back into normal rhythm prior to scheduled cardioversion, please notify our office immediately. If your procedure is canceled in the cardioversion suite you will be charged a cancellation fee. ? ? ? ?Follow up with Dr. Oval Linsey in 2-3 months - office will contact to schedule ? ?

## 2021-11-25 ENCOUNTER — Encounter (HOSPITAL_COMMUNITY): Payer: Self-pay | Admitting: Cardiovascular Disease

## 2021-12-03 ENCOUNTER — Ambulatory Visit (HOSPITAL_BASED_OUTPATIENT_CLINIC_OR_DEPARTMENT_OTHER): Payer: Medicare Other | Admitting: Certified Registered"

## 2021-12-03 ENCOUNTER — Encounter (HOSPITAL_COMMUNITY)
Admission: RE | Disposition: A | Discharge status: Home / Self Care | Payer: Self-pay | Source: Home / Self Care | Attending: Cardiovascular Disease

## 2021-12-03 ENCOUNTER — Ambulatory Visit (HOSPITAL_COMMUNITY): Payer: Medicare Other | Admitting: Certified Registered"

## 2021-12-03 ENCOUNTER — Ambulatory Visit (HOSPITAL_COMMUNITY)
Admission: RE | Admit: 2021-12-03 | Discharge: 2021-12-03 | Disposition: A | Discharge status: Home / Self Care | Payer: Medicare Other | Attending: Cardiovascular Disease | Admitting: Cardiovascular Disease

## 2021-12-03 ENCOUNTER — Encounter (HOSPITAL_COMMUNITY): Payer: Self-pay | Admitting: Cardiovascular Disease

## 2021-12-03 ENCOUNTER — Other Ambulatory Visit: Payer: Self-pay

## 2021-12-03 DIAGNOSIS — I4819 Other persistent atrial fibrillation: Secondary | ICD-10-CM | POA: Diagnosis not present

## 2021-12-03 DIAGNOSIS — I4892 Unspecified atrial flutter: Secondary | ICD-10-CM | POA: Insufficient documentation

## 2021-12-03 DIAGNOSIS — K219 Gastro-esophageal reflux disease without esophagitis: Secondary | ICD-10-CM | POA: Insufficient documentation

## 2021-12-03 DIAGNOSIS — Z7901 Long term (current) use of anticoagulants: Secondary | ICD-10-CM | POA: Diagnosis not present

## 2021-12-03 DIAGNOSIS — I491 Atrial premature depolarization: Secondary | ICD-10-CM | POA: Insufficient documentation

## 2021-12-03 DIAGNOSIS — I4891 Unspecified atrial fibrillation: Secondary | ICD-10-CM | POA: Diagnosis not present

## 2021-12-03 DIAGNOSIS — Z79899 Other long term (current) drug therapy: Secondary | ICD-10-CM | POA: Diagnosis not present

## 2021-12-03 DIAGNOSIS — I081 Rheumatic disorders of both mitral and tricuspid valves: Secondary | ICD-10-CM | POA: Diagnosis not present

## 2021-12-03 DIAGNOSIS — Z87891 Personal history of nicotine dependence: Secondary | ICD-10-CM | POA: Diagnosis not present

## 2021-12-03 HISTORY — PX: CARDIOVERSION: SHX1299

## 2021-12-03 SURGERY — CARDIOVERSION
Anesthesia: General

## 2021-12-03 MED ORDER — LIDOCAINE 2% (20 MG/ML) 5 ML SYRINGE
INTRAMUSCULAR | Status: DC | PRN
  Administered 2021-12-03: 40 mg via INTRAVENOUS

## 2021-12-03 MED ORDER — PROPOFOL 10 MG/ML IV BOLUS
INTRAVENOUS | Status: DC | PRN
  Administered 2021-12-03: 60 mg via INTRAVENOUS

## 2021-12-03 MED ORDER — SODIUM CHLORIDE 0.9 % IV SOLN: INTRAVENOUS | Status: DC

## 2021-12-03 NOTE — Anesthesia Postprocedure Evaluation (Signed)
Anesthesia Post Note ? ?Patient: [name] Rice ? ?Procedure(s) Performed: CARDIOVERSION ? ?  ? ?Patient location during evaluation: PACU ?Anesthesia Type: General ?Level of consciousness: awake and alert ?Pain management: pain level controlled ?Vital Signs Assessment: post-procedure vital signs reviewed and stable ?Respiratory status: spontaneous breathing, nonlabored ventilation and respiratory function stable ?Cardiovascular status: blood pressure returned to baseline and stable ?Postop Assessment: no apparent nausea or vomiting ?Anesthetic complications: no ? ? ?No notable events documented. ? ?Last Vitals:  ?Vitals:  ? 12/03/21 1049 12/03/21 1056  ?BP: (!) 102/51 (!) 109/56  ?Pulse: 65 65  ?Resp: (!) 24 (!) 23  ?Temp:    ?SpO2: 94% 93%  ?  ?Last Pain:  ?Vitals:  ? 12/03/21 1056  ?TempSrc:   ?PainSc: 0-No pain  ? ? ?  ?  ?  ?  ?  ?  ? ?Lynda Rainwater ? ? ? ? ?

## 2021-12-03 NOTE — CV Procedure (Signed)
Electrical Cardioversion Procedure Note ?[name] Rice ?[id number] ?September 19, 1940 ? ?Procedure: Electrical Cardioversion ?Indications:  Atrial Fibrillation ? ?Procedure Details ?Consent: Risks of procedure as well as the alternatives and risks of each were explained to the (patient/caregiver).  Consent for procedure obtained. ?Time Out: Verified patient identification, verified procedure, site/side was marked, verified correct patient position, special equipment/implants available, medications/allergies/relevent history reviewed, required imaging and test results available.  Performed ? ?Patient placed on cardiac monitor, pulse oximetry, supplemental oxygen as necessary.  ?Sedation given:  propofol ?Pacer pads placed anterior and posterior chest. ? ?Cardioverted 1 time(s).  ?Cardioverted at 120J. ? ?Evaluation ?Findings: Post procedure EKG shows:  sinus rhythm with frequent PACs ?Complications: None ?Patient did tolerate procedure well. ? ? ?Skeet Latch, MD ?12/03/2021, 10:34 AM ? ? ? ?

## 2021-12-03 NOTE — Discharge Instructions (Signed)

## 2021-12-03 NOTE — Anesthesia Procedure Notes (Signed)
Procedure Name: General with mask airway ?Date/Time: 12/03/2021 10:38 AM ?Performed by: Imagene Riches, CRNA ?Pre-anesthesia Checklist: Patient identified, Emergency Drugs available, Suction available, Patient being monitored and Timeout performed ?Patient Re-evaluated:Patient Re-evaluated prior to induction ?Oxygen Delivery Method: Ambu bag ? ? ? ? ?

## 2021-12-03 NOTE — Interval H&P Note (Signed)
History and Physical Interval Note: ? ?12/03/2021 ?10:00 AM ? ?[name] Rice  has presented today for surgery, with the diagnosis of AFIB.  The various methods of treatment have been discussed with the patient and family. After consideration of risks, benefits and other options for treatment, the patient has consented to  Procedure(s): ?CARDIOVERSION (N/A) as a surgical intervention.  The patient's history has been reviewed, patient examined, no change in status, stable for surgery.  I have reviewed the patient's chart and labs.  Questions were answered to the patient's satisfaction.   ? ? ?Skeet Latch, MD ? ? ?

## 2021-12-03 NOTE — Anesthesia Preprocedure Evaluation (Signed)
Anesthesia Evaluation  ?Patient identified by MRN, date of birth, ID band ?Patient awake ? ? ? ?Reviewed: ?Allergy & Precautions, NPO status , Patient's Chart, lab work & pertinent test results ? ?Airway ?Mallampati: II ? ?TM Distance: >3 FB ?Neck ROM: Full ? ? ? Dental ?no notable dental hx. ? ?  ?Pulmonary ?neg pulmonary ROS, former smoker,  ?  ?Pulmonary exam normal ?breath sounds clear to auscultation ? ? ? ? ? ? Cardiovascular ?Normal cardiovascular exam+ dysrhythmias Atrial Fibrillation  ?Rhythm:Regular Rate:Normal ? ? ?  ?Neuro/Psych ?negative neurological ROS ? negative psych ROS  ? GI/Hepatic ?Neg liver ROS, GERD  ,  ?Endo/Other  ?negative endocrine ROS ? Renal/GU ?negative Renal ROS  ?negative genitourinary ?  ?Musculoskeletal ?negative musculoskeletal ROS ?(+)  ? Abdominal ?  ?Peds ?negative pediatric ROS ?(+)  Hematology ?negative hematology ROS ?(+)   ?Anesthesia Other Findings ? ? Reproductive/Obstetrics ?negative OB ROS ? ?  ? ? ? ? ? ? ? ? ? ? ? ? ? ?  ?  ? ? ? ? ? ? ? ? ?Anesthesia Physical ?Anesthesia Plan ? ?ASA: 3 ? ?Anesthesia Plan: General  ? ?Post-op Pain Management: Minimal or no pain anticipated  ? ?Induction: Intravenous ? ?PONV Risk Score and Plan: 3 and Treatment may vary due to age or medical condition ? ?Airway Management Planned: Mask ? ?Additional Equipment:  ? ?Intra-op Plan:  ? ?Post-operative Plan:  ? ?Informed Consent: I have reviewed the patients History and Physical, chart, labs and discussed the procedure including the risks, benefits and alternatives for the proposed anesthesia with the patient or authorized representative who has indicated his/her understanding and acceptance.  ? ? ? ?Dental advisory given ? ?Plan Discussed with: CRNA ? ?Anesthesia Plan Comments:   ? ? ? ? ? ? ?Anesthesia Quick Evaluation ? ?

## 2021-12-03 NOTE — Transfer of Care (Signed)
Immediate Anesthesia Transfer of Care Note ? ?Patient: [name] Rice ? ?Procedure(s) Performed: CARDIOVERSION ? ?Patient Location: Endoscopy Unit ? ?Anesthesia Type:General ? ?Level of Consciousness: drowsy ? ?Airway & Oxygen Therapy: Patient Spontanous Breathing ? ?Post-op Assessment: Report given to RN and Post -op Vital signs reviewed and stable ? ?Post vital signs: Reviewed and stable ? ?Last Vitals:  ?Vitals Value Taken Time  ?BP    ?Temp    ?Pulse    ?Resp    ?SpO2    ? ? ?Last Pain:  ?Vitals:  ? 12/03/21 1010  ?TempSrc: Temporal  ?PainSc: 0-No pain  ?   ? ?  ? ?Complications: No notable events documented. ?

## 2021-12-06 ENCOUNTER — Encounter (HOSPITAL_COMMUNITY): Payer: Self-pay | Admitting: Cardiovascular Disease

## 2021-12-08 ENCOUNTER — Ambulatory Visit (HOSPITAL_COMMUNITY)
Admission: RE | Admit: 2021-12-08 | Discharge: 2021-12-08 | Disposition: A | Discharge status: Home / Self Care | Payer: Medicare Other | Source: Ambulatory Visit | Attending: Nurse Practitioner | Admitting: Nurse Practitioner

## 2021-12-08 VITALS — BP 124/46 | HR 93 | Ht 63.0 in | Wt 112.4 lb

## 2021-12-08 DIAGNOSIS — I4439 Other atrioventricular block: Secondary | ICD-10-CM | POA: Diagnosis not present

## 2021-12-08 DIAGNOSIS — R053 Chronic cough: Secondary | ICD-10-CM | POA: Insufficient documentation

## 2021-12-08 DIAGNOSIS — Z853 Personal history of malignant neoplasm of breast: Secondary | ICD-10-CM | POA: Diagnosis not present

## 2021-12-08 DIAGNOSIS — Z8709 Personal history of other diseases of the respiratory system: Secondary | ICD-10-CM | POA: Insufficient documentation

## 2021-12-08 DIAGNOSIS — I4891 Unspecified atrial fibrillation: Secondary | ICD-10-CM | POA: Insufficient documentation

## 2021-12-08 DIAGNOSIS — I484 Atypical atrial flutter: Secondary | ICD-10-CM | POA: Diagnosis not present

## 2021-12-08 DIAGNOSIS — Z923 Personal history of irradiation: Secondary | ICD-10-CM | POA: Insufficient documentation

## 2021-12-08 DIAGNOSIS — Z9221 Personal history of antineoplastic chemotherapy: Secondary | ICD-10-CM | POA: Diagnosis not present

## 2021-12-08 DIAGNOSIS — Z79899 Other long term (current) drug therapy: Secondary | ICD-10-CM | POA: Insufficient documentation

## 2021-12-08 DIAGNOSIS — R9431 Abnormal electrocardiogram [ECG] [EKG]: Secondary | ICD-10-CM | POA: Diagnosis not present

## 2021-12-08 DIAGNOSIS — R0683 Snoring: Secondary | ICD-10-CM | POA: Diagnosis not present

## 2021-12-08 DIAGNOSIS — Z7901 Long term (current) use of anticoagulants: Secondary | ICD-10-CM | POA: Diagnosis not present

## 2021-12-08 DIAGNOSIS — D6869 Other thrombophilia: Secondary | ICD-10-CM

## 2021-12-08 DIAGNOSIS — I083 Combined rheumatic disorders of mitral, aortic and tricuspid valves: Secondary | ICD-10-CM | POA: Insufficient documentation

## 2021-12-08 DIAGNOSIS — I4892 Unspecified atrial flutter: Secondary | ICD-10-CM | POA: Diagnosis not present

## 2021-12-08 DIAGNOSIS — I700 Atherosclerosis of aorta: Secondary | ICD-10-CM | POA: Insufficient documentation

## 2021-12-09 ENCOUNTER — Encounter (HOSPITAL_COMMUNITY): Payer: Self-pay | Admitting: Nurse Practitioner

## 2021-12-09 NOTE — Progress Notes (Signed)
? ?Primary Care Physician: Lavone Orn, MD ?Referring Physician:Dr. Oval Linsey ? ? ?[name] Rice is a 81 y.o. female with a h/o bronchiectasis, remote breast CA of L breast, tx with radiation and chemo, recent dx new onset afib at [facility] office in February.  Started on eliquis 2.5 mg bid 11/04/21 and diltiazem 180 mg daily for rate control. Echo ordered and also showed afib at time of echo. She is now in the afib clinic for further evaluation/treatment of afib. She is her with her daughter who is a Contractor. She remains in afib, rate controlled.  ? ?Triggers discussed. For the most part drinks nightly glass of wine. Snores but no apnea witnessed. Drinks decaf products. No tobacco.  ? ?F/u in afib clinic, 12/07/21. She did have a successful cardioversion but unfortunately went back into atrial flutter, date unknown to pt. She states she did not feel any different in rhythm than today. Her husband states that he thought she appeared to breath easier.  ? ?Today, she denies symptoms of palpitations, chest pain, shortness of breath, orthopnea, PND, lower extremity edema, dizziness, presyncope, syncope, or neurologic sequela. Chronic cough.  The patient is tolerating medications without difficulties and is otherwise without complaint today.  ? ?Past Medical History:  ?Diagnosis Date  ? Bronchiectasis ([place])   ? Cancer [facility])   ? remission- breast  ? Glaucoma   ? ?Past Surgical History:  ?Procedure Laterality Date  ? BLADDER REPAIR    ? BREAST LUMPECTOMY Left 2009  ? CARDIOVERSION N/A 12/03/2021  ? Procedure: CARDIOVERSION;  Surgeon: Skeet Latch, MD;  Location: [place];  Service: Cardiovascular;  Laterality: N/A;  ? ? ?Current Outpatient Medications  ?Medication Sig Dispense Refill  ? acetaminophen (TYLENOL) 325 MG tablet Take 325 mg by mouth every 6 (six) hours as needed for mild pain.    ? albuterol (VENTOLIN HFA) 108 (90 Base) MCG/ACT inhaler INHALE 2 PUFFS INTO THE LUNGS EVERY 6 HOURS AS NEEDED FOR  WHEEZING/SHORNTESS OF BREATH (Patient taking differently: Inhale 2 puffs into the lungs every 6 (six) hours as needed for wheezing or shortness of breath.) 6.7 g 1  ? AZOPT 1 % ophthalmic suspension Place 1 drop into the right eye in the morning and at bedtime.  11  ? Cholecalciferol 50 MCG (2000 UT) CAPS Take 2,000 Units by mouth 3 (three) times a week.    ? Dextromethorphan HBr (DELSYM PO) Take 10 mLs by mouth at bedtime.    ? DILT-XR 180 MG 24 hr capsule Take 180 mg by mouth daily.    ? ELIQUIS 2.5 MG TABS tablet Take 2.5 mg by mouth 2 (two) times daily.    ? latanoprost (XALATAN) 0.005 % ophthalmic solution Place 1 drop into the right eye at bedtime.    ? sodium chloride HYPERTONIC 3 % nebulizer solution Take by nebulization daily. (Patient taking differently: Take 4 mLs by nebulization daily as needed (shortness of breath).) 750 mL 5  ? ?No current facility-administered medications for this encounter.  ? ? ?Allergies  ?Allergen Reactions  ? Apraclonidine Other (See Comments)  ?  Redness of eye  ? Iodine Other (See Comments)  ?  Iodine in eye drops - redness of eyes  ? ? ?Social History  ? ?Socioeconomic History  ? Marital status: Married  ?  Spouse name: Not on file  ? Number of children: Not on file  ? Years of education: Not on file  ? Highest education level: Not on file  ?Occupational History  ?  Not on file  ?Tobacco Use  ? Smoking status: Former  ?  Packs/day: 0.25  ?  Years: 3.00  ?  Pack years: 0.75  ?  Types: Cigarettes  ?  Quit date: 09/12/1961  ?  Years since quitting: 60.2  ? Smokeless tobacco: Never  ?Vaping Use  ? Vaping Use: Never used  ?Substance and Sexual Activity  ? Alcohol use: Yes  ? Drug use: No  ? Sexual activity: Not on file  ?Other Topics Concern  ? Not on file  ?Social History Narrative  ? Not on file  ? ?Social Determinants of Health  ? ?Financial Resource Strain: Not on file  ?Food Insecurity: Not on file  ?Transportation Needs: Not on file  ?Physical Activity: Not on file  ?Stress: Not  on file  ?Social Connections: Not on file  ?Intimate Partner Violence: Not on file  ? ? ?No family history on file. ? ?ROS- All systems are reviewed and negative except as per the HPI above ? ?Physical Exam: ?Vitals:  ? 12/08/21 1522  ?BP: (!) 124/46  ?Pulse: 93  ?Weight: 51 kg  ?Height: _0  (1.6 m)  ? ?Wt Readings from Last 3 Encounters:  ?12/08/21 51 kg  ?12/03/21 50.3 kg  ?11/24/21 50.3 kg  ? ? ?Labs: ?Lab Results  ?Component Value Date  ? NA 133 (L) 11/24/2021  ? K 5.0 11/24/2021  ? CL 98 11/24/2021  ? CO2 25 11/24/2021  ? GLUCOSE 105 (H) 11/24/2021  ? BUN 7 (L) 11/24/2021  ? CREATININE 0.55 11/24/2021  ? CALCIUM 8.8 (L) 11/24/2021  ? ?No results found for: INR ?Lab Results  ?Component Value Date  ? CHOL 161 07/24/2007  ? HDL 54 07/24/2007  ? [place] 98 07/24/2007  ? TRIG 43 07/24/2007  ? ? ? ?GEN- The patient is well appearing, alert and oriented x 3 today.   ?Head- normocephalic, atraumatic ?Eyes-  Sclera clear, conjunctiva pink ?Ears- hearing intact ?Oropharynx- clear ?Neck- supple, no JVP ?Lymph- no cervical lymphadenopathy ?Lungs- Clear to ausculation bilaterally, normal work of breathing ?Heart- irregular rate and rhythm, no murmurs, rubs or gallops, PMI not laterally displaced ?GI- soft, NT, ND, + BS ?Extremities- no clubbing, cyanosis, or edema ?MS- no significant deformity or atrophy ?Skin- no rash or lesion ?Psych- euthymic mood, full affect ?Neuro- strength and sensation are intact ? ?EKG- atrial flutter with AV block, qrs int 70 ms, qtc 435 ms   ? ?Echo-r Comments:  ?Technically difficult study. ?IMPRESSIONS ?Left ventricular ejection fraction, by estimation, is 60 to 65%. The left ventricle has ?normal function. The left ventricle has no regional wall motion abnormalities. There is ?mild concentric left ventricular hypertrophy. Left ventricular diastolic function could ?not be evaluated. ?1. ?Right ventricular systolic function is normal. The right ventricular size is normal. ?There is mildly  elevated pulmonary artery systolic pressure. The estimated right ?ventricular systolic pressure is 00.9 mmHg. ?2. ?3. Left atrial size was moderately dilated. ?The mitral valve is normal in structure. Mild to moderate mitral valve regurgitation. No ?evidence of mitral stenosis. MR PISA: MR radius 0.3, MR ERO 0.05cm2, MR volume ?7cc consistent with mild MR but visually MR appears moderate. ?4. ?5. Tricuspid valve regurgitation is mild to moderate. ?There is heavy calcification of the non coronary cusp. The aortic valve is calcified. ?Aortic valve regurgitation is not visualized. Aortic valve sclerosis/calcification is ?present, without any evidence of aortic stenosis. ?6. ?The inferior vena cava is normal in size with <50% respiratory variability, suggesting ?right atrial pressure of  8 mmHg. ? ?Assessment and Plan:  ?1. New onset Afib ?Successful  cardioversion but early return atrial flutter ?Remains in afib/flutter  rate controlled  ?Continue on diltiazem 180 mg daily  ?Discussed options, living in afib, or antiarrythmic's  ?I dicussed amiodarone although I would have to ask Dr. Elsworth Soho if he felt this would be an option with her lung status or Tikosyn may be an option, crcl cal at 63.25  ?Qt is acceptable  ? ?2. CHA2DS2VASc  score of 3 ?Appropriately dosed on eliquis 2.5 mg bid based on body weight and age  ?Bleeding precautions  discussed ?Reminded not to miss any doses   ? ?I have asked for their daughter to call and talk with me as well to help mother made the best decision  ? ?Geroge Baseman Kayleen Memos, ANP-C ?Afib Clinic ?[facility] ?[address] ?[place], [place] [address] ?[phone]  ? ?

## 2021-12-14 DIAGNOSIS — H401133 Primary open-angle glaucoma, bilateral, severe stage: Secondary | ICD-10-CM | POA: Diagnosis not present

## 2021-12-20 DIAGNOSIS — Z7189 Other specified counseling: Secondary | ICD-10-CM | POA: Diagnosis not present

## 2022-01-10 DIAGNOSIS — R233 Spontaneous ecchymoses: Secondary | ICD-10-CM | POA: Diagnosis not present

## 2022-01-10 DIAGNOSIS — R600 Localized edema: Secondary | ICD-10-CM | POA: Diagnosis not present

## 2022-01-12 ENCOUNTER — Ambulatory Visit
Admission: RE | Admit: 2022-01-12 | Discharge: 2022-01-12 | Disposition: A | Discharge status: Home / Self Care | Payer: Medicare Other | Source: Ambulatory Visit | Attending: Internal Medicine | Admitting: Internal Medicine

## 2022-01-12 ENCOUNTER — Other Ambulatory Visit: Payer: Self-pay | Admitting: Internal Medicine

## 2022-01-12 DIAGNOSIS — R042 Hemoptysis: Secondary | ICD-10-CM

## 2022-01-12 DIAGNOSIS — D692 Other nonthrombocytopenic purpura: Secondary | ICD-10-CM | POA: Diagnosis not present

## 2022-01-12 DIAGNOSIS — R059 Cough, unspecified: Secondary | ICD-10-CM | POA: Diagnosis not present

## 2022-01-12 DIAGNOSIS — J449 Chronic obstructive pulmonary disease, unspecified: Secondary | ICD-10-CM | POA: Diagnosis not present

## 2022-01-19 DIAGNOSIS — I330 Acute and subacute infective endocarditis: Secondary | ICD-10-CM | POA: Diagnosis not present

## 2022-01-19 DIAGNOSIS — J479 Bronchiectasis, uncomplicated: Secondary | ICD-10-CM | POA: Diagnosis not present

## 2022-01-19 DIAGNOSIS — D692 Other nonthrombocytopenic purpura: Secondary | ICD-10-CM | POA: Diagnosis not present

## 2022-01-19 DIAGNOSIS — R5383 Other fatigue: Secondary | ICD-10-CM | POA: Diagnosis not present

## 2022-01-19 DIAGNOSIS — R600 Localized edema: Secondary | ICD-10-CM | POA: Diagnosis not present

## 2022-01-20 DIAGNOSIS — R5383 Other fatigue: Secondary | ICD-10-CM | POA: Diagnosis not present

## 2022-01-20 DIAGNOSIS — I330 Acute and subacute infective endocarditis: Secondary | ICD-10-CM | POA: Diagnosis not present

## 2022-01-31 DIAGNOSIS — R319 Hematuria, unspecified: Secondary | ICD-10-CM | POA: Diagnosis not present

## 2022-02-03 DIAGNOSIS — I482 Chronic atrial fibrillation, unspecified: Secondary | ICD-10-CM | POA: Diagnosis not present

## 2022-02-03 DIAGNOSIS — R21 Rash and other nonspecific skin eruption: Secondary | ICD-10-CM | POA: Diagnosis not present

## 2022-02-03 DIAGNOSIS — R609 Edema, unspecified: Secondary | ICD-10-CM | POA: Diagnosis not present

## 2022-02-03 DIAGNOSIS — R3129 Other microscopic hematuria: Secondary | ICD-10-CM | POA: Diagnosis not present

## 2022-02-03 DIAGNOSIS — J479 Bronchiectasis, uncomplicated: Secondary | ICD-10-CM | POA: Diagnosis not present

## 2022-02-25 DIAGNOSIS — R5383 Other fatigue: Secondary | ICD-10-CM | POA: Diagnosis not present

## 2022-02-25 DIAGNOSIS — R3129 Other microscopic hematuria: Secondary | ICD-10-CM | POA: Diagnosis not present

## 2022-02-25 DIAGNOSIS — R21 Rash and other nonspecific skin eruption: Secondary | ICD-10-CM | POA: Diagnosis not present

## 2022-02-28 DIAGNOSIS — L958 Other vasculitis limited to the skin: Secondary | ICD-10-CM | POA: Diagnosis not present

## 2022-02-28 DIAGNOSIS — L982 Febrile neutrophilic dermatosis [Sweet]: Secondary | ICD-10-CM | POA: Diagnosis not present

## 2022-03-03 NOTE — Progress Notes (Unsigned)
Cardiology Office Note:    Date:  03/03/2022   ID:  ANAVICTORIA WILK, DOB 1941-05-29, MRN [id number]  PCP:  Lavone Orn, MD   [facility] Providers Cardiologist:  None { Click to update primary MD,subspecialty MD or APP then REFRESH:1}    Referring MD: Lavone Orn, MD   No chief complaint on file. ***  History of Present Illness:    [name] Rice is a 81 y.o. female with a hx of bronchiectasis, remote L breast CA  s/p radiation and chemo, new onset afib chads2vasc=3 10/2021. She is rate controlled on dilt 180. She's on eliquis 2.5 mg BID. She had DCCV in ***. She converted; however went in to atrial flutter. Her echo shows normal LV function, mild pulmonary htn RVSp 41 mmhg, LA vol  index 41 cc/m2, mild-moderate MR.  Past Medical History:  Diagnosis Date   Bronchiectasis ([place])    Cancer ([place])    remission- breast   Glaucoma     Past Surgical History:  Procedure Laterality Date   BLADDER REPAIR     BREAST LUMPECTOMY Left 2009   CARDIOVERSION N/A 12/03/2021   Procedure: CARDIOVERSION;  Surgeon: Skeet Latch, MD;  Location: [place];  Service: Cardiovascular;  Laterality: N/A;    Current Medications: No outpatient medications have been marked as taking for the 03/04/22 encounter (Appointment) with Janina Mayo, MD.     Allergies:   Apraclonidine and Iodine   Social History   Socioeconomic History   Marital status: Married    Spouse name: Not on file   Number of children: Not on file   Years of education: Not on file   Highest education level: Not on file  Occupational History   Not on file  Tobacco Use   Smoking status: Former    Packs/day: 0.25    Years: 3.00    Total pack years: 0.75    Types: Cigarettes    Quit date: 09/12/1961    Years since quitting: 60.5   Smokeless tobacco: Never  Vaping Use   Vaping Use: Never used  Substance and Sexual Activity   Alcohol use: Yes   Drug use: No   Sexual activity: Not on file  Other Topics Concern    Not on file  Social History Narrative   Not on file   Social Determinants of Health   Financial Resource Strain: Not on file  Food Insecurity: Not on file  Transportation Needs: Not on file  Physical Activity: Not on file  Stress: Not on file  Social Connections: Not on file     Family History: The patient's ***family history is not on file.  ROS:   Please see the history of present illness.    *** All other systems reviewed and are negative.  EKGs/Labs/Other Studies Reviewed:    The following studies were reviewed today: ***  EKG:  EKG is *** ordered today.  The ekg ordered today demonstrates ***  Recent Labs: 11/24/2021: BUN 7; Creatinine, Ser 0.55; Hemoglobin 11.9; Platelets 279; Potassium 5.0; Sodium 133  Recent Lipid Panel    Component Value Date/Time   CHOL 161 07/24/2007 2012   TRIG 43 07/24/2007 2012   HDL 54 07/24/2007 2012   CHOLHDL 3.0 Ratio 07/24/2007 2012   VLDL 9 07/24/2007 2012   [place] 98 07/24/2007 2012     Risk Assessment/Calculations:   {Does this patient have ATRIAL FIBRILLATION?:[phone]}       Physical Exam:    VS:  There were no vitals  taken for this visit.    Wt Readings from Last 3 Encounters:  12/08/21 112 lb 6.4 oz (51 kg)  12/03/21 111 lb (50.3 kg)  11/24/21 111 lb (50.3 kg)     GEN: *** Well nourished, well developed in no acute distress HEENT: Normal NECK: No JVD; No carotid bruits LYMPHATICS: No lymphadenopathy CARDIAC: ***RRR, no murmurs, rubs, gallops RESPIRATORY:  Clear to auscultation without rales, wheezing or rhonchi  ABDOMEN: Soft, non-tender, non-distended MUSCULOSKELETAL:  No edema; No deformity  SKIN: Warm and dry NEUROLOGIC:  Alert and oriented x 3 PSYCHIATRIC:  Normal affect   ASSESSMENT:    #Persistent Atrial Fibrillation/Flutter: Flutter was atypical with variable AV block. She was rate controlled. Amiodarone is not a great choice with hx of bronchiectasis. Qtc 445 ms. Tikosyn is possible. Will  continue with rate control for now.  If has RVR will send referral to EP for consideration of AAD +/- fib/flutter ablation  Pulmonary HTN: Group II with MR, and group III with lung dx.  Mild-Moderate MR:  mitral valve has sig MAC/myxomatous. Can continue to monitor  PLAN:    In order of problems listed above:  ***      {Are you ordering a CV Procedure (e.g. stress test, cath, DCCV, TEE, etc)?   Press F2        :[id number]}    Medication Adjustments/Labs and Tests Ordered: Current medicines are reviewed at length with the patient today.  Concerns regarding medicines are outlined above.  No orders of the defined types were placed in this encounter.  No orders of the defined types were placed in this encounter.   There are no Patient Instructions on file for this visit.   Signed, Janina Mayo, MD  03/03/2022 12:25 PM    [facility]

## 2022-03-04 ENCOUNTER — Ambulatory Visit (INDEPENDENT_AMBULATORY_CARE_PROVIDER_SITE_OTHER): Payer: Medicare Other | Admitting: Internal Medicine

## 2022-03-04 ENCOUNTER — Inpatient Hospital Stay (HOSPITAL_COMMUNITY)
Admission: RE | Admit: 2022-03-04 | Discharge: 2022-03-09 | DRG: 308 | Disposition: A | Discharge status: Home Health | Payer: Medicare Other | Attending: Internal Medicine | Admitting: Internal Medicine

## 2022-03-04 ENCOUNTER — Encounter: Payer: Self-pay | Admitting: Internal Medicine

## 2022-03-04 ENCOUNTER — Inpatient Hospital Stay (HOSPITAL_COMMUNITY): Payer: Medicare Other

## 2022-03-04 VITALS — BP 94/64 | HR 125 | Ht 62.0 in | Wt 112.8 lb

## 2022-03-04 DIAGNOSIS — K219 Gastro-esophageal reflux disease without esophagitis: Secondary | ICD-10-CM | POA: Diagnosis present

## 2022-03-04 DIAGNOSIS — K6389 Other specified diseases of intestine: Secondary | ICD-10-CM | POA: Diagnosis not present

## 2022-03-04 DIAGNOSIS — R54 Age-related physical debility: Secondary | ICD-10-CM | POA: Diagnosis present

## 2022-03-04 DIAGNOSIS — I2722 Pulmonary hypertension due to left heart disease: Secondary | ICD-10-CM | POA: Diagnosis not present

## 2022-03-04 DIAGNOSIS — R0600 Dyspnea, unspecified: Secondary | ICD-10-CM | POA: Diagnosis not present

## 2022-03-04 DIAGNOSIS — Z853 Personal history of malignant neoplasm of breast: Secondary | ICD-10-CM

## 2022-03-04 DIAGNOSIS — L989 Disorder of the skin and subcutaneous tissue, unspecified: Secondary | ICD-10-CM | POA: Diagnosis present

## 2022-03-04 DIAGNOSIS — I4819 Other persistent atrial fibrillation: Principal | ICD-10-CM | POA: Diagnosis present

## 2022-03-04 DIAGNOSIS — I361 Nonrheumatic tricuspid (valve) insufficiency: Secondary | ICD-10-CM | POA: Diagnosis not present

## 2022-03-04 DIAGNOSIS — I2723 Pulmonary hypertension due to lung diseases and hypoxia: Secondary | ICD-10-CM | POA: Diagnosis not present

## 2022-03-04 DIAGNOSIS — R3129 Other microscopic hematuria: Secondary | ICD-10-CM | POA: Diagnosis present

## 2022-03-04 DIAGNOSIS — D692 Other nonthrombocytopenic purpura: Secondary | ICD-10-CM | POA: Diagnosis present

## 2022-03-04 DIAGNOSIS — I4891 Unspecified atrial fibrillation: Secondary | ICD-10-CM | POA: Diagnosis not present

## 2022-03-04 DIAGNOSIS — Z7901 Long term (current) use of anticoagulants: Secondary | ICD-10-CM | POA: Diagnosis not present

## 2022-03-04 DIAGNOSIS — E876 Hypokalemia: Secondary | ICD-10-CM | POA: Diagnosis present

## 2022-03-04 DIAGNOSIS — I110 Hypertensive heart disease with heart failure: Secondary | ICD-10-CM | POA: Diagnosis present

## 2022-03-04 DIAGNOSIS — K8020 Calculus of gallbladder without cholecystitis without obstruction: Secondary | ICD-10-CM | POA: Diagnosis not present

## 2022-03-04 DIAGNOSIS — I10 Essential (primary) hypertension: Secondary | ICD-10-CM | POA: Diagnosis not present

## 2022-03-04 DIAGNOSIS — J479 Bronchiectasis, uncomplicated: Secondary | ICD-10-CM | POA: Diagnosis present

## 2022-03-04 DIAGNOSIS — H409 Unspecified glaucoma: Secondary | ICD-10-CM | POA: Diagnosis present

## 2022-03-04 DIAGNOSIS — C50919 Malignant neoplasm of unspecified site of unspecified female breast: Secondary | ICD-10-CM | POA: Diagnosis not present

## 2022-03-04 DIAGNOSIS — Z9221 Personal history of antineoplastic chemotherapy: Secondary | ICD-10-CM

## 2022-03-04 DIAGNOSIS — Z87891 Personal history of nicotine dependence: Secondary | ICD-10-CM | POA: Diagnosis not present

## 2022-03-04 DIAGNOSIS — Z923 Personal history of irradiation: Secondary | ICD-10-CM | POA: Diagnosis not present

## 2022-03-04 DIAGNOSIS — I5030 Unspecified diastolic (congestive) heart failure: Secondary | ICD-10-CM | POA: Diagnosis not present

## 2022-03-04 DIAGNOSIS — I5043 Acute on chronic combined systolic (congestive) and diastolic (congestive) heart failure: Secondary | ICD-10-CM | POA: Diagnosis not present

## 2022-03-04 DIAGNOSIS — K3189 Other diseases of stomach and duodenum: Secondary | ICD-10-CM | POA: Diagnosis not present

## 2022-03-04 DIAGNOSIS — G709 Myoneural disorder, unspecified: Secondary | ICD-10-CM | POA: Diagnosis not present

## 2022-03-04 DIAGNOSIS — D72828 Other elevated white blood cell count: Secondary | ICD-10-CM | POA: Diagnosis present

## 2022-03-04 DIAGNOSIS — I5033 Acute on chronic diastolic (congestive) heart failure: Secondary | ICD-10-CM | POA: Diagnosis present

## 2022-03-04 DIAGNOSIS — I5031 Acute diastolic (congestive) heart failure: Secondary | ICD-10-CM | POA: Diagnosis not present

## 2022-03-04 LAB — BASIC METABOLIC PANEL
Anion gap: 10 (ref 5–15)
BUN: 22 mg/dL (ref 8–23)
CO2: 24 mmol/L (ref 22–32)
Calcium: 8.8 mg/dL — ABNORMAL LOW (ref 8.9–10.3)
Chloride: 99 mmol/L (ref 98–111)
Creatinine, Ser: 0.95 mg/dL (ref 0.44–1.00)
GFR, Estimated: 59 mL/min — ABNORMAL LOW (ref >60)
Glucose, Bld: 128 mg/dL — ABNORMAL HIGH (ref 70–99)
Potassium: 4.3 mmol/L (ref 3.5–5.1)
Sodium: 133 mmol/L — ABNORMAL LOW (ref 135–145)

## 2022-03-04 LAB — BRAIN NATRIURETIC PEPTIDE: B Natriuretic Peptide: 489.3 pg/mL — ABNORMAL HIGH (ref 0.0–100.0)

## 2022-03-04 LAB — CBC
HCT: 32.4 % — ABNORMAL LOW (ref 36.0–46.0)
Hemoglobin: 10.5 g/dL — ABNORMAL LOW (ref 12.0–15.0)
MCH: 29.6 pg (ref 26.0–34.0)
MCHC: 32.4 g/dL (ref 30.0–36.0)
MCV: 91.3 fL (ref 80.0–100.0)
Platelets: 291 10*3/uL (ref 150–400)
RBC: 3.55 MIL/uL — ABNORMAL LOW (ref 3.87–5.11)
RDW: 13.4 % (ref 11.5–15.5)
WBC: 15.3 10*3/uL — ABNORMAL HIGH (ref 4.0–10.5)
nRBC: 0 % (ref 0.0–0.2)

## 2022-03-04 MED ORDER — LATANOPROST 0.005 % OP SOLN
1.0000 [drp] | Freq: Every day | OPHTHALMIC | Status: DC
  Administered 2022-03-04 – 2022-03-08 (×5): 1 [drp] via OPHTHALMIC
  Filled 2022-03-04: qty 2.5

## 2022-03-04 MED ORDER — METOPROLOL TARTRATE 5 MG/5ML IV SOLN: 2.5000 mg | Freq: Once | INTRAVENOUS | Status: DC

## 2022-03-04 MED ORDER — FUROSEMIDE 10 MG/ML IJ SOLN
20.0000 mg | Freq: Every day | INTRAMUSCULAR | Status: DC
  Administered 2022-03-04 – 2022-03-05 (×2): 20 mg via INTRAVENOUS
  Filled 2022-03-04 (×2): qty 2

## 2022-03-04 MED ORDER — ALBUTEROL SULFATE (2.5 MG/3ML) 0.083% IN NEBU
2.5000 mg | INHALATION_SOLUTION | RESPIRATORY_TRACT | Status: DC | PRN
  Filled 2022-03-04: qty 3

## 2022-03-04 MED ORDER — METOPROLOL TARTRATE 5 MG/5ML IV SOLN
5.0000 mg | Freq: Once | INTRAVENOUS | Status: AC
  Administered 2022-03-04: 5 mg via INTRAVENOUS
  Filled 2022-03-04: qty 5

## 2022-03-04 MED ORDER — ACETAMINOPHEN 325 MG PO TABS
650.0000 mg | ORAL_TABLET | ORAL | Status: DC | PRN
  Administered 2022-03-05 – 2022-03-08 (×3): 650 mg via ORAL
  Filled 2022-03-04 (×4): qty 2

## 2022-03-04 MED ORDER — ONDANSETRON HCL 4 MG/2ML IJ SOLN: 4.0000 mg | Freq: Four times a day (QID) | INTRAMUSCULAR | Status: DC | PRN

## 2022-03-04 MED ORDER — DEXTROMETHORPHAN POLISTIREX ER 30 MG/5ML PO SUER
30.0000 mg | Freq: Every evening | ORAL | Status: DC | PRN
  Administered 2022-03-05 – 2022-03-08 (×3): 30 mg via ORAL
  Filled 2022-03-04 (×3): qty 5

## 2022-03-04 MED ORDER — METOPROLOL TARTRATE 12.5 MG HALF TABLET
12.5000 mg | ORAL_TABLET | Freq: Four times a day (QID) | ORAL | Status: DC
  Administered 2022-03-04 – 2022-03-05 (×3): 12.5 mg via ORAL
  Filled 2022-03-04 (×3): qty 1

## 2022-03-04 MED ORDER — FUROSEMIDE 10 MG/ML IJ SOLN: 40.0000 mg | Freq: Every day | INTRAMUSCULAR | Status: DC

## 2022-03-04 MED ORDER — BRINZOLAMIDE 1 % OP SUSP
1.0000 [drp] | Freq: Two times a day (BID) | OPHTHALMIC | Status: DC
  Administered 2022-03-04 – 2022-03-09 (×10): 1 [drp] via OPHTHALMIC
  Filled 2022-03-04: qty 10

## 2022-03-04 MED ORDER — APIXABAN 2.5 MG PO TABS
2.5000 mg | ORAL_TABLET | Freq: Two times a day (BID) | ORAL | Status: DC
  Administered 2022-03-04 – 2022-03-05 (×3): 2.5 mg via ORAL
  Filled 2022-03-04 (×3): qty 1

## 2022-03-04 MED ORDER — SODIUM CHLORIDE 3 % IN NEBU
4.0000 mL | INHALATION_SOLUTION | Freq: Every day | RESPIRATORY_TRACT | Status: AC | PRN
  Filled 2022-03-04: qty 4

## 2022-03-04 MED ORDER — ALBUTEROL SULFATE HFA 108 (90 BASE) MCG/ACT IN AERS: 2.0000 | INHALATION_SPRAY | Freq: Four times a day (QID) | RESPIRATORY_TRACT | Status: DC | PRN

## 2022-03-04 MED ORDER — NITROGLYCERIN 0.4 MG SL SUBL: 0.4000 mg | SUBLINGUAL_TABLET | SUBLINGUAL | Status: DC | PRN

## 2022-03-04 NOTE — Progress Notes (Signed)
Notified Dr. Wyline Mood by chat, she notified Trish and provider was notified to admit. Callie PA into see patient and orders received. Patient asymptomatic with atrial fib RVR. Monitoring blood pressure closely for hypotension .

## 2022-03-04 NOTE — Progress Notes (Signed)
   03/04/22 1931  Assess: MEWS Score  Temp 98.1 F (36.7 C)  BP 108/85  MAP (mmHg) 91  Pulse Rate (!) 121  ECG Heart Rate (!) 117  Resp 19  SpO2 97 %  O2 Device Room Air  Assess: MEWS Score  MEWS Temp 0  MEWS Systolic 0  MEWS Pulse 2  MEWS RR 0  MEWS LOC 0  MEWS Score 2  MEWS Score Color Yellow  Assess: if the MEWS score is Yellow or Red  Were vital signs taken at a resting state? Yes  Focused Assessment No change from prior assessment  Does the patient meet 2 or more of the SIRS criteria? Yes  Does the patient have a confirmed or suspected source of infection? No  MEWS guidelines implemented *See Row Information* Yes  Treat  Pain Scale 0-10  Pain Score 0  Take Vital Signs  Increase Vital Sign Frequency  Yellow: Q 2hr X 2 then Q 4hr X 2, if remains yellow, continue Q 4hrs  Escalate  MEWS: Escalate Yellow: discuss with charge nurse/RN and consider discussing with provider and RRT  Notify: Charge Nurse/RN  Name of Charge Nurse/RN Notified Dana, RN  Date Charge Nurse/RN Notified 03/04/22  Time Charge Nurse/RN Notified 1931  Document  Patient Outcome Other (Comment) (Stable)  Progress note created (see row info) Yes  Assess: SIRS CRITERIA  SIRS Temperature  0  SIRS Pulse 1  SIRS Respirations  0  SIRS WBC 1  SIRS Score Sum  2

## 2022-03-05 ENCOUNTER — Inpatient Hospital Stay (HOSPITAL_COMMUNITY): Payer: Medicare Other

## 2022-03-05 DIAGNOSIS — I5031 Acute diastolic (congestive) heart failure: Secondary | ICD-10-CM | POA: Diagnosis not present

## 2022-03-05 DIAGNOSIS — I4891 Unspecified atrial fibrillation: Secondary | ICD-10-CM | POA: Diagnosis not present

## 2022-03-05 DIAGNOSIS — D692 Other nonthrombocytopenic purpura: Secondary | ICD-10-CM | POA: Diagnosis present

## 2022-03-05 LAB — ECHOCARDIOGRAM LIMITED
Height: 62 in
S' Lateral: 2.8 cm
Single Plane A4C EF: 47.3 %
Weight: 1806.4 oz

## 2022-03-05 LAB — CBC WITH DIFFERENTIAL/PLATELET
Abs Immature Granulocytes: 0.07 10*3/uL (ref 0.00–0.07)
Basophils Absolute: 0.1 10*3/uL (ref 0.0–0.1)
Basophils Relative: 0 %
Eosinophils Absolute: 0.1 10*3/uL (ref 0.0–0.5)
Eosinophils Relative: 0 %
HCT: 35.5 % — ABNORMAL LOW (ref 36.0–46.0)
Hemoglobin: 11.7 g/dL — ABNORMAL LOW (ref 12.0–15.0)
Immature Granulocytes: 1 %
Lymphocytes Relative: 22 %
Lymphs Abs: 3.4 10*3/uL (ref 0.7–4.0)
MCH: 29.8 pg (ref 26.0–34.0)
MCHC: 33 g/dL (ref 30.0–36.0)
MCV: 90.6 fL (ref 80.0–100.0)
Monocytes Absolute: 1 10*3/uL (ref 0.1–1.0)
Monocytes Relative: 6 %
Neutro Abs: 10.7 10*3/uL — ABNORMAL HIGH (ref 1.7–7.7)
Neutrophils Relative %: 71 %
Platelets: 340 10*3/uL (ref 150–400)
RBC: 3.92 MIL/uL (ref 3.87–5.11)
RDW: 13.5 % (ref 11.5–15.5)
WBC: 15.2 10*3/uL — ABNORMAL HIGH (ref 4.0–10.5)
nRBC: 0 % (ref 0.0–0.2)

## 2022-03-05 LAB — BASIC METABOLIC PANEL
Anion gap: 8 (ref 5–15)
BUN: 18 mg/dL (ref 8–23)
CO2: 26 mmol/L (ref 22–32)
Calcium: 8.4 mg/dL — ABNORMAL LOW (ref 8.9–10.3)
Chloride: 100 mmol/L (ref 98–111)
Creatinine, Ser: 0.85 mg/dL (ref 0.44–1.00)
GFR, Estimated: >60 mL/min (ref >60)
Glucose, Bld: 102 mg/dL — ABNORMAL HIGH (ref 70–99)
Potassium: 3.8 mmol/L (ref 3.5–5.1)
Sodium: 134 mmol/L — ABNORMAL LOW (ref 135–145)

## 2022-03-05 LAB — CBC
HCT: 31.6 % — ABNORMAL LOW (ref 36.0–46.0)
Hemoglobin: 10 g/dL — ABNORMAL LOW (ref 12.0–15.0)
MCH: 29.2 pg (ref 26.0–34.0)
MCHC: 31.6 g/dL (ref 30.0–36.0)
MCV: 92.1 fL (ref 80.0–100.0)
Platelets: 275 10*3/uL (ref 150–400)
RBC: 3.43 MIL/uL — ABNORMAL LOW (ref 3.87–5.11)
RDW: 13.6 % (ref 11.5–15.5)
WBC: 12.3 10*3/uL — ABNORMAL HIGH (ref 4.0–10.5)
nRBC: 0 % (ref 0.0–0.2)

## 2022-03-05 LAB — MAGNESIUM: Magnesium: 1.9 mg/dL (ref 1.7–2.4)

## 2022-03-05 LAB — SEDIMENTATION RATE: Sed Rate: 53 mm/hr — ABNORMAL HIGH (ref 0–22)

## 2022-03-05 MED ORDER — METOPROLOL TARTRATE 25 MG PO TABS
25.0000 mg | ORAL_TABLET | Freq: Four times a day (QID) | ORAL | Status: DC
  Administered 2022-03-05 – 2022-03-08 (×11): 25 mg via ORAL
  Filled 2022-03-05 (×11): qty 1

## 2022-03-05 MED ORDER — ALBUTEROL SULFATE HFA 108 (90 BASE) MCG/ACT IN AERS: 1.0000 | INHALATION_SPRAY | Freq: Four times a day (QID) | RESPIRATORY_TRACT | Status: DC | PRN

## 2022-03-05 MED ORDER — ALBUTEROL SULFATE HFA 108 (90 BASE) MCG/ACT IN AERS
1.0000 | INHALATION_SPRAY | Freq: Four times a day (QID) | RESPIRATORY_TRACT | Status: DC | PRN
  Administered 2022-03-05 – 2022-03-07 (×2): 1 via RESPIRATORY_TRACT

## 2022-03-05 MED ORDER — FUROSEMIDE 10 MG/ML IJ SOLN
20.0000 mg | Freq: Two times a day (BID) | INTRAMUSCULAR | Status: DC
  Administered 2022-03-05 – 2022-03-07 (×5): 20 mg via INTRAVENOUS
  Filled 2022-03-05 (×5): qty 2

## 2022-03-06 DIAGNOSIS — D692 Other nonthrombocytopenic purpura: Secondary | ICD-10-CM | POA: Diagnosis not present

## 2022-03-06 DIAGNOSIS — I5043 Acute on chronic combined systolic (congestive) and diastolic (congestive) heart failure: Secondary | ICD-10-CM

## 2022-03-06 DIAGNOSIS — I4891 Unspecified atrial fibrillation: Secondary | ICD-10-CM | POA: Diagnosis not present

## 2022-03-06 LAB — CBC
HCT: 31.2 % — ABNORMAL LOW (ref 36.0–46.0)
Hemoglobin: 10.4 g/dL — ABNORMAL LOW (ref 12.0–15.0)
MCH: 30 pg (ref 26.0–34.0)
MCHC: 33.3 g/dL (ref 30.0–36.0)
MCV: 89.9 fL (ref 80.0–100.0)
Platelets: 295 10*3/uL (ref 150–400)
RBC: 3.47 MIL/uL — ABNORMAL LOW (ref 3.87–5.11)
RDW: 13.3 % (ref 11.5–15.5)
WBC: 12.7 10*3/uL — ABNORMAL HIGH (ref 4.0–10.5)
nRBC: 0 % (ref 0.0–0.2)

## 2022-03-06 LAB — BASIC METABOLIC PANEL
Anion gap: 8 (ref 5–15)
BUN: 18 mg/dL (ref 8–23)
CO2: 28 mmol/L (ref 22–32)
Calcium: 8.1 mg/dL — ABNORMAL LOW (ref 8.9–10.3)
Chloride: 97 mmol/L — ABNORMAL LOW (ref 98–111)
Creatinine, Ser: 0.88 mg/dL (ref 0.44–1.00)
GFR, Estimated: >60 mL/min (ref >60)
Glucose, Bld: 103 mg/dL — ABNORMAL HIGH (ref 70–99)
Potassium: 3.2 mmol/L — ABNORMAL LOW (ref 3.5–5.1)
Sodium: 133 mmol/L — ABNORMAL LOW (ref 135–145)

## 2022-03-06 LAB — MAGNESIUM: Magnesium: 1.8 mg/dL (ref 1.7–2.4)

## 2022-03-06 MED ORDER — POTASSIUM CHLORIDE CRYS ER 20 MEQ PO TBCR
20.0000 meq | EXTENDED_RELEASE_TABLET | Freq: Two times a day (BID) | ORAL | Status: DC
  Administered 2022-03-06 – 2022-03-08 (×5): 20 meq via ORAL
  Filled 2022-03-06 (×5): qty 1

## 2022-03-06 MED ORDER — POTASSIUM CHLORIDE CRYS ER 20 MEQ PO TBCR
20.0000 meq | EXTENDED_RELEASE_TABLET | Freq: Once | ORAL | Status: AC
Start: 2022-03-06 — End: 2022-03-06
  Administered 2022-03-06: 20 meq via ORAL
  Filled 2022-03-06: qty 1

## 2022-03-06 MED ORDER — DIGOXIN 0.25 MG/ML IJ SOLN
0.2500 mg | Freq: Once | INTRAMUSCULAR | Status: AC
  Administered 2022-03-06: 0.25 mg via INTRAVENOUS
  Filled 2022-03-06: qty 1

## 2022-03-06 MED ORDER — DABIGATRAN ETEXILATE MESYLATE 150 MG PO CAPS
150.0000 mg | ORAL_CAPSULE | Freq: Two times a day (BID) | ORAL | Status: DC
  Administered 2022-03-06 – 2022-03-09 (×7): 150 mg via ORAL
  Filled 2022-03-06 (×8): qty 1

## 2022-03-07 ENCOUNTER — Encounter (HOSPITAL_COMMUNITY): Payer: Self-pay | Admitting: Internal Medicine

## 2022-03-07 ENCOUNTER — Other Ambulatory Visit (HOSPITAL_COMMUNITY): Payer: Self-pay

## 2022-03-07 ENCOUNTER — Other Ambulatory Visit: Payer: Self-pay

## 2022-03-07 DIAGNOSIS — I4891 Unspecified atrial fibrillation: Secondary | ICD-10-CM | POA: Diagnosis not present

## 2022-03-07 DIAGNOSIS — D692 Other nonthrombocytopenic purpura: Secondary | ICD-10-CM | POA: Diagnosis not present

## 2022-03-07 LAB — BASIC METABOLIC PANEL
Anion gap: 11 (ref 5–15)
BUN: 14 mg/dL (ref 8–23)
CO2: 27 mmol/L (ref 22–32)
Calcium: 8.4 mg/dL — ABNORMAL LOW (ref 8.9–10.3)
Chloride: 94 mmol/L — ABNORMAL LOW (ref 98–111)
Creatinine, Ser: 0.77 mg/dL (ref 0.44–1.00)
GFR, Estimated: >60 mL/min (ref >60)
Glucose, Bld: 111 mg/dL — ABNORMAL HIGH (ref 70–99)
Potassium: 3.7 mmol/L (ref 3.5–5.1)
Sodium: 132 mmol/L — ABNORMAL LOW (ref 135–145)

## 2022-03-07 LAB — CBC
HCT: 34.2 % — ABNORMAL LOW (ref 36.0–46.0)
Hemoglobin: 11.2 g/dL — ABNORMAL LOW (ref 12.0–15.0)
MCH: 30 pg (ref 26.0–34.0)
MCHC: 32.7 g/dL (ref 30.0–36.0)
MCV: 91.7 fL (ref 80.0–100.0)
Platelets: 336 10*3/uL (ref 150–400)
RBC: 3.73 MIL/uL — ABNORMAL LOW (ref 3.87–5.11)
RDW: 13.4 % (ref 11.5–15.5)
WBC: 13.3 10*3/uL — ABNORMAL HIGH (ref 4.0–10.5)
nRBC: 0 % (ref 0.0–0.2)

## 2022-03-07 LAB — MAGNESIUM: Magnesium: 1.8 mg/dL (ref 1.7–2.4)

## 2022-03-07 MED ORDER — SODIUM CHLORIDE 0.9 % IV SOLN
INTRAVENOUS | Status: DC
Start: 2022-03-07 — End: 2022-03-08

## 2022-03-07 MED ORDER — OFF THE BEAT BOOK
Freq: Once | Status: AC
Start: 2022-03-07 — End: 2022-03-07
  Filled 2022-03-07: qty 1

## 2022-03-07 MED ORDER — DIGOXIN 0.25 MG/ML IJ SOLN
0.1250 mg | Freq: Once | INTRAMUSCULAR | Status: AC
  Administered 2022-03-07: 0.125 mg via INTRAVENOUS
  Filled 2022-03-07 (×2): qty 0.5

## 2022-03-07 NOTE — Care Management Important Message (Signed)
Important Message  Patient Details  Name: SHAKEARA FONT MRN: [id number] Date of Birth: [birth year]   Medicare Important Message Given:  Yes     Dorena Bodo 03/07/2022, 4:44 PM

## 2022-03-07 NOTE — Progress Notes (Addendum)
Progress Note  Patient Name: [name] Rice Date of Encounter: 03/07/2022  [facility] Cardiologist: Carolan Clines, MD  Subjective   New onset atrial fibrillation. Her CHA2DS2-VASc score is 3.  She has been on Eliquis 2.5 mg twice a day.  She had cardioversion in March, 2023 which was unsuccessful. She did eventually convert to sinus rhythm but then went into atrial fibrillation with variable AV block.  She is admitted for decompensated heart failure. She developed purpura, platelet count has been normal.  Echo from November 10, 2021 reveals normal left ventricular systolic function.  The diastolic function could not be evaluated.  She has mild to moderate pulmonary hypertension with an estimated PA pressure 41 mmHg.  Left atrium is moderately dilated.  Mild to moderate tricuspid regurgitation.  Echo 6/23 shows EF 50-55% ( difficult to assess LVEF due to rapid AFib)  Has moderate pulmonary HTN - est. PA pressure of 46 mmHg.  Severe MR   She is euvolemic today. She is asymptomatic. Rate are better controlled.   Inpatient Medications    Scheduled Meds:  brinzolamide  1 drop Right Eye BID   dabigatran  150 mg Oral Q12H   furosemide  20 mg Intravenous BID   latanoprost  1 drop Right Eye QHS   metoprolol tartrate  25 mg Oral Q6H   potassium chloride  20 mEq Oral BID   Continuous Infusions:  PRN Meds: acetaminophen, albuterol, albuterol, dextromethorphan, nitroGLYCERIN, ondansetron (ZOFRAN) IV, sodium chloride HYPERTONIC   Vital Signs    Vitals:   03/06/22 1721 03/06/22 1922 03/07/22 0025 03/07/22 0441  BP: 104/64 105/75 111/76 118/80  Pulse: (!) 109 (!) 120 98 86  Resp: 19 20    Temp: 98.5 F (36.9 C) 98.5 F (36.9 C) 98.3 F (36.8 C) 97.8 F (36.6 C)  TempSrc: Oral Oral Oral Oral  SpO2: 97% 96% 91% 99%  Weight:    49 kg  Height:        Intake/Output Summary (Last 24 hours) at 03/07/2022 0742 Last data filed at 03/06/2022 2142 Gross per 24 hour  Intake 120 ml   Output 925 ml  Net -805 ml      03/07/2022    4:41 AM 03/06/2022    5:49 AM 03/05/2022    5:00 AM  Last 3 Weights  Weight (lbs) 108 lb 0.4 oz 110 lb 3.2 oz 112 lb 14.4 oz  Weight (kg) 49 kg 49.986 kg 51.211 kg      Telemetry    Atrial fib  rates 100s- Personally Reviewed  ECG    NA  Physical Exam   Physical Exam: Blood pressure 118/80, pulse 86, temperature 97.8 F (36.6 C), temperature source Oral, resp. rate 20, height 5\' 2"  (1.575 m), weight 49 kg, SpO2 99 %.  GEN:  elderly , thin, somewhat frail female ,  breathing better  HEENT: Normal NECK: No JVD; No carotid bruits LYMPHATICS: No lymphadenopathy CARDIAC: Irreg. Irreg.  No significant MR murmur  RESPIRATORY:  Clear to auscultation without rales, wheezing or rhonchi  ABDOMEN: Soft, non-tender, non-distended MUSCULOSKELETAL:  No edema; No deformity  SKIN: purporic rash improved, less erythematous NEUROLOGIC:  Alert and oriented x 3   Labs    High Sensitivity Troponin:  No results for input(s): "TROPONINIHS" in the last 720 hours.   Chemistry Recent Labs  Lab 03/05/22 0437 03/06/22 0142 03/07/22 0127  NA 134* 133* 132*  K 3.8 3.2* 3.7  CL 100 97* 94*  CO2 26 28 27  GLUCOSE 102* 103* 111*  BUN 18 18 14   CREATININE 0.85 0.88 0.77  CALCIUM 8.4* 8.1* 8.4*  MG 1.9 1.8 1.8  GFRNONAA >60 >60 >60  ANIONGAP 8 8 11     Lipids No results for input(s): "CHOL", "TRIG", "HDL", "LABVLDL", "LDLCALC", "CHOLHDL" in the last 168 hours.  Hematology Recent Labs  Lab 03/05/22 1811 03/06/22 0142 03/07/22 0127  WBC 15.2* 12.7* 13.3*  RBC 3.92 3.47* 3.73*  HGB 11.7* 10.4* 11.2*  HCT 35.5* 31.2* 34.2*  MCV 90.6 89.9 91.7  MCH 29.8 30.0 30.0  MCHC 33.0 33.3 32.7  RDW 13.5 13.3 13.4  PLT 340 295 336   Thyroid No results for input(s): "TSH", "FREET4" in the last 168 hours.  BNP Recent Labs  Lab 03/04/22 1515  BNP 489.3*    DDimer No results for input(s): "DDIMER" in the last 168 hours.   Radiology     ECHOCARDIOGRAM LIMITED  Result Date: 03/05/2022    ECHOCARDIOGRAM LIMITED REPORT   Patient Name:   [name] Rice Date of Exam: 03/05/2022 Medical Rec #:  [id number]        Height:       62.0 in Accession #:    [id number]       Weight:       112.9 lb Date of Birth:  03-24-1941         BSA:          1.499 m Patient Age:    81 years         BP:           100/68 mmHg Patient Gender: F                HR:           110 bpm. Exam Location:  Inpatient Procedure: Limited Echo and Limited Color Doppler Indications:    I50.31 Acute diastolic (congestive) heart failure  History:        Patient has prior history of Echocardiogram examinations, most                 recent 11/10/2021. Arrythmias:Atrial Fibrillation; Risk                 Factors:Former Smoker. History of left breast cancer.  Sonographer:    Dondra Prader RVT Referring Phys: [id number] Corrin Parker  Sonographer Comments: Technically difficult study due to poor echo windows. IMPRESSIONS  1. Left ventricular ejection fraction, by estimation, is 50 to 55%. The left ventricle has low normal function.  2. RV / RA gradient estimaged to be 31 mmHg. Dilated IVC - RA pressure estimated at 15 mm Hg.     Estimaged PA pressure of 46 mmHg. Marland Kitchen There is moderately elevated pulmonary artery systolic pressure.  3. Left atrial size was mildly dilated.  4. Moderate to severe mitral valve regurgitation.  5. Tricuspid valve regurgitation is moderate.  6. The NCC is heavily calcified . The aortic valve is tricuspid. There is mild calcification of the aortic valve. Comparison(s): 11/10/21-EF 60-65%. FINDINGS  Left Ventricle: Left ventricular ejection fraction, by estimation, is 50 to 55%. The left ventricle has low normal function. Right Ventricle: RV / RA gradient estimaged to be 31 mmHg. Dilated IVC - RA pressure estimated at 15 mm Hg. Estimaged PA pressure of 46 mmHg. There is moderately elevated pulmonary artery systolic pressure. Left Atrium: Left atrial size was mildly dilated. Right  Atrium: Right atrial size was normal in size. Mitral Valve: Moderate to severe  mitral valve regurgitation. Tricuspid Valve: Tricuspid valve regurgitation is moderate. Aortic Valve: The NCC is heavily calcified. The aortic valve is tricuspid. There is mild calcification of the aortic valve. Aorta: The aortic root and ascending aorta are structurally normal, with no evidence of dilitation. IAS/Shunts: The atrial septum is grossly normal. LEFT VENTRICLE PLAX 2D LVIDd:         3.90 cm LVIDs:         2.80 cm LV PW:         0.80 cm LV IVS:        0.75 cm LVOT diam:     1.90 cm LVOT Area:     2.84 cm  LV Volumes (MOD) LV vol d, MOD A4C: 58.6 ml LV vol s, MOD A4C: 30.9 ml LV SV MOD A4C:     58.6 ml IVC IVC diam: 2.30 cm LEFT ATRIUM           Index        RIGHT ATRIUM           Index LA diam:      4.00 cm 2.67 cm/m   RA Area:     15.70 cm LA Vol (A2C): 25.8 ml 17.21 ml/m  RA Volume:   43.20 ml  28.81 ml/m LA Vol (A4C): 55.6 ml 37.09 ml/m   AORTA Ao Root diam: 2.80 cm Ao Asc diam:  3.30 cm  SHUNTS Systemic Diam: 1.90 cm Kristeen Miss MD Electronically signed by Kristeen Miss MD Signature Date/Time: 03/05/2022/1:21:45 PM    Final     Cardiac Studies  TTE 03/05/2022 1. Left ventricular ejection fraction, by estimation, is 50 to 55%. The  left ventricle has low normal function.   2. RV / RA gradient estimaged to be 31 mmHg. Dilated IVC - RA pressure  estimated at 15 mm Hg.      Estimaged PA pressure of 46 mmHg. Marland Kitchen There is moderately elevated  pulmonary artery systolic pressure.   3. Left atrial size was mildly dilated.   4. Moderate to severe mitral valve regurgitation.   5. Tricuspid valve regurgitation is moderate.   6. The NCC is heavily calcified . The aortic valve is tricuspid. There is  mild calcification of the aortic valve.     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 Vassie Loll, left breast cancer s/p lumpectomy with radiation and chemotherapy, and  paroxysmal atrial fibrillation on Eliquis patient diagnosed 11/11/2021 presented as a direct admission from cardiology office for fibrillation/flutter with RVR with increasing lower extremity edema concerning for decompensated heart failure with BNP elevated at 489.3.  Assessment & Plan    1. Persistent Atrial fibrillation: She continues to atrial fibrillation with rapid ventricular response.  She has extensive  bronchiectasis.  Will give IV digoxin 0.125 mg 6/26 to complete digoxin load,  then oral digoxin 0.065 mg daily. Will plan for DCCV tomorrow (she has not missed any AC doses). Will plan for Afib clinic follow up (consider tikosyn if recurrence; aiming to avoid amio with significant bronchiectasis)  2. Diastolic Heart Failure: NYHA Class III. Had mild VO. She has normal left ventricular systolic function by echo in March, 2023. She also has moderate pulmonary hypertension which is likely due to her underlying lung disease as well as her moderate to severe MR. EF mildly low. She has done well with diuresis. Her crt is stable. She is euvolemic. Will continue IV lasix today and transition to oral  tomorrow.  3.  Purpuric skin lesions:Unclear etiology. Normal platelet count. Diltiazem was stopped. Lasix was stopped then restarted. Her rash is improving. Her eliquis was switched to pradaxa as a possible cause. She has an appointment with rheumatology on 7/10. No steroids at this time  If DCCV is successful and/or her rates are better controlled on digoxin,she'll be ready from a cardiac standpoint for DC tomorrow.   Shared Decision Making/Informed Consent The risks (stroke, cardiac arrhythmias rarely resulting in the need for a temporary or permanent pacemaker, skin irritation or burns and complications associated with conscious sedation including aspiration, arrhythmia, respiratory failure and death), benefits (restoration of normal sinus rhythm) and alternatives of a direct current cardioversion were  explained in detail to Ms. Bedoya and she agrees to proceed.     For questions or updates, please contact [facility] Please consult [email or web address] for contact info under        Signed, Maisie Fus, MD  03/07/2022, 7:42 AM

## 2022-03-07 NOTE — Progress Notes (Signed)
Monitor alarming for HR 120-140bpm.  BP 103/61.  Patient denies symptoms. Given scheduled po lopressor and PA Bhagat notified. Will continue to monitor.

## 2022-03-07 NOTE — Progress Notes (Signed)
Consultant Progress Note  Patient: [name] Rice UVO:[id number] DOB: [birth year]  DOA: 03/04/2022  DOS: 03/07/2022    Subjective: Painful eruption on legs initially at ankles and grew and spread over months, though seems to be stabilizing, pain is diminishing. She denies fever/chills, oral ulcers/painful swallowing or dysuria or blood per rectum. No palpitations, chest pain or dyspnea. Cough is roughly at her baseline.  Objective: BP 103/61 (BP Location: Left Arm)   Pulse (!) 108   Temp 98.2 F (36.8 C) (Oral)   Resp 18   Ht 5\' 2"  (1.575 m)   Wt 49 kg   SpO2 97%   BMI 19.76 kg/m   Gen: Pleasant, elderly female in no distress Pulm: Nonlabored breathing room air. Clear CV: Irreg irreg w/rate in low 100's without murmur, rub, or gallop. No JVD, trace (improved) dependent edema. GI: Abdomen soft, non-tender, non-distended, with normoactive bowel sounds.  Ext: No joint redness, swelling, tenderness in extremities.  Skin: Lesions as pictured below. Ulcerated/unroofed blisters are tender, though other lesions/most are not. Left hypothenar biopsy site noted. Noted no ulcers in oropharynx. No central clearing to lesions. No livedo reticularis. Rash spares soles of feet and proximal to wrist and knees. No dependent eruption on back/sacrum. No exudate, fluctuance, induration, or odor.            WBC 15.3 (6/23) >> 13.3k (6/26) Hgb 10.5g/dl >> 74.2V/ZD Plt 638V >> 564P  Na 329 > 132 K 3.2 > 3.7 SCr 0.95 > 0.77 Mg 1.9 > 1.8.  Glucose 102-128  Labs 02/25/2022: Rheumatoid Factor 50.6 (nl < 14) CRP    44 (ULN is 10) ESR    45 (was 28 on 5/22, 33 on 5/3) CMP largely unremarkable (Na 135, Ca 8.6, Cr 0.66) WBC 7.6k (PMNs 4.7, Eo's 0.1), hgb 11.0, plt 316k Blood cultures and urine culture no growth  Labs 01/12/2022:  C3 114 (82-167), C4 15 (12-38) cANCA, pANCA, ANA: Negative. Hep B sAg, and Ab negative.  CRP 30  Labs 01/10/2022:  PT/INR, platelet count: WNL.  Skin biopsy  6/19: Neutrophilic dermatosis  Assessment and Plan: Persistent atrial fibrillation with rapid ventricular response: Rates intermittently elevated, though trend improving with beta blocker and addition of digoxin.  - Continue metoprolol tartrate 25mg  q6h - Continue anticoagulation with dabigatran since blood counts are stable and CHA2DS2-VASc score is 59 (age x2, sex)  - DCCV planned 6/27. - Note QTc not prolonged, could consider dofetilide. With severity of parenchymal lung disease, amiodarone is not an ideal long term option.  - Check TSH - Keep K and Mg replete   Acute on chronic HFpEF, moderate MR: EDW felt to be ~51kg based on outpatient weights when euvolemic. Presented overloaded with wt 52.1kg, now down to 49kg. Down 2.3L then 1.3L over previous 24 hour periods. Cr stable.  - Continue IV lasix today, transition to po in AM.   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 Charlton Haws. Ulcerating lesions become painful after gradual enlargement and coalescence. Nonpruritic. ITP, TTP, DIC, anti-phospholipid syndrome unlikely. Platelet count and coagulation studies are normal. Unclear precipitant in this case, though inflammatory markers including RF are elevated and there is +FH of autoimmune disorders. Oddly despite hx +ANA, is negative on last check. However, would be odd to manifest so late in life and has no hx of same, no arthralgias, no typical Sjogren-type symptoms, or ocular involvement. Does not seem to be complement-mediated. Cryoglobulinemia less like, hepatitis panel negative. Despite leukocytosis, remaining afebrile  with no Osler nodes, Janeway lesions, and negative blood cultures overall argues against infectious embolic phenomenon.  - Keep follow up appointment with rheumatology, Dr. Kathi Ludwig, on 7/10. Unfortunately, no consultative services are available at this hospital.  - Given diagnostic uncertainty, fortunately improving clinical course,  and desire not to obscure yield of repeat biopsy, recommend that we do not start steroids at this time.  - Removed possible precipitant medications (eliquis, diltiazem).  Microscopic hematuria: Unclear etiology. >20/HPF on UA.  - CT abd/pelvis can be considered if persistent given her (admittedly mild and remote) tobacco use. - If no anatomic findings, ?if this would constitute mucosal involvement of cutaneous process.   History of MAC s/p 6 months Tx and resultant extensive bronchiectasis: AFB Cx, smear negative July 2022. CXR 6/23 showed RUL scarring without consolidation or pulmonary edema. No pulmonary complaints at this time. - Has follow up with Dr. Vassie Loll 7/14.  - Continue delsym, prn albuterol.   History of left breast CA s/p XRT, chemotherapy:   Advanced age: Pt presented from [facility].  - PT/OT consulted to inform safest venue at disposition.  Glaucoma: Quiescent.  - Continue gtt's  Hypokalemia: Improved.  - Continue supplementation with Afib and ongoing loop diuretic.   Tyrone Nine, MD 03/07/2022 6:39 PM Page by [email or web address]

## 2022-03-08 ENCOUNTER — Other Ambulatory Visit: Payer: Self-pay | Admitting: Physician Assistant

## 2022-03-08 ENCOUNTER — Inpatient Hospital Stay (HOSPITAL_COMMUNITY): Payer: Medicare Other | Admitting: Certified Registered Nurse Anesthetist

## 2022-03-08 ENCOUNTER — Inpatient Hospital Stay (HOSPITAL_COMMUNITY): Payer: Medicare Other

## 2022-03-08 ENCOUNTER — Encounter (HOSPITAL_COMMUNITY)
Admission: RE | Disposition: A | Discharge status: Home Health | Payer: Self-pay | Source: Home / Self Care | Attending: Internal Medicine

## 2022-03-08 DIAGNOSIS — I4891 Unspecified atrial fibrillation: Secondary | ICD-10-CM

## 2022-03-08 DIAGNOSIS — Z79899 Other long term (current) drug therapy: Secondary | ICD-10-CM

## 2022-03-08 DIAGNOSIS — I4819 Other persistent atrial fibrillation: Principal | ICD-10-CM

## 2022-03-08 DIAGNOSIS — I5030 Unspecified diastolic (congestive) heart failure: Secondary | ICD-10-CM | POA: Diagnosis not present

## 2022-03-08 DIAGNOSIS — G709 Myoneural disorder, unspecified: Secondary | ICD-10-CM | POA: Diagnosis not present

## 2022-03-08 DIAGNOSIS — I10 Essential (primary) hypertension: Secondary | ICD-10-CM | POA: Diagnosis not present

## 2022-03-08 DIAGNOSIS — D692 Other nonthrombocytopenic purpura: Secondary | ICD-10-CM | POA: Diagnosis not present

## 2022-03-08 DIAGNOSIS — C50919 Malignant neoplasm of unspecified site of unspecified female breast: Secondary | ICD-10-CM | POA: Diagnosis not present

## 2022-03-08 HISTORY — PX: CARDIOVERSION: SHX1299

## 2022-03-08 LAB — BASIC METABOLIC PANEL
Anion gap: 11 (ref 5–15)
BUN: 15 mg/dL (ref 8–23)
CO2: 24 mmol/L (ref 22–32)
Calcium: 8.4 mg/dL — ABNORMAL LOW (ref 8.9–10.3)
Chloride: 99 mmol/L (ref 98–111)
Creatinine, Ser: 0.72 mg/dL (ref 0.44–1.00)
GFR, Estimated: >60 mL/min (ref >60)
Glucose, Bld: 108 mg/dL — ABNORMAL HIGH (ref 70–99)
Potassium: 4 mmol/L (ref 3.5–5.1)
Sodium: 134 mmol/L — ABNORMAL LOW (ref 135–145)

## 2022-03-08 LAB — TSH: TSH: 1.655 u[IU]/mL (ref 0.350–4.500)

## 2022-03-08 LAB — MAGNESIUM: Magnesium: 1.8 mg/dL (ref 1.7–2.4)

## 2022-03-08 SURGERY — CARDIOVERSION
Anesthesia: General

## 2022-03-08 MED ORDER — MAGNESIUM SULFATE 2 GM/50ML IV SOLN
2.0000 g | Freq: Once | INTRAVENOUS | Status: AC
  Administered 2022-03-08: 2 g via INTRAVENOUS
  Filled 2022-03-08: qty 50

## 2022-03-08 MED ORDER — FUROSEMIDE 40 MG PO TABS: 40.0000 mg | ORAL_TABLET | Freq: Every day | ORAL | Status: DC

## 2022-03-08 MED ORDER — FUROSEMIDE 20 MG PO TABS
20.0000 mg | ORAL_TABLET | Freq: Every day | ORAL | Status: DC
  Administered 2022-03-08 – 2022-03-09 (×2): 20 mg via ORAL
  Filled 2022-03-08 (×2): qty 1

## 2022-03-08 MED ORDER — POTASSIUM CHLORIDE 20 MEQ PO PACK
20.0000 meq | PACK | Freq: Every day | ORAL | Status: DC
  Administered 2022-03-09: 20 meq via ORAL
  Filled 2022-03-08: qty 1

## 2022-03-08 MED ORDER — POTASSIUM CHLORIDE CRYS ER 20 MEQ PO TBCR: 20.0000 meq | EXTENDED_RELEASE_TABLET | Freq: Every day | ORAL | Status: DC

## 2022-03-08 MED ORDER — LIDOCAINE 2% (20 MG/ML) 5 ML SYRINGE
INTRAMUSCULAR | Status: DC | PRN
  Administered 2022-03-08: 60 mg via INTRAVENOUS

## 2022-03-08 MED ORDER — DIGOXIN 125 MCG PO TABS
0.0625 mg | ORAL_TABLET | Freq: Every day | ORAL | Status: DC
  Administered 2022-03-08 – 2022-03-09 (×2): 0.0625 mg via ORAL
  Filled 2022-03-08 (×2): qty 1

## 2022-03-08 MED ORDER — PROPOFOL 10 MG/ML IV BOLUS
INTRAVENOUS | Status: DC | PRN
  Administered 2022-03-08: 50 mg via INTRAVENOUS

## 2022-03-08 MED ORDER — METOPROLOL SUCCINATE ER 50 MG PO TB24
50.0000 mg | ORAL_TABLET | Freq: Every day | ORAL | Status: DC
Start: 2022-03-08 — End: 2022-03-09
  Administered 2022-03-08 – 2022-03-09 (×2): 50 mg via ORAL
  Filled 2022-03-08 (×2): qty 1

## 2022-03-08 NOTE — Progress Notes (Signed)
Gave pt blood thinner for procedure today. See MAR. Verified medication with Gearldine Bienenstock RN before giving med to pt.

## 2022-03-08 NOTE — Anesthesia Procedure Notes (Signed)
Procedure Name: MAC Date/Time: 03/08/2022 7:44 AM  Performed by: Zollie Beckers, CRNAPre-anesthesia Checklist: Patient identified, Emergency Drugs available, Suction available and Patient being monitored Patient Re-evaluated:Patient Re-evaluated prior to induction Oxygen Delivery Method: Ambu bag

## 2022-03-09 ENCOUNTER — Other Ambulatory Visit (HOSPITAL_COMMUNITY): Payer: Self-pay

## 2022-03-09 DIAGNOSIS — I5030 Unspecified diastolic (congestive) heart failure: Secondary | ICD-10-CM | POA: Diagnosis not present

## 2022-03-09 DIAGNOSIS — D692 Other nonthrombocytopenic purpura: Secondary | ICD-10-CM | POA: Diagnosis not present

## 2022-03-09 DIAGNOSIS — I4819 Other persistent atrial fibrillation: Secondary | ICD-10-CM | POA: Diagnosis not present

## 2022-03-09 DIAGNOSIS — I4891 Unspecified atrial fibrillation: Secondary | ICD-10-CM | POA: Diagnosis not present

## 2022-03-09 LAB — CBC WITH DIFFERENTIAL/PLATELET
Abs Immature Granulocytes: 0.06 10*3/uL (ref 0.00–0.07)
Basophils Absolute: 0 10*3/uL (ref 0.0–0.1)
Basophils Relative: 0 %
Eosinophils Absolute: 0.1 10*3/uL (ref 0.0–0.5)
Eosinophils Relative: 1 %
HCT: 30.9 % — ABNORMAL LOW (ref 36.0–46.0)
Hemoglobin: 10.3 g/dL — ABNORMAL LOW (ref 12.0–15.0)
Immature Granulocytes: 0 %
Lymphocytes Relative: 19 %
Lymphs Abs: 2.6 10*3/uL (ref 0.7–4.0)
MCH: 30.3 pg (ref 26.0–34.0)
MCHC: 33.3 g/dL (ref 30.0–36.0)
MCV: 90.9 fL (ref 80.0–100.0)
Monocytes Absolute: 1.3 10*3/uL — ABNORMAL HIGH (ref 0.1–1.0)
Monocytes Relative: 9 %
Neutro Abs: 10 10*3/uL — ABNORMAL HIGH (ref 1.7–7.7)
Neutrophils Relative %: 71 %
Platelets: 326 10*3/uL (ref 150–400)
RBC: 3.4 MIL/uL — ABNORMAL LOW (ref 3.87–5.11)
RDW: 13.4 % (ref 11.5–15.5)
WBC: 14.1 10*3/uL — ABNORMAL HIGH (ref 4.0–10.5)
nRBC: 0 % (ref 0.0–0.2)

## 2022-03-09 LAB — BASIC METABOLIC PANEL
Anion gap: 7 (ref 5–15)
BUN: 20 mg/dL (ref 8–23)
CO2: 26 mmol/L (ref 22–32)
Calcium: 8.1 mg/dL — ABNORMAL LOW (ref 8.9–10.3)
Chloride: 101 mmol/L (ref 98–111)
Creatinine, Ser: 0.67 mg/dL (ref 0.44–1.00)
GFR, Estimated: >60 mL/min (ref >60)
Glucose, Bld: 104 mg/dL — ABNORMAL HIGH (ref 70–99)
Potassium: 3.6 mmol/L (ref 3.5–5.1)
Sodium: 134 mmol/L — ABNORMAL LOW (ref 135–145)

## 2022-03-09 MED ORDER — FUROSEMIDE 20 MG PO TABS
20.0000 mg | ORAL_TABLET | Freq: Every day | ORAL | 1 refills | Status: DC
  Filled 2022-03-09: qty 30, 30d supply, fill #0

## 2022-03-09 MED ORDER — POTASSIUM CHLORIDE 20 MEQ/15ML (10%) PO SOLN
20.0000 meq | Freq: Every day | ORAL | 1 refills | Status: DC
  Filled 2022-03-09: qty 450, 30d supply, fill #0

## 2022-03-09 MED ORDER — DIGOXIN 125 MCG PO TABS
0.0625 mg | ORAL_TABLET | Freq: Every day | ORAL | 1 refills | Status: DC
  Filled 2022-03-09: qty 15, 30d supply, fill #0

## 2022-03-09 MED ORDER — DABIGATRAN ETEXILATE MESYLATE 150 MG PO CAPS
150.0000 mg | ORAL_CAPSULE | Freq: Two times a day (BID) | ORAL | 2 refills | Status: DC
  Filled 2022-03-09: qty 60, 30d supply, fill #0

## 2022-03-09 NOTE — Evaluation (Signed)
Occupational Therapy Evaluation Patient Details Name: [name] Rice MRN: [id number] DOB: [birth year] Today's Date: 03/09/2022   History of Present Illness Pt adm 6/23 with persisten afib/flutter with RVR and acute on chronic diastolic heart failure. Pt also with purpuric skin lesions which were being worked up by dermatology and rheumatology as outpatient. Pt underwent cardioversion on 6/27.  PMH - afib, breast CA, bronchiectasis.   Clinical Impression   PTA patient independent and driving, living at [facility]. Admitted for above and presents with decreased activity tolerance and generalized weakness, mild instability.  She completes ADLs with supervision, mobility with supervision and improving tolerance.  Plans to dc home today, discussed energy conservation techniques.  Will follow acutely but anticipate no further needs after dc home.      Recommendations for follow up therapy are one component of a multi-disciplinary discharge planning process, led by the attending physician.  Recommendations may be updated based on patient status, additional functional criteria and insurance authorization.   Follow Up Recommendations  No OT follow up    Assistance Recommended at Discharge Intermittent Supervision/Assistance  Patient can return home with the following Assistance with cooking/housework;Assist for transportation;Help with stairs or ramp for entrance    Functional Status Assessment     Equipment Recommendations  None recommended by OT    Recommendations for Other Services       Precautions / Restrictions Precautions Precautions: Fall Restrictions Weight Bearing Restrictions: No      Mobility Bed Mobility Overal bed mobility: Modified Independent                  Transfers                          Balance Overall balance assessment: Mild deficits observed, not formally tested                                         ADL either  performed or assessed with clinical judgement   ADL Overall ADL's : Needs assistance/impaired     Grooming: Supervision/safety;Standing           Upper Body Dressing : Supervision/safety;Standing   Lower Body Dressing: Supervision/safety;Sit to/from stand   Toilet Transfer: Supervision/safety;Ambulation   Toileting- Clothing Manipulation and Hygiene: Modified independent;Sit to/from stand   Tub/ Shower Transfer: Supervision/safety;Walk-in shower;Ambulation Tub/Shower Transfer Details (indicate cue type and reason): simulated in room Functional mobility during ADLs: Supervision/safety       Vision Patient Visual Report: Other (comment) (hx of cataracts) Vision Assessment?: No apparent visual deficits     Perception     Praxis      Pertinent Vitals/Pain Pain Assessment Pain Assessment: Faces Faces Pain Scale: Hurts a little bit Pain Location: bilateral feet/ankles from purpuric skin lesions Pain Descriptors / Indicators: Grimacing, Sore Pain Intervention(s): Limited activity within patient's tolerance, Monitored during session, Repositioned     Hand Dominance     Extremity/Trunk Assessment Upper Extremity Assessment Upper Extremity Assessment: Generalized weakness   Lower Extremity Assessment Lower Extremity Assessment: Defer to PT evaluation       Communication Communication Communication: No difficulties   Cognition Arousal/Alertness: Awake/alert Behavior During Therapy: WFL for tasks assessed/performed Overall Cognitive Status: Within Functional Limits for tasks assessed  General Comments  HR stable in 80s    Exercises     Shoulder Instructions      Home Living Family/patient expects to be discharged to:: Other (Comment)                                 Additional Comments: Pt has been at [facility] in independent living. Her husband has been moved to ALF when she was  admitted.      Prior Functioning/Environment Prior Level of Function : Independent/Modified Independent;Driving             Mobility Comments: No assistive device but activity tolerance has been decreasing ADLs Comments: independent ADLs, IADLs (light)        OT Problem List: Decreased strength      OT Treatment/Interventions: Self-care/ADL training;Therapeutic exercise;DME and/or AE instruction;Therapeutic activities;Patient/family education;Balance training    OT Goals(Current goals can be found in the care plan section) Acute Rehab OT Goals Patient Stated Goal: home OT Goal Formulation: With patient Time For Goal Achievement: 03/23/22 Potential to Achieve Goals: Good  OT Frequency: Min 2X/week    Co-evaluation              AM-PAC OT "6 Clicks" Daily Activity     Outcome Measure Help from another person eating meals?: None Help from another person taking care of personal grooming?: A Little Help from another person toileting, which includes using toliet, bedpan, or urinal?: A Little Help from another person bathing (including washing, rinsing, drying)?: A Little Help from another person to put on and taking off regular upper body clothing?: A Little Help from another person to put on and taking off regular lower body clothing?: A Little 6 Click Score: 19   End of Session Nurse Communication: Mobility status  Activity Tolerance: Patient tolerated treatment well Patient left: in bed;with call bell/phone within reach  OT Visit Diagnosis: Muscle weakness (generalized) (M62.81)                Time: 9741-6384 OT Time Calculation (min): 28 min Charges:  OT General Charges $OT Visit: 1 Visit OT Evaluation $OT Eval Low Complexity: 1 Low OT Treatments $Self Care/Home Management : 8-22 mins  Jolaine Artist, OT Acute Rehabilitation Services Office [phone]   [name] Rice 03/09/2022, 9:17 AM

## 2022-03-09 NOTE — TOC Transition Note (Signed)
Transition of Care [facility]) - CM/SW Discharge Note   Patient Details  Name: [name] Rice MRN: [id number] Date of Birth: 04-15-1941  [facility] of [facility]) CM/SW Contact:  Tom-Johnson, Renea Ee, RN Phone Number: 03/09/2022, 10:55 AM   Clinical Narrative:     Patient is scheduled for discharge today. Home Health PT referral called in to [facility] at [facility] and Garth Schlatter [phone]) voiced acceptance. Order faxed to [phone]) Daughter to transport at discharge. No further TOC needs noted.   Final next level of care: [place] Barriers to Discharge: Barriers Resolved   Patient Goals and CMS Choice Patient states their goals for this hospitalization and ongoing recovery are:: To return home to her IL at [facility]. CMS Medicare.gov Compare Post Acute Care list provided to:: Patient Choice offered to / list presented to : Patient  Discharge Placement                Patient to be transferred to facility by: Daughter      Discharge Plan and Services                DME Arranged: N/A DME Agency: NA       HH Arranged: PT [place] Agency: Other - See comment ([place] [facility]) Date [place]: 03/09/22 Time [place]: 1000 Representative spoke with at [place]: [place] ([place]) Interventions     Readmission Risk Interventions     No data to display

## 2022-03-09 NOTE — Discharge Summary (Signed)
Discharge Summary    Patient ID: [name] Rice MRN: [id number]; DOB: [birth year]  Admit date: 03/04/2022 Discharge date: 03/09/2022  PCP:  Lavone Orn, MD   [facility] [facility] Providers Cardiologist:  Dr Phineas Inches    Discharge Diagnoses    Principal Problem:   Atrial fibrillation with RVR [facility]) Active Problems:   Palpable purpura ([place])    Diagnostic Studies/Procedures    Echo from 03/05/22:   1. Left ventricular ejection fraction, by estimation, is 50 to 55%. The  left ventricle has low normal function.   2. RV / RA gradient estimaged to be 31 mmHg. Dilated IVC - RA pressure  estimated at 15 mm Hg.      Estimaged PA pressure of 46 mmHg. Marland Kitchen There is moderately elevated  pulmonary artery systolic pressure.   3. Left atrial size was mildly dilated.   4. Moderate to severe mitral valve regurgitation.   5. Tricuspid valve regurgitation is moderate.   6. The [place] is heavily calcified . The aortic valve is tricuspid. There is  mild calcification of the aortic valve.   Comparison(s): 11/10/21-EF 60-65%.  _____________   History of Present Illness     Per H&P on 03/04/22:  [name] Rice is a 81 y.o. female with a hx of bronchiectasis, remote L breast CA  s/p radiation and chemo, new onset afib chads2vasc=3 10/2021. She is rate controlled on dilt 180. She's on eliquis 2.5 mg BID. She had DCCV 12/03/2021, was not successful. She converted to sinus rhythm; however went in to atrial flutter with variable AV block. Her echo from March 2023 shows normal LV function, mild pulmonary htn RVSP 41 mmhg, LA vol  index 41 cc/m2, mild-moderate MR.    03/04/22, she's felt tired lately. No LH or dizziness. She was SOB with walking up a flight of stairs. No chest pain or pressures. She slept sitting up for 2-3 years; but by the morning she notes she ends up flat. She denied PND. She's had LE edema for a month which was increasing. She has also developed diffuse  purpura. Her PCP Dr. Laurann Montana stopped  her diltiazem and lasix with concern she may have a drug rash. Also, her daughter notes that she had some mild confusion the other day. No recent lab work. In terms of her lung disease, she follows with Dr. Elsworth Soho. 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 High res CT 02/04/2021 shows extensive  patchy bronchiectasis.  Hospital Course     Consultants: Hospital medicine     Persistent Atrial fibrillation with RVR - TSH WNL from 03/08/22 - Echo from 03/05/22 with LVEF 50-55%, RV / RA gradient estimaged to be 31 mmHg. Dilated IVC - RA pressure estimated at 15 mm Hg. Estimaged PA pressure of 46 mmHg. Mild LAE. Moderate to severe MR. Moderate TR.  - continue Metoprolol XL 48m daily  -  6/26 completed digoxin load,  continue digoxin 0.065 mg daily, plan to check digoxin level in [place] office 03/16/22 - s/p DCCV 03/08/22 with conversion to sinus rhythm with sinus arrhythmia, runs of PACs.  - continue pradaxa and if cost prohibitive can plan to transition to xarelto as an outpatient after 30 days.  - cardiology follow up arranged on 04/05/22    Acute decompensated diastolic heart failure - presented with mild volume overload this admission - normal left ventricular systolic function by echo in March, 2023. She also has moderate pulmonary hypertension which is likely due to her underlying lung disease as well as  her moderate to severe MR. EF mildly low.  -She is euvolemic now after diuresis, renal index stable. Transition to 20 mg lasix orally; can take an extra dose of 20 if gains >5 pounds in one week. Recommend low sodium diet. continue daily K supplement.    Purpuric skin lesions - Unclear etiology. Normal platelet count. Diltiazem was stopped. Lasix was stopped then restarted. Her rash is improving. Her eliquis was switched to pradaxa as a possible cause. She has an appointment with rheumatology on 7/10. No steroids at this time is recommended. Appreciate hospital medicine input.  Advised the patient to follow up  with dermatology as scheduled.    Neutrophilic leukocytosis - New this admission, no symptoms of infection and no fever. Hodpital medicine consulted, recommend monitor off antibiotic.    Microscopic hematuria:  - CT abd/pelvis performed without mass or stone. "mild perinephric stranding perhaps slightly worse on the right than the left" is mentioned in radiology report. The patient has no urinary symptoms or fever, etc. to suggest pyelonephritis.  [facility] medicine had discussed with urology, Dr. Tresa Moore who personally reviewed the images and feels no further work up or treatment should be necessary. Patient is urged to follow up with [facility] (known to the practice s/p bladder tack remotely) for consideration of cystoscopy.     Did the patient have an acute coronary syndrome (MI, NSTEMI, STEMI, etc) this admission?:  No                               Did the patient have a percutaneous coronary intervention (stent / angioplasty)?:  No.          _____________  Discharge Vitals Blood pressure 137/75, pulse 70, temperature (!) 97.5 F (36.4 C), temperature source Oral, resp. rate 18, height _0  (1.575 m), weight 48.7 kg, SpO2 100 %.  Filed Weights   03/07/22 0441 03/08/22 0617 03/09/22 0500  Weight: 49 kg 47.9 kg 48.7 kg   Vitals:  Vitals:   03/09/22 0500 03/09/22 0734  BP: 120/81 137/75  Pulse: 72 70  Resp:  18  Temp: 97.8 F (36.6 C) (!) 97.5 F (36.4 C)  SpO2: 98% 100%   General Appearance: In no apparent distress, laying in bed HEENT: Normocephalic, atraumatic.  Neck: Supple, trachea midline, no JVDs Cardiovascular: Regular rate and rhythm, normal S1-S2,  no murmur Respiratory: Resting breathing unlabored, lungs sounds clear to auscultation bilaterally, no use of accessory muscles. On room air.  No wheezes, rales or rhonchi.   Gastrointestinal: Bowel sounds positive, abdomen soft, non-tender Extremities: Able to move all extremities in bed without  difficulty Musculoskeletal: Normal muscle bulk and tone Skin:Purple papules with various size noted on bilateral lower extremity and bilateral wrists, see picture  Neurologic: Alert, oriented to person, place and time. Fluent speech, no facial droop, no cognitive deficit Psychiatric: Normal affect. Mood is appropriate.               Labs & Radiologic Studies    CBC Recent Labs    03/07/22 0127 03/09/22 0130  WBC 13.3* 14.1*  NEUTROABS  --  10.0*  HGB 11.2* 10.3*  HCT 34.2* 30.9*  MCV 91.7 90.9  PLT 336 756   Basic Metabolic Panel Recent Labs    03/07/22 0127 03/08/22 0051 03/09/22 0130  NA 132* 134* 134*  K 3.7 4.0 3.6  CL 94* 99 101  CO2 _1 GLUCOSE 111*  108* 104*  BUN _0 CREATININE 0.77 0.72 0.67  CALCIUM 8.4* 8.4* 8.1*  MG 1.8 1.8  --    Liver Function Tests No results for input(s): "AST", "ALT", "ALKPHOS", "BILITOT", "PROT", "ALBUMIN" in the last 72 hours. No results for input(s): "LIPASE", "AMYLASE" in the last 72 hours. High Sensitivity Troponin:   No results for input(s): "TROPONINIHS" in the last 720 hours.  BNP Invalid input(s): "POCBNP" D-Dimer No results for input(s): "DDIMER" in the last 72 hours. Hemoglobin A1C No results for input(s): "HGBA1C" in the last 72 hours. Fasting Lipid Panel No results for input(s): "CHOL", "HDL", "LDLCALC", "TRIG", "CHOLHDL", "LDLDIRECT" in the last 72 hours. Thyroid Function Tests Recent Labs    03/08/22 0051  TSH 1.655   _____________  CT ABDOMEN PELVIS WO CONTRAST  Result Date: 03/08/2022 CLINICAL DATA:  An 81 year old female presents for evaluation of the abdomen in the setting of microscopic hematuria. EXAM: CT ABDOMEN AND PELVIS WITHOUT CONTRAST TECHNIQUE: Multidetector CT imaging of the abdomen and pelvis was performed following the standard protocol without IV contrast. RADIATION DOSE REDUCTION: This exam was performed according to the departmental dose-optimization program which  includes automated exposure control, adjustment of the mA and/or kV according to patient size and/or use of iterative reconstruction technique. COMPARISON:  CT of the chest from Feb 04, 2021. FINDINGS: Lower chest: Areas of mucoid impaction in branching opacities at the RIGHT and LEFT lung base. No pleural effusion. No dense consolidative changes. Appearance is improved based on comparison with imaging from Feb 04, 2021. Hepatobiliary: Smooth hepatic contours. No pericholecystic stranding. Cholelithiasis with moderately large gallstones up to 13 mm greatest dimension throughout the dependent gallbladder. Pancreas: Normal contour without signs of adjacent inflammation. Spleen: Normal. Adrenals/Urinary Tract: Adrenal glands are normal. Mild perinephric stranding perhaps slightly worse on the RIGHT than the LEFT. No hydronephrosis. No visible ureteral calculi though lack of retroperitoneal and intra-abdominal fat limits assessment. No perivesical stranding. Stomach/Bowel: No gross perigastric stranding. No signs of small bowel obstruction. No signs of small bowel inflammation. Appendix not visible though there are no signs to suggest acute appendiceal process in the RIGHT lower quadrant. Colon is under distended limiting assessment. Vascular/Lymphatic: Aortic atherosclerosis. No sign of aneurysm. Smooth contour of the IVC. There is no gastrohepatic or hepatoduodenal ligament lymphadenopathy. No retroperitoneal or mesenteric lymphadenopathy. No pelvic sidewall lymphadenopathy. Limited assessment due to lack of intravenous contrast. Reproductive: Unremarkable by CT. Other: No ascites.  No pneumoperitoneum. Musculoskeletal: No acute bone finding. No destructive bone process. Spinal degenerative changes. IMPRESSION: 1. Mild perinephric stranding perhaps slightly worse on the RIGHT than the LEFT. No visible ureteral calculi though lack of retroperitoneal and intra-abdominal fat limits assessment. Correlate with urinalysis  to exclude urinary tract infection/RIGHT-sided pyelonephritis. Would also correlate with urinalysis and with symptoms. 2. No signs of nephrolithiasis or ureteral calculi. 3. Improving appearance of areas of mucoid impaction in multi nodularity at the lung bases likely related to chronic infection, potentially MAI. 4. Cholelithiasis. 5. Aortic atherosclerosis. Aortic Atherosclerosis (ICD10-I70.0). Electronically Signed   By: Zetta Bills M.D.   On: 03/08/2022 12:43   ECHOCARDIOGRAM LIMITED  Result Date: 03/05/2022    ECHOCARDIOGRAM LIMITED REPORT   Patient Name:   [name] Rice Date of Exam: 03/05/2022 Medical Rec #:  [id number]        Height:       62.0 in Accession #:    [id number]       Weight:       112.9 lb  Date of Birth:  1941/08/15         BSA:          1.499 m Patient Age:    2 years         BP:           100/68 mmHg Patient Gender: F                HR:           110 bpm. Exam Location:  Inpatient Procedure: Limited Echo and Limited Color Doppler Indications:    F75.10 Acute diastolic (congestive) heart failure  History:        Patient has prior history of Echocardiogram examinations, most                 recent 11/10/2021. Arrythmias:Atrial Fibrillation; Risk                 Factors:Former Smoker. History of left breast cancer.  Sonographer:    Wilkie Aye RVT Referring Phys: [id number] Darreld Mclean  Sonographer Comments: Technically difficult study due to poor echo windows. IMPRESSIONS  1. Left ventricular ejection fraction, by estimation, is 50 to 55%. The left ventricle has low normal function.  2. RV / RA gradient estimaged to be 31 mmHg. Dilated IVC - RA pressure estimated at 15 mm Hg.     Estimaged PA pressure of 46 mmHg. Marland Kitchen There is moderately elevated pulmonary artery systolic pressure.  3. Left atrial size was mildly dilated.  4. Moderate to severe mitral valve regurgitation.  5. Tricuspid valve regurgitation is moderate.  6. The [place] is heavily calcified . The aortic valve is tricuspid. There  is mild calcification of the aortic valve. Comparison(s): 11/10/21-EF 60-65%. FINDINGS  Left Ventricle: Left ventricular ejection fraction, by estimation, is 50 to 55%. The left ventricle has low normal function. Right Ventricle: RV / RA gradient estimaged to be 31 mmHg. Dilated IVC - RA pressure estimated at 15 mm Hg. Estimaged PA pressure of 46 mmHg. There is moderately elevated pulmonary artery systolic pressure. Left Atrium: Left atrial size was mildly dilated. Right Atrium: Right atrial size was normal in size. Mitral Valve: Moderate to severe mitral valve regurgitation. Tricuspid Valve: Tricuspid valve regurgitation is moderate. Aortic Valve: The [place] is heavily calcified. The aortic valve is tricuspid. There is mild calcification of the aortic valve. Aorta: The aortic root and ascending aorta are structurally normal, with no evidence of dilitation. IAS/Shunts: The atrial septum is grossly normal. LEFT VENTRICLE PLAX 2D LVIDd:         3.90 cm LVIDs:         2.80 cm LV PW:         0.80 cm LV IVS:        0.75 cm LVOT diam:     1.90 cm LVOT Area:     2.84 cm  LV Volumes (MOD) LV vol d, MOD A4C: 58.6 ml LV vol s, MOD A4C: 30.9 ml LV SV MOD A4C:     58.6 ml IVC IVC diam: 2.30 cm LEFT ATRIUM           Index        RIGHT ATRIUM           Index LA diam:      4.00 cm 2.67 cm/m   RA Area:     15.70 cm LA Vol (A2C): 25.8 ml 17.21 ml/m  RA Volume:   43.20 ml  28.81 ml/m LA Vol (  A4C): 55.6 ml 37.09 ml/m   AORTA Ao Root diam: 2.80 cm Ao Asc diam:  3.30 cm  SHUNTS Systemic Diam: 1.90 cm Mertie Moores MD Electronically signed by Mertie Moores MD Signature Date/Time: 03/05/2022/1:21:45 PM    Final    DG CHEST PORT 1 VIEW  Result Date: 03/04/2022 CLINICAL DATA:  Difficulty breathing EXAM: PORTABLE CHEST 1 VIEW COMPARISON:  01/12/2022 FINDINGS: Transverse diameter of heart is slightly increased. There are no signs of alveolar pulmonary edema or focal pulmonary consolidation. Small linear densities in the lateral aspect of  right upper lung fields may suggest scarring. There is interval decrease in interstitial markings in both lungs. There is blunting of left lateral CP angle. There is no pneumothorax. IMPRESSION: There are no signs of alveolar pulmonary edema or new focal infiltrates. Possible small left pleural effusion. Electronically Signed   By: Elmer Picker M.D.   On: 03/04/2022 16:05    Disposition   Patient states she is felling well, denied any chest pain, SOB, heart palpitation, dizziness. She also denied any fever, chills, worsening of chronic cough, nausea, vomiting, abdominal pain, diarrhea, dysuria. She is agreeable with cardiology follow up. Advised the patient to follow up with her dermatology and rheumatology regarding her continued workup of her rash and leukocytosis.  Pt is being discharged home today in good condition.  Follow-up Plans & Appointments     Follow-up Information     Janina Mayo, MD Follow up on 04/05/2022.   Specialty: Cardiology Why: _0 :20 for follow up Contact information: [address] [place] [address] [place] Follow up on 03/16/2022.   Specialty: Cardiology Why: for blood work between [employer] information: [place] [phone]               Discharge Instructions     Diet - low sodium heart healthy   Complete by: As directed    Increase activity slowly   Complete by: As directed    No wound care   Complete by: As directed        Discharge Medications   Allergies as of 03/09/2022       Reactions   Apraclonidine Other (See Comments)   Redness of eye   Iodine Other (See Comments)   Iodine in eye drops - redness of eyes        Medication List     STOP taking these medications    Eliquis 2.5 MG Tabs tablet Generic drug: apixaban       TAKE these medications    acetaminophen 325 MG tablet Commonly known as:  TYLENOL Take 325 mg by mouth every 6 (six) hours as needed for mild pain.   albuterol 108 (90 Base) MCG/ACT inhaler Commonly known as: VENTOLIN HFA INHALE 2 PUFFS INTO THE LUNGS EVERY 6 HOURS AS NEEDED FOR WHEEZING/SHORNTESS OF BREATH What changed: See the new instructions.   Azopt 1 % ophthalmic suspension Generic drug: brinzolamide Place 1 drop into the right eye 2 (two) times daily.   Cholecalciferol 50 MCG (2000 UT) Caps Take 2,000 Units by mouth 3 (three) times a week.   dabigatran 150 MG Caps capsule Commonly known as: PRADAXA Take 1 capsule (150 mg total) by mouth every 12 (twelve) hours.   DELSYM PO Take 10 mLs by mouth at bedtime.   Digoxin 62.5 MCG Tabs Take 0.0625 mg by mouth daily.   furosemide  20 MG tablet Commonly known as: LASIX Take 1 tablet (20 mg total) by mouth daily.   latanoprost 0.005 % ophthalmic solution Commonly known as: XALATAN Place 1 drop into the right eye at bedtime.   metoprolol succinate 50 MG 24 hr tablet Commonly known as: TOPROL-XL Take 50 mg by mouth daily.   potassium chloride 20 MEQ packet Commonly known as: KLOR-CON Take 20 mEq by mouth daily.   sodium chloride HYPERTONIC 3 % nebulizer solution Take by nebulization daily. What changed:  how much to take when to take this reasons to take this           Outstanding Labs/Studies   Digoxin level on 03/16/22 [place] office    Duration of Discharge Encounter   Greater than 30 minutes including physician time.  Signed, Margie Billet, NP 03/09/2022, 8:54 AM

## 2022-03-09 NOTE — Progress Notes (Signed)
[facility] consult progress note                                                                              [name] Rice, is a 81 y.o. female, DOB - 31-Jul-1941, LTV:[id number] Admit date - 03/04/2022    Outpatient Primary MD for the patient is Lavone Orn, MD  LOS - 5  days  No chief complaint on file.      Brief summary   [name] Rice is a 81 y.o. 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 Elsworth Soho, left breast cancer s/p lumpectomy with radiation and chemotherapy, and paroxysmal atrial fibrillation on Eliquis patient diagnosed 11/11/2021 presented as a direct admission from cardiology office for fibrillation/flutter with RVR with increasing lower extremity edema concerning for decompensated heart failure with BNP elevated at 489.3.  TRH consulted on 6/24 for rash.  Assessment & Plan    Principal Problem:   Atrial fibrillation with RVR (HCC) -Per primary service, cardiology  Active Problems: Acute on chronic diastolic CHF, HFpEF, moderate MR -Per cardiology    Palpable purpura ([place]), neutrophilic dermatosis -Patient has appointment with her rheumatologist, Dr. Aggie Cosier on 7/10, will also follow-up with dermatology, Dr. Rozann Lesches  Neutrophilic leukocytosis -Remains stable without antimicrobial therapy, no fevers -Continue monitoring off antibiotics, outpatient follow-up with PCP and obtain CBC  Microscopic hematuria -Dr. Bonner Puna had spoken with Dr. Bess Harvest, urology, recommended no further work-up or treatment.  Was recommended to follow-up with [facility], she is known to the practice for consideration of cystoscopy  History of MAI status post 6 months treatment, resultant extensive bronchiectasis -Currently not symptomatic.  CT abdomen pelvis that showed improving appearance of mucoid impaction and nodularity in the included lung bases.  History of left breast CA status post XRT, chemotherapy  Code Status: Partial DVT  Prophylaxis:   dabigatran (PRADAXA) capsule 150 mg   Level of Care: Level of care: Telemetry Cardiac Family Communication: Updated patient   Disposition Plan:       Per primary service.  No acute issues, will sign off.  Patient is discharging home today per the primary service/cardiology    Medications  brinzolamide  1 drop Right Eye BID   dabigatran  150 mg Oral Q12H   digoxin  0.0625 mg Oral Daily   furosemide  20 mg Oral Daily   latanoprost  1 drop Right Eye QHS   metoprolol succinate  50 mg Oral Daily   potassium chloride  20 mEq Oral Daily      Subjective:   [name] Rice was seen and examined today AM.  Patient was seen by cardiology and had recommended DC home today.  Patient was looking forward to discharge.  Objective:   Vitals:   03/08/22 2006 03/08/22 2357 03/09/22 0500 03/09/22 0734  BP: 110/63 115/76 120/81 137/75  Pulse: 85 80 72 70  Resp:    18  Temp: 98.5 F (36.9 C) 98.4 F (36.9 C) 97.8 F (36.6 C) (!) 97.5 F (36.4 C)  TempSrc: Oral Oral Oral Oral  SpO2: 96% 98% 98% 100%  Weight:   48.7 kg   Height:  Intake/Output Summary (Last 24 hours) at 03/09/2022 1036 Last data filed at 03/08/2022 2200 Gross per 24 hour  Intake --  Output 650 ml  Net -650 ml     Wt Readings from Last 3 Encounters:  03/09/22 48.7 kg  03/04/22 51.2 kg  12/08/21 51 kg     Exam General: Alert and oriented x 3, NAD Cardiovascular: S1 S2 auscultated,  RRR Respiratory: Clear to auscultation bilaterally Gastrointestinal: Soft, nontender, nondistended, + bowel sounds Ext: no pedal edema bilaterally Neuro: Strength 5/5 upper and lower extremities bilaterally Skin: Lesions on hands/wrists, stable.  Legs wrapped. Psych: Normal affect and demeanor, alert and oriented x3     Data Reviewed:  I have personally reviewed following labs    CBC Lab Results  Component Value Date   WBC 14.1 (H) 03/09/2022   RBC 3.40 (L) 03/09/2022   HGB 10.3 (L) 03/09/2022   HCT  30.9 (L) 03/09/2022   MCV 90.9 03/09/2022   MCH 30.3 03/09/2022   PLT 326 03/09/2022   MCHC 33.3 03/09/2022   RDW 13.4 03/09/2022   LYMPHSABS 2.6 03/09/2022   MONOABS 1.3 (H) 03/09/2022   EOSABS 0.1 03/09/2022   BASOSABS 0.0 64/68/0321     Last metabolic panel Lab Results  Component Value Date   NA 134 (L) 03/09/2022   K 3.6 03/09/2022   CL 101 03/09/2022   CO2 26 03/09/2022   BUN 20 03/09/2022   CREATININE 0.67 03/09/2022   GLUCOSE 104 (H) 03/09/2022   GFRNONAA >60 03/09/2022   GFRAA 98 08/06/2019   CALCIUM 8.1 (L) 03/09/2022   PROT 7.1 06/28/2020   ALBUMIN 2.6 (L) 06/28/2020   BILITOT 1.2 06/28/2020   ALKPHOS 107 06/28/2020   AST 22 06/28/2020   ALT 17 06/28/2020   ANIONGAP 7 03/09/2022    CBG (last 3)  No results for input(s): "GLUCAP" in the last 72 hours.    Coagulation Profile: No results for input(s): "INR", "PROTIME" in the last 168 hours.   Radiology Studies: I have personally reviewed the imaging studies  CT ABDOMEN PELVIS WO CONTRAST  Result Date: 03/08/2022 CLINICAL DATA:  An 81 year old female presents for evaluation of the abdomen in the setting of microscopic hematuria. EXAM: CT ABDOMEN AND PELVIS WITHOUT CONTRAST TECHNIQUE: Multidetector CT imaging of the abdomen and pelvis was performed following the standard protocol without IV contrast. RADIATION DOSE REDUCTION: This exam was performed according to the departmental dose-optimization program which includes automated exposure control, adjustment of the mA and/or kV according to patient size and/or use of iterative reconstruction technique. COMPARISON:  CT of the chest from Feb 04, 2021. FINDINGS: Lower chest: Areas of mucoid impaction in branching opacities at the RIGHT and LEFT lung base. No pleural effusion. No dense consolidative changes. Appearance is improved based on comparison with imaging from Feb 04, 2021. Hepatobiliary: Smooth hepatic contours. No pericholecystic stranding. Cholelithiasis with  moderately large gallstones up to 13 mm greatest dimension throughout the dependent gallbladder. Pancreas: Normal contour without signs of adjacent inflammation. Spleen: Normal. Adrenals/Urinary Tract: Adrenal glands are normal. Mild perinephric stranding perhaps slightly worse on the RIGHT than the LEFT. No hydronephrosis. No visible ureteral calculi though lack of retroperitoneal and intra-abdominal fat limits assessment. No perivesical stranding. Stomach/Bowel: No gross perigastric stranding. No signs of small bowel obstruction. No signs of small bowel inflammation. Appendix not visible though there are no signs to suggest acute appendiceal process in the RIGHT lower quadrant. Colon is under distended limiting assessment. Vascular/Lymphatic: Aortic atherosclerosis. No sign of  aneurysm. Smooth contour of the IVC. There is no gastrohepatic or hepatoduodenal ligament lymphadenopathy. No retroperitoneal or mesenteric lymphadenopathy. No pelvic sidewall lymphadenopathy. Limited assessment due to lack of intravenous contrast. Reproductive: Unremarkable by CT. Other: No ascites.  No pneumoperitoneum. Musculoskeletal: No acute bone finding. No destructive bone process. Spinal degenerative changes. IMPRESSION: 1. Mild perinephric stranding perhaps slightly worse on the RIGHT than the LEFT. No visible ureteral calculi though lack of retroperitoneal and intra-abdominal fat limits assessment. Correlate with urinalysis to exclude urinary tract infection/RIGHT-sided pyelonephritis. Would also correlate with urinalysis and with symptoms. 2. No signs of nephrolithiasis or ureteral calculi. 3. Improving appearance of areas of mucoid impaction in multi nodularity at the lung bases likely related to chronic infection, potentially MAI. 4. Cholelithiasis. 5. Aortic atherosclerosis. Aortic Atherosclerosis (ICD10-I70.0). Electronically Signed   By: Zetta Bills M.D.   On: 03/08/2022 12:43       Rozina Pointer M.D. [facility] 03/09/2022, 10:36 AM  Available via Epic secure chat 7am-7pm After 7 pm, please refer to night coverage provider listed on amion.

## 2022-03-10 ENCOUNTER — Emergency Department (HOSPITAL_COMMUNITY): Payer: Medicare Other

## 2022-03-10 ENCOUNTER — Other Ambulatory Visit: Payer: Self-pay

## 2022-03-10 ENCOUNTER — Encounter (HOSPITAL_COMMUNITY): Payer: Self-pay | Admitting: Emergency Medicine

## 2022-03-10 ENCOUNTER — Inpatient Hospital Stay (HOSPITAL_COMMUNITY)
Admission: EM | Admit: 2022-03-10 | Discharge: 2022-03-17 | DRG: 689 | Disposition: A | Discharge status: Skilled Nursing Facility | Payer: Medicare Other | Attending: Family Medicine | Admitting: Family Medicine

## 2022-03-10 DIAGNOSIS — J479 Bronchiectasis, uncomplicated: Secondary | ICD-10-CM | POA: Diagnosis present

## 2022-03-10 DIAGNOSIS — E860 Dehydration: Secondary | ICD-10-CM | POA: Diagnosis present

## 2022-03-10 DIAGNOSIS — D75839 Thrombocytosis, unspecified: Secondary | ICD-10-CM | POA: Diagnosis present

## 2022-03-10 DIAGNOSIS — N3001 Acute cystitis with hematuria: Secondary | ICD-10-CM | POA: Diagnosis not present

## 2022-03-10 DIAGNOSIS — K140 Glossitis: Secondary | ICD-10-CM | POA: Diagnosis present

## 2022-03-10 DIAGNOSIS — Z87891 Personal history of nicotine dependence: Secondary | ICD-10-CM | POA: Diagnosis not present

## 2022-03-10 DIAGNOSIS — Z923 Personal history of irradiation: Secondary | ICD-10-CM

## 2022-03-10 DIAGNOSIS — D692 Other nonthrombocytopenic purpura: Secondary | ICD-10-CM | POA: Diagnosis not present

## 2022-03-10 DIAGNOSIS — R4182 Altered mental status, unspecified: Secondary | ICD-10-CM | POA: Diagnosis not present

## 2022-03-10 DIAGNOSIS — R3129 Other microscopic hematuria: Secondary | ICD-10-CM | POA: Diagnosis present

## 2022-03-10 DIAGNOSIS — D72828 Other elevated white blood cell count: Secondary | ICD-10-CM | POA: Diagnosis present

## 2022-03-10 DIAGNOSIS — L0390 Cellulitis, unspecified: Secondary | ICD-10-CM | POA: Diagnosis not present

## 2022-03-10 DIAGNOSIS — G9341 Metabolic encephalopathy: Secondary | ICD-10-CM | POA: Diagnosis present

## 2022-03-10 DIAGNOSIS — I4819 Other persistent atrial fibrillation: Secondary | ICD-10-CM | POA: Diagnosis present

## 2022-03-10 DIAGNOSIS — L03116 Cellulitis of left lower limb: Secondary | ICD-10-CM | POA: Diagnosis present

## 2022-03-10 DIAGNOSIS — E871 Hypo-osmolality and hyponatremia: Secondary | ICD-10-CM | POA: Diagnosis not present

## 2022-03-10 DIAGNOSIS — E8809 Other disorders of plasma-protein metabolism, not elsewhere classified: Secondary | ICD-10-CM | POA: Diagnosis present

## 2022-03-10 DIAGNOSIS — N390 Urinary tract infection, site not specified: Principal | ICD-10-CM | POA: Diagnosis present

## 2022-03-10 DIAGNOSIS — I5032 Chronic diastolic (congestive) heart failure: Secondary | ICD-10-CM | POA: Diagnosis present

## 2022-03-10 DIAGNOSIS — G9340 Encephalopathy, unspecified: Principal | ICD-10-CM

## 2022-03-10 DIAGNOSIS — Z888 Allergy status to other drugs, medicaments and biological substances status: Secondary | ICD-10-CM

## 2022-03-10 DIAGNOSIS — R9431 Abnormal electrocardiogram [ECG] [EKG]: Secondary | ICD-10-CM | POA: Diagnosis not present

## 2022-03-10 DIAGNOSIS — Z853 Personal history of malignant neoplasm of breast: Secondary | ICD-10-CM

## 2022-03-10 DIAGNOSIS — H409 Unspecified glaucoma: Secondary | ICD-10-CM | POA: Diagnosis present

## 2022-03-10 DIAGNOSIS — Z7901 Long term (current) use of anticoagulants: Secondary | ICD-10-CM | POA: Diagnosis not present

## 2022-03-10 DIAGNOSIS — D65 Disseminated intravascular coagulation [defibrination syndrome]: Secondary | ICD-10-CM | POA: Diagnosis present

## 2022-03-10 DIAGNOSIS — R410 Disorientation, unspecified: Secondary | ICD-10-CM | POA: Diagnosis not present

## 2022-03-10 DIAGNOSIS — B952 Enterococcus as the cause of diseases classified elsewhere: Secondary | ICD-10-CM | POA: Diagnosis present

## 2022-03-10 DIAGNOSIS — L03115 Cellulitis of right lower limb: Secondary | ICD-10-CM | POA: Diagnosis present

## 2022-03-10 DIAGNOSIS — Z79899 Other long term (current) drug therapy: Secondary | ICD-10-CM

## 2022-03-10 DIAGNOSIS — R21 Rash and other nonspecific skin eruption: Secondary | ICD-10-CM | POA: Diagnosis present

## 2022-03-10 LAB — COMPREHENSIVE METABOLIC PANEL
ALT: 21 U/L (ref 0–44)
AST: 25 U/L (ref 15–41)
Albumin: 3.1 g/dL — ABNORMAL LOW (ref 3.5–5.0)
Alkaline Phosphatase: 109 U/L (ref 38–126)
Anion gap: 10 (ref 5–15)
BUN: 19 mg/dL (ref 8–23)
CO2: 22 mmol/L (ref 22–32)
Calcium: 8.7 mg/dL — ABNORMAL LOW (ref 8.9–10.3)
Chloride: 98 mmol/L (ref 98–111)
Creatinine, Ser: 0.74 mg/dL (ref 0.44–1.00)
GFR, Estimated: >60 mL/min (ref >60)
Glucose, Bld: 152 mg/dL — ABNORMAL HIGH (ref 70–99)
Potassium: 5 mmol/L (ref 3.5–5.1)
Sodium: 130 mmol/L — ABNORMAL LOW (ref 135–145)
Total Bilirubin: 1.6 mg/dL — ABNORMAL HIGH (ref 0.3–1.2)
Total Protein: 8.3 g/dL — ABNORMAL HIGH (ref 6.5–8.1)

## 2022-03-10 LAB — URINALYSIS, ROUTINE W REFLEX MICROSCOPIC
Bilirubin Urine: NEGATIVE
Glucose, UA: NEGATIVE mg/dL
Ketones, ur: NEGATIVE mg/dL
Nitrite: NEGATIVE
Protein, ur: 100 mg/dL — AB
RBC / HPF: >50 RBC/hpf — ABNORMAL HIGH (ref 0–5)
Specific Gravity, Urine: 1.029 (ref 1.005–1.030)
WBC, UA: >50 WBC/hpf — ABNORMAL HIGH (ref 0–5)
pH: 5 (ref 5.0–8.0)

## 2022-03-10 LAB — CBC
HCT: 34.6 % — ABNORMAL LOW (ref 36.0–46.0)
Hemoglobin: 11 g/dL — ABNORMAL LOW (ref 12.0–15.0)
MCH: 29.1 pg (ref 26.0–34.0)
MCHC: 31.8 g/dL (ref 30.0–36.0)
MCV: 91.5 fL (ref 80.0–100.0)
Platelets: 390 10*3/uL (ref 150–400)
RBC: 3.78 MIL/uL — ABNORMAL LOW (ref 3.87–5.11)
RDW: 13.4 % (ref 11.5–15.5)
WBC: 20.6 10*3/uL — ABNORMAL HIGH (ref 4.0–10.5)
nRBC: 0 % (ref 0.0–0.2)

## 2022-03-10 LAB — CBG MONITORING, ED: Glucose-Capillary: 155 mg/dL — ABNORMAL HIGH (ref 70–99)

## 2022-03-10 LAB — DIGOXIN LEVEL: Digoxin Level: 0.4 ng/mL — ABNORMAL LOW (ref 0.8–2.0)

## 2022-03-10 MED ORDER — ACETAMINOPHEN 650 MG RE SUPP: 650.0000 mg | Freq: Four times a day (QID) | RECTAL | Status: DC | PRN

## 2022-03-10 MED ORDER — KETOROLAC TROMETHAMINE 15 MG/ML IJ SOLN: 10.0000 mg | Freq: Once | INTRAMUSCULAR | Status: DC

## 2022-03-10 MED ORDER — VITAMIN D 25 MCG (1000 UNIT) PO TABS
2000.0000 [IU] | ORAL_TABLET | ORAL | Status: DC
  Administered 2022-03-11 – 2022-03-16 (×3): 2000 [IU] via ORAL
  Filled 2022-03-10 (×6): qty 2

## 2022-03-10 MED ORDER — SODIUM CHLORIDE 0.9 % IV SOLN
2.0000 g | INTRAVENOUS | Status: DC
Start: 2022-03-11 — End: 2022-03-10
  Filled 2022-03-10: qty 20

## 2022-03-10 MED ORDER — ALBUTEROL SULFATE (2.5 MG/3ML) 0.083% IN NEBU: INHALATION_SOLUTION | Freq: Four times a day (QID) | RESPIRATORY_TRACT | Status: DC | PRN

## 2022-03-10 MED ORDER — BRINZOLAMIDE 1 % OP SUSP
1.0000 [drp] | Freq: Two times a day (BID) | OPHTHALMIC | Status: DC
  Administered 2022-03-10 – 2022-03-17 (×14): 1 [drp] via OPHTHALMIC
  Filled 2022-03-10: qty 10

## 2022-03-10 MED ORDER — SODIUM CHLORIDE 0.9 % IV SOLN
2.0000 g | INTRAVENOUS | Status: DC
  Administered 2022-03-11 – 2022-03-14 (×4): 2 g via INTRAVENOUS
  Filled 2022-03-10 (×6): qty 20

## 2022-03-10 MED ORDER — SODIUM CHLORIDE 0.9 % IV SOLN: INTRAVENOUS | Status: DC

## 2022-03-10 MED ORDER — LATANOPROST 0.005 % OP SOLN
1.0000 [drp] | Freq: Every day | OPHTHALMIC | Status: DC
Start: 2022-03-10 — End: 2022-03-17
  Administered 2022-03-10 – 2022-03-16 (×7): 1 [drp] via OPHTHALMIC
  Filled 2022-03-10: qty 2.5

## 2022-03-10 MED ORDER — ONDANSETRON HCL 4 MG/2ML IJ SOLN: 4.0000 mg | Freq: Four times a day (QID) | INTRAMUSCULAR | Status: DC | PRN

## 2022-03-10 MED ORDER — KETOROLAC TROMETHAMINE 15 MG/ML IJ SOLN
10.0000 mg | Freq: Once | INTRAMUSCULAR | Status: AC
  Administered 2022-03-10: 10 mg via INTRAVENOUS
  Filled 2022-03-10: qty 1

## 2022-03-10 MED ORDER — METOPROLOL SUCCINATE ER 50 MG PO TB24
50.0000 mg | ORAL_TABLET | Freq: Every day | ORAL | Status: DC
  Administered 2022-03-11 – 2022-03-17 (×7): 50 mg via ORAL
  Filled 2022-03-10 (×7): qty 1

## 2022-03-10 MED ORDER — ONDANSETRON HCL 4 MG PO TABS: 4.0000 mg | ORAL_TABLET | Freq: Four times a day (QID) | ORAL | Status: DC | PRN

## 2022-03-10 MED ORDER — DABIGATRAN ETEXILATE MESYLATE 150 MG PO CAPS
150.0000 mg | ORAL_CAPSULE | Freq: Two times a day (BID) | ORAL | Status: DC
  Administered 2022-03-10 – 2022-03-17 (×14): 150 mg via ORAL
  Filled 2022-03-10 (×16): qty 1

## 2022-03-10 MED ORDER — SODIUM CHLORIDE 0.9 % IV SOLN
2.0000 g | Freq: Once | INTRAVENOUS | Status: AC
  Administered 2022-03-10: 2 g via INTRAVENOUS
  Filled 2022-03-10: qty 20

## 2022-03-10 MED ORDER — ACETAMINOPHEN 325 MG PO TABS
650.0000 mg | ORAL_TABLET | Freq: Four times a day (QID) | ORAL | Status: DC | PRN
  Administered 2022-03-10 – 2022-03-16 (×2): 650 mg via ORAL
  Filled 2022-03-10 (×2): qty 2

## 2022-03-10 MED ORDER — DIGOXIN 125 MCG PO TABS
0.0625 mg | ORAL_TABLET | Freq: Every day | ORAL | Status: DC
  Administered 2022-03-11 – 2022-03-17 (×7): 0.0625 mg via ORAL
  Filled 2022-03-10 (×7): qty 1

## 2022-03-10 NOTE — ED Provider Notes (Signed)
[facility] EMERGENCY DEPARTMENT Provider Note   CSN: [id number] Arrival date & time: 03/10/22  1109     History  Chief Complaint  Patient presents with   Altered Mental Status    [name] Rice is a 81 y.o. female.   Altered Mental Status Patient brought in for mental status change.  Was in the hospital and discharged yesterday after a cardioversion for atrial fibrillation.  Also history of CHF.  Also has not undetermined vasculitis of her extremities.  Was doing well when she left yesterday but then became more confused.  More pain in her legs.  Had been given various medicines by patient's daughter who is a pediatrician.  Has had Tylenol 3 and Xanax at home.  She has had both these before and done fine.  More confused however even before getting this medicine.  More confused today.  Unsure why she is here.  I was able to recognize her daughter but states she is just here for hemoglobin check.  Legs are reportedly more erythematous than normal.  No injury.    Past Medical History:  Diagnosis Date   Bronchiectasis ([place])    Cancer ([place])    remission- breast   Glaucoma     Home Medications Prior to Admission medications   Medication Sig Start Date End Date Taking? Authorizing Provider  acetaminophen (TYLENOL) 325 MG tablet Take 325 mg by mouth every 6 (six) hours as needed for mild pain.    [provider]  albuterol (VENTOLIN HFA) 108 (90 Base) MCG/ACT inhaler INHALE 2 PUFFS INTO THE LUNGS EVERY 6 HOURS AS NEEDED FOR WHEEZING/SHORNTESS OF BREATH Patient taking differently: Inhale 2 puffs into the lungs every 6 (six) hours as needed for wheezing or shortness of breath. 05/04/20   Comer, Okey Regal, MD  AZOPT 1 % ophthalmic suspension Place 1 drop into the right eye 2 (two) times daily. 07/20/17   [provider]  Cholecalciferol 50 MCG (2000 UT) CAPS Take 2,000 Units by mouth 3 (three) times a week.    [provider]  dabigatran  (PRADAXA) 150 MG CAPS capsule Take 1 capsule (150 mg total) by mouth every 12 (twelve) hours. 03/09/22   Margie Billet, NP  Dextromethorphan HBr (DELSYM PO) Take 10 mLs by mouth at bedtime.    [provider]  digoxin (LANOXIN) 0.125 MG tablet Take 0.5 tablets (0.0625 mg total) by mouth daily. 03/09/22   Margie Billet, NP  furosemide (LASIX) 20 MG tablet Take 1 tablet (20 mg total) by mouth daily. 03/09/22   Margie Billet, NP  latanoprost (XALATAN) 0.005 % ophthalmic solution Place 1 drop into the right eye at bedtime. 04/25/17   [provider]  metoprolol succinate (TOPROL-XL) 50 MG 24 hr tablet Take 50 mg by mouth daily. 03/01/22   [provider]  potassium chloride 20 MEQ/15ML (10%) SOLN Take 15 mLs (20 mEq total) by mouth daily. 03/09/22   Margie Billet, NP  sodium chloride HYPERTONIC 3 % nebulizer solution Take by nebulization daily. Patient taking differently: Take 4 mLs by nebulization daily as needed (shortness of breath). 07/28/21   Rigoberto Noel, MD      Allergies    Apraclonidine and Iodine    Review of Systems   Review of Systems  Physical Exam Updated Vital Signs BP (!) 144/69   Pulse 84   Temp 98.6 F (37 C) (Oral)   Resp 18   Ht _0  (1.575 m)   Wt 48.7 kg  SpO2 98%   BMI 19.63 kg/m  Physical Exam Vitals and nursing note reviewed.  Eyes:     Pupils: Pupils are equal, round, and reactive to light.  Cardiovascular:     Rate and Rhythm: Regular rhythm.  Pulmonary:     Breath sounds: No wheezing.  Abdominal:     Tenderness: There is no abdominal tenderness.  Musculoskeletal:     Cervical back: Neck supple.  Skin:    Comments: Purpura with some blistering on hands and more extensive on lower extremities.  Surrounding erythema on lower extremities.  Neurological:     Mental Status: She is alert.     Comments: Awake and confused.  Not at her baseline.          ED Results / Procedures / Treatments   Labs (all labs ordered are listed, but  only abnormal results are displayed) Labs Reviewed  COMPREHENSIVE METABOLIC PANEL - Abnormal; Notable for the following components:      Result Value   Sodium 130 (*)    Glucose, Bld 152 (*)    Calcium 8.7 (*)    Total Protein 8.3 (*)    Albumin 3.1 (*)    Total Bilirubin 1.6 (*)    All other components within normal limits  CBC - Abnormal; Notable for the following components:   WBC 20.6 (*)    RBC 3.78 (*)    Hemoglobin 11.0 (*)    HCT 34.6 (*)    All other components within normal limits  DIGOXIN LEVEL - Abnormal; Notable for the following components:   Digoxin Level 0.4 (*)    All other components within normal limits  URINALYSIS, ROUTINE W REFLEX MICROSCOPIC - Abnormal; Notable for the following components:   Color, Urine AMBER (*)    APPearance HAZY (*)    Hgb urine dipstick LARGE (*)    Protein, ur 100 (*)    Leukocytes,Ua MODERATE (*)    RBC / HPF >50 (*)    WBC, UA >50 (*)    Bacteria, UA FEW (*)    All other components within normal limits  CBG MONITORING, ED - Abnormal; Notable for the following components:   Glucose-Capillary 155 (*)    All other components within normal limits  URINE CULTURE    EKG EKG Interpretation  Date/Time:  Thursday March 10 2022 12:38:02 EDT Ventricular Rate:  84 PR Interval:  166 QRS Duration: 93 QT Interval:  378 QTC Calculation: 447 R Axis:   77 Text Interpretation: Sinus rhythm Anterior infarct, old Confirmed by Davonna Belling [phone]) on 03/10/2022 1:08:25 PM  Radiology DG Chest Portable 1 View  Result Date: 03/10/2022 CLINICAL DATA:  Altered mental status, history of breast cancer in bronchiectasis EXAM: PORTABLE CHEST 1 VIEW COMPARISON:  Portable exam 1303 hours compared to 03/04/2022 FINDINGS: Normal heart size, mediastinal contours, and pulmonary vascularity. Chronic bronchitic and interstitial changes similar to prior study. No definite acute infiltrate, pleural effusion, or pneumothorax. Bones demineralized.  Atherosclerotic calcifications aorta. IMPRESSION: Chronic bronchitic and interstitial changes without acute infiltrate. Aortic Atherosclerosis (ICD10-I70.0). Electronically Signed   By: Lavonia Dana M.D.   On: 03/10/2022 13:13   CT HEAD WO CONTRAST (5MM)  Result Date: 03/10/2022 CLINICAL DATA:  Mental status change, unknown cause EXAM: CT HEAD WITHOUT CONTRAST TECHNIQUE: Contiguous axial images were obtained from the base of the skull through the vertex without intravenous contrast. RADIATION DOSE REDUCTION: This exam was performed according to the departmental dose-optimization program which includes automated exposure control, adjustment of  the mA and/or kV according to patient size and/or use of iterative reconstruction technique. COMPARISON:  None Available. FINDINGS: Brain: There is no acute intracranial hemorrhage, mass effect, or edema. Gray-white differentiation is preserved. There is no extra-axial fluid collection. Prominence of the ventricles and sulci reflects mild parenchymal volume loss. Patchy and confluent hypoattenuation in the supratentorial white matter is nonspecific but may reflect mild to moderate chronic microvascular ischemic changes. Vascular: There is atherosclerotic calcification at the skull base. Skull: Calvarium is unremarkable. Sinuses/Orbits: No acute finding. Other: None. IMPRESSION: No acute intracranial abnormality. Chronic microvascular ischemic changes. Electronically Signed   By: Macy Mis M.D.   On: 03/10/2022 12:35    Procedures Procedures    Medications Ordered in ED Medications  cefTRIAXone (ROCEPHIN) 2 g in sodium chloride 0.9 % 100 mL IVPB (has no administration in time range)  ketorolac (TORADOL) 15 MG/ML injection 10 mg (10 mg Intravenous Given 03/10/22 1321)    ED Course/ Medical Decision Making/ A&P                           Medical Decision Making Amount and/or Complexity of Data Reviewed Labs: ordered. Radiology:  ordered.  Risk Prescription drug management. Decision regarding hospitalization.   Patient brought on mental status change.  Had cardioversion done yesterday for atrial fibrillation.  Had been doing okay at discharge but then began to do worse.  More confusion.  Worsening redness of right leg.  Is on anticoagulation.  Is on digoxin also.  Has chronic some sort of vasculitic issues with her extremities.  White count is now elevated.  Potentially could be a cellulitis and I think would benefit from antibiotics.  Reviewed notes from recent admission to the hospital and had CT scan that showed potentially changes in both kidneys.  Reportedly had outpatient urine done did not show infection.  However urine done today showed potentially an infection.  Potentially this could be pyelonephritis with the changes and will need antibiotics.  Will discuss with hospitalist for admission.          Final Clinical Impression(s) / ED Diagnoses Final diagnoses:  Encephalopathy  Urinary tract infection without hematuria, site unspecified  Cellulitis, unspecified cellulitis site    Rx / DC Orders ED Discharge Orders     None         Davonna Belling, MD 03/10/22 1612

## 2022-03-10 NOTE — ED Notes (Signed)
This RN contacted 3W in order to initiate purple man at this time.

## 2022-03-10 NOTE — ED Triage Notes (Signed)
Patient is confused, states she is here to pick up her parents and their child. Patient oriented to self and time, disoriented to place and situation. Patient has multiple lesions on both legs but states they do not hurt. Patient is alert and in no apparent distress. Patient presents to triage alone, no additional information available.

## 2022-03-10 NOTE — ED Notes (Signed)
ED TO INPATIENT HANDOFF REPORT  ED Nurse Name and Phone #: Srishti Strnad RN [phone]  S Name/Age/Gender [name] Rice 81 y.o. female Room/Bed: 040C/040C  Code Status   Code Status: Partial Code  Home/SNF/Other Home Patient oriented to: self, place, time, and situation Patient is slightly confused. Neuro assessment is documented.  Is this baseline? Yes   Triage Complete: Triage complete  Chief Complaint UTI (urinary tract infection) [N39.0]  Triage Note Patient is confused, states she is here to pick up her parents and their child. Patient oriented to self and time, disoriented to place and situation. Patient has multiple lesions on both legs but states they do not hurt. Patient is alert and in no apparent distress. Patient presents to triage alone, no additional information available.   Allergies Allergies  Allergen Reactions   Apraclonidine Other (See Comments)    Redness of eye   Iodine Other (See Comments)    Iodine in eye drops - redness of eyes    Level of Care/Admitting Diagnosis ED Disposition     ED Disposition  Admit   Condition  --   Comment  Hospital Area: [place] [[id number]]  Level of Care: Telemetry Cardiac [[id number]]  May place patient in observation at [facility] or [place] if equivalent level of care is available:: Yes  Covid Evaluation: Asymptomatic - no recent exposure (last 10 days) testing not required  Diagnosis: UTI (urinary tract infection) [250037]  Admitting Physician: Geradine Girt [phone]  Attending Physician: Geradine Girt [[id number]]          B Medical/Surgery History Past Medical History:  Diagnosis Date   Bronchiectasis ([employer])    Cancer ([place])    remission- breast   Glaucoma    Past Surgical History:  Procedure Laterality Date   BLADDER REPAIR     BREAST LUMPECTOMY Left 2009   CARDIOVERSION N/A 12/03/2021   Procedure: CARDIOVERSION;  Surgeon: Skeet Latch, MD;  Location: [place];  Service:  Cardiovascular;  Laterality: N/A;     A IV Location/Drains/Wounds Patient Lines/Drains/Airways Status     Active Line/Drains/Airways     Name Placement date Placement time Site Days   Peripheral IV 03/10/22 20 G Anterior;Proximal;Right Forearm 03/10/22  1301  Forearm  less than 1   Wound / Incision (Open or Dehisced) 03/06/22 Other (Comment) Tibial Left;Posterior Purple blisters over bottom 1/3 of lower leg 03/06/22  0800  Tibial  4   Wound / Incision (Open or Dehisced) 03/06/22 Other (Comment) Tibial Posterior;Right purple blisters on lower 1/3 of lower leg 03/06/22  0800  Tibial  4            Intake/Output Last 24 hours  Intake/Output Summary (Last 24 hours) at 03/10/2022 1736 Last data filed at 03/10/2022 1706 Gross per 24 hour  Intake 100.42 ml  Output --  Net 100.42 ml    Labs/Imaging Results for orders placed or performed during the hospital encounter of 03/10/22 (from the past 48 hour(s))  Comprehensive metabolic panel     Status: Abnormal   Collection Time: 03/10/22 11:23 AM  Result Value Ref Range   Sodium 130 (L) 135 - 145 mmol/L   Potassium 5.0 3.5 - 5.1 mmol/L    Comment: NO VISIBLE HEMOLYSIS   Chloride 98 98 - 111 mmol/L   CO2 22 22 - 32 mmol/L   Glucose, Bld 152 (H) 70 - 99 mg/dL    Comment: Glucose reference range applies only to samples taken after fasting for at  least 8 hours.   BUN 19 8 - 23 mg/dL   Creatinine, Ser 0.74 0.44 - 1.00 mg/dL   Calcium 8.7 (L) 8.9 - 10.3 mg/dL   Total Protein 8.3 (H) 6.5 - 8.1 g/dL   Albumin 3.1 (L) 3.5 - 5.0 g/dL   AST 25 15 - 41 U/L   ALT 21 0 - 44 U/L   Alkaline Phosphatase 109 38 - 126 U/L   Total Bilirubin 1.6 (H) 0.3 - 1.2 mg/dL   GFR, Estimated >60 >60 mL/min    Comment: (NOTE) Calculated using the CKD-EPI Creatinine Equation (2021)    Anion gap 10 5 - 15    Comment: Performed at [facility], [place] [address] [place], [place] [address]  CBC     Status: Abnormal   Collection Time: 03/10/22 11:23 AM   Result Value Ref Range   WBC 20.6 (H) 4.0 - 10.5 K/uL   RBC 3.78 (L) 3.87 - 5.11 MIL/uL   Hemoglobin 11.0 (L) 12.0 - 15.0 g/dL   HCT 34.6 (L) 36.0 - 46.0 %   MCV 91.5 80.0 - 100.0 fL   MCH 29.1 26.0 - 34.0 pg   MCHC 31.8 30.0 - 36.0 g/dL   RDW 13.4 11.5 - 15.5 %   Platelets 390 150 - 400 K/uL   nRBC 0.0 0.0 - 0.2 %    Comment: Performed at [facility], [place] [address], [place], [place] [address]  CBG monitoring, ED     Status: Abnormal   Collection Time: 03/10/22 11:25 AM  Result Value Ref Range   Glucose-Capillary 155 (H) 70 - 99 mg/dL    Comment: Glucose reference range applies only to samples taken after fasting for at least 8 hours.  Urinalysis, Routine w reflex microscopic Urine, In & Out Cath     Status: Abnormal   Collection Time: 03/10/22 11:53 AM  Result Value Ref Range   Color, Urine AMBER (A) YELLOW    Comment: BIOCHEMICALS MAY BE AFFECTED BY COLOR   APPearance HAZY (A) CLEAR   Specific Gravity, Urine 1.029 1.005 - 1.030   pH 5.0 5.0 - 8.0   Glucose, UA NEGATIVE NEGATIVE mg/dL   Hgb urine dipstick LARGE (A) NEGATIVE   Bilirubin Urine NEGATIVE NEGATIVE   Ketones, ur NEGATIVE NEGATIVE mg/dL   Protein, ur 100 (A) NEGATIVE mg/dL   Nitrite NEGATIVE NEGATIVE   Leukocytes,Ua MODERATE (A) NEGATIVE   RBC / HPF >50 (H) 0 - 5 RBC/hpf   WBC, UA >50 (H) 0 - 5 WBC/hpf   Bacteria, UA FEW (A) NONE SEEN   Squamous Epithelial / LPF 0-5 0 - 5   Mucus PRESENT     Comment: Performed at [facility], [address] [place], [place] [address]  Digoxin level     Status: Abnormal   Collection Time: 03/10/22 12:38 PM  Result Value Ref Range   Digoxin Level 0.4 (L) 0.8 - 2.0 ng/mL    Comment: Performed at [facility], [place] [address] [place], [place] [address]   DG Chest Portable 1 View  Result Date: 03/10/2022 CLINICAL DATA:  Altered mental status, history of breast cancer in bronchiectasis EXAM: PORTABLE CHEST 1 VIEW COMPARISON:  Portable exam 1303 hours compared  to 03/04/2022 FINDINGS: Normal heart size, mediastinal contours, and pulmonary vascularity. Chronic bronchitic and interstitial changes similar to prior study. No definite acute infiltrate, pleural effusion, or pneumothorax. Bones demineralized. Atherosclerotic calcifications aorta. IMPRESSION: Chronic bronchitic and interstitial changes without  acute infiltrate. Aortic Atherosclerosis (ICD10-I70.0). Electronically Signed   By: Lavonia Dana M.D.   On: 03/10/2022 13:13   CT HEAD WO CONTRAST (5MM)  Result Date: 03/10/2022 CLINICAL DATA:  Mental status change, unknown cause EXAM: CT HEAD WITHOUT CONTRAST TECHNIQUE: Contiguous axial images were obtained from the base of the skull through the vertex without intravenous contrast. RADIATION DOSE REDUCTION: This exam was performed according to the departmental dose-optimization program which includes automated exposure control, adjustment of the mA and/or kV according to patient size and/or use of iterative reconstruction technique. COMPARISON:  None Available. FINDINGS: Brain: There is no acute intracranial hemorrhage, mass effect, or edema. Gray-white differentiation is preserved. There is no extra-axial fluid collection. Prominence of the ventricles and sulci reflects mild parenchymal volume loss. Patchy and confluent hypoattenuation in the supratentorial white matter is nonspecific but may reflect mild to moderate chronic microvascular ischemic changes. Vascular: There is atherosclerotic calcification at the skull base. Skull: Calvarium is unremarkable. Sinuses/Orbits: No acute finding. Other: None. IMPRESSION: No acute intracranial abnormality. Chronic microvascular ischemic changes. Electronically Signed   By: Macy Mis M.D.   On: 03/10/2022 12:35    Pending Labs Unresulted Labs (From admission, onward)     Start     Ordered   03/11/22 0500  Comprehensive metabolic panel  Tomorrow morning,   R        03/10/22 1657   03/11/22 0500  CBC  Tomorrow  morning,   R        03/10/22 1657   03/11/22 0500  Magnesium  Tomorrow morning,   R        03/10/22 1714   03/11/22 0500  Digoxin level  Tomorrow morning,   R        03/10/22 1716   03/10/22 1544  Urine Culture  Once,   URGENT       Question:  Indication  Answer:  Altered mental status (if no other cause identified)   03/10/22 1543            Vitals/Pain Today's Vitals   03/10/22 1400 03/10/22 1415 03/10/22 1430 03/10/22 1500  BP: (!) 161/78 (!) 143/99 (!) 153/94 (!) 144/69  Pulse: 83 81 83 84  Resp: (!) 24 (!) 31 (!) 21 18  Temp:      TempSrc:      SpO2: 100% 98% (!) 84% 98%  Weight:      Height:      PainSc:        Isolation Precautions No active isolations  Medications Medications  acetaminophen (TYLENOL) tablet 650 mg (has no administration in time range)    Or  acetaminophen (TYLENOL) suppository 650 mg (has no administration in time range)  ondansetron (ZOFRAN) tablet 4 mg (has no administration in time range)    Or  ondansetron (ZOFRAN) injection 4 mg (has no administration in time range)  cefTRIAXone (ROCEPHIN) 2 g in sodium chloride 0.9 % 100 mL IVPB (has no administration in time range)  albuterol (PROVENTIL) (2.5 MG/3ML) 0.083% nebulizer solution (has no administration in time range)  brinzolamide (AZOPT) 1 % ophthalmic suspension 1 drop (has no administration in time range)  cholecalciferol (VITAMIN D3) tablet 2,000 Units (has no administration in time range)  dabigatran (PRADAXA) capsule 150 mg (has no administration in time range)  digoxin (LANOXIN) tablet 0.0625 mg (has no administration in time range)  latanoprost (XALATAN) 0.005 % ophthalmic solution 1 drop (has no administration in time range)  metoprolol succinate (TOPROL-XL) 24 hr tablet 50  mg (has no administration in time range)  0.9 %  sodium chloride infusion (has no administration in time range)  ketorolac (TORADOL) 15 MG/ML injection 10 mg (10 mg Intravenous Given 03/10/22 1321)  cefTRIAXone  (ROCEPHIN) 2 g in sodium chloride 0.9 % 100 mL IVPB (0 g Intravenous Stopped 03/10/22 1706)    Mobility manual wheelchair High fall risk   R Recommendations: See Admitting Provider Note  Report given to:   Additional Notes:

## 2022-03-10 NOTE — H&P (Signed)
History and Physical    [name] Rice UVO:[id number] DOB: [birth year] DOA: 03/10/2022  I have briefly reviewed the patient's prior medical records in [facility]  PCP: Lavone Orn, MD  Patient coming from: ALF ([facility])  Chief Complaint: AMS  HPI: [name] Rice is a 81 y.o. female with medical history significant of breast cancer, bronchiectasis, a fib with recent cardioversion.  She was discharged on 6/28 after cardioversion and developed mental status change per daughter.  She went back to her ALF and became confused and was hallucinating- started gradually but worsened around 8 PM.  Daughter also reports that her legs are more swollen, painful and erythematous.    In the ER, U/A was obtained that was suggestive of an infection and there was concern for cellulitis.  Patient not able to accurately express if she is having dysuria and instead rambles on about different topics when asked questions. CT scan of brain negative for CVA/bleed.  WBC count elevated at 20.  Na low at 130 (from 134)   Review of Systems: unable to complete due to AMS   Past Medical History:  Diagnosis Date   Bronchiectasis ([place])    Cancer ([place])    remission- breast   Glaucoma     Past Surgical History:  Procedure Laterality Date   BLADDER REPAIR     BREAST LUMPECTOMY Left 2009   CARDIOVERSION N/A 12/03/2021   Procedure: CARDIOVERSION;  Surgeon: Skeet Latch, MD;  Location: [facility];  Service: Cardiovascular;  Laterality: N/A;     reports that she quit smoking about 60 years ago. Her smoking use included cigarettes. She has a 0.75 pack-year smoking history. She has never used smokeless tobacco. She reports current alcohol use. She reports that she does not use drugs.  Allergies  Allergen Reactions   Apraclonidine Other (See Comments)    Redness of eye   Iodine Other (See Comments)    Iodine in eye drops - redness of eyes    Family hx: autoimmune disorder  Prior to Admission  medications   Medication Sig Start Date End Date Taking? Authorizing Provider  acetaminophen (TYLENOL) 325 MG tablet Take 325 mg by mouth every 6 (six) hours as needed for mild pain.    [provider]  albuterol (VENTOLIN HFA) 108 (90 Base) MCG/ACT inhaler INHALE 2 PUFFS INTO THE LUNGS EVERY 6 HOURS AS NEEDED FOR WHEEZING/SHORNTESS OF BREATH Patient taking differently: Inhale 2 puffs into the lungs every 6 (six) hours as needed for wheezing or shortness of breath. 05/04/20   Comer, Okey Regal, MD  AZOPT 1 % ophthalmic suspension Place 1 drop into the right eye 2 (two) times daily. 07/20/17   [provider]  Cholecalciferol 50 MCG (2000 UT) CAPS Take 2,000 Units by mouth 3 (three) times a week.    [provider]  dabigatran (PRADAXA) 150 MG CAPS capsule Take 1 capsule (150 mg total) by mouth every 12 (twelve) hours. 03/09/22   Margie Billet, NP  Dextromethorphan HBr (DELSYM PO) Take 10 mLs by mouth at bedtime.    [provider]  digoxin (LANOXIN) 0.125 MG tablet Take 0.5 tablets (0.0625 mg total) by mouth daily. 03/09/22   Margie Billet, NP  furosemide (LASIX) 20 MG tablet Take 1 tablet (20 mg total) by mouth daily. 03/09/22   Margie Billet, NP  latanoprost (XALATAN) 0.005 % ophthalmic solution Place 1 drop into the right eye at bedtime. 04/25/17   [provider]  metoprolol succinate (TOPROL-XL) 50 MG  24 hr tablet Take 50 mg by mouth daily. 03/01/22   [provider]  potassium chloride 20 MEQ/15ML (10%) SOLN Take 15 mLs (20 mEq total) by mouth daily. 03/09/22   Margie Billet, NP  sodium chloride HYPERTONIC 3 % nebulizer solution Take by nebulization daily. Patient taking differently: Take 4 mLs by nebulization daily as needed (shortness of breath). 07/28/21   Rigoberto Noel, MD    Physical Exam: Vitals:   03/10/22 1400 03/10/22 1415 03/10/22 1430 03/10/22 1500  BP: (!) 161/78 (!) 143/99 (!) 153/94 (!) 144/69  Pulse: 83 81 83 84  Resp: (!) 24 (!) 31 (!) 21  18  Temp:      TempSrc:      SpO2: 100% 98% (!) 84% 98%  Weight:      Height:          Constitutional: NAD, calm, comfortable Eyes: PERRL, lids and conjunctivae normal ENMT: Mucous membranes are dry.  Neck: normal, supple, no masses, no thyromegaly Respiratory:  Normal respiratory effort. No accessory muscle use.  Cardiovascular: irregular Abdomen: no tenderness, no masses palpated. Bowel sounds positive.  Musculoskeletal: no clubbing / cyanosis. Normal muscle tone.  Skin:   Neurologic: CN 2-12 grossly intact. Strength 5/5 in all 4.  Psychiatric:  Alert and oriented x 3. But rambles not on topic when asked questions  Labs on Admission: I have personally reviewed following labs and imaging studies  CBC: Recent Labs  Lab 03/05/22 1811 03/06/22 0142 03/07/22 0127 03/09/22 0130 03/10/22 1123  WBC 15.2* 12.7* 13.3* 14.1* 20.6*  NEUTROABS 10.7*  --   --  10.0*  --   HGB 11.7* 10.4* 11.2* 10.3* 11.0*  HCT 35.5* 31.2* 34.2* 30.9* 34.6*  MCV 90.6 89.9 91.7 90.9 91.5  PLT 340 295 336 326 201   Basic Metabolic Panel: Recent Labs  Lab 03/05/22 0437 03/06/22 0142 03/07/22 0127 03/08/22 0051 03/09/22 0130 03/10/22 1123  NA 134* 133* 132* 134* 134* 130*  K 3.8 3.2* 3.7 4.0 3.6 5.0  CL 100 97* 94* 99 101 98  CO2 _0 GLUCOSE 102* 103* 111* 108* 104* 152*  BUN _1 CREATININE 0.85 0.88 0.77 0.72 0.67 0.74  CALCIUM 8.4* 8.1* 8.4* 8.4* 8.1* 8.7*  MG 1.9 1.8 1.8 1.8  --   --    GFR: Estimated Creatinine Clearance: 41 mL/min (by C-G formula based on SCr of 0.74 mg/dL). Liver Function Tests: Recent Labs  Lab 03/10/22 1123  AST 25  ALT 21  ALKPHOS 109  BILITOT 1.6*  PROT 8.3*  ALBUMIN 3.1*   No results for input(s): "LIPASE", "AMYLASE" in the last 168 hours. No results for input(s): "AMMONIA" in the last 168 hours. Coagulation Profile: No results for input(s): "INR", "PROTIME" in the last 168 hours. Cardiac Enzymes: No results for  input(s): "CKTOTAL", "CKMB", "CKMBINDEX", "TROPONINI" in the last 168 hours. BNP (last 3 results) No results for input(s): "PROBNP" in the last 8760 hours. HbA1C: No results for input(s): "HGBA1C" in the last 72 hours. CBG: Recent Labs  Lab 03/10/22 1125  GLUCAP 155*   Lipid Profile: No results for input(s): "CHOL", "HDL", "LDLCALC", "TRIG", "CHOLHDL", "LDLDIRECT" in the last 72 hours. Thyroid Function Tests: Recent Labs    03/08/22 0051  TSH 1.655   Anemia Panel: No results for input(s): "VITAMINB12", "FOLATE", "FERRITIN", "TIBC", "IRON", "RETICCTPCT" in the last 72 hours. Urine analysis:    Component Value Date/Time   COLORURINE AMBER (A)  03/10/2022 1153   APPEARANCEUR HAZY (A) 03/10/2022 1153   LABSPEC 1.029 03/10/2022 1153   PHURINE 5.0 03/10/2022 1153   GLUCOSEU NEGATIVE 03/10/2022 1153   HGBUR LARGE (A) 03/10/2022 1153   BILIRUBINUR NEGATIVE 03/10/2022 [place] 03/10/2022 1153   PROTEINUR 100 (A) 03/10/2022 1153   NITRITE NEGATIVE 03/10/2022 1153   LEUKOCYTESUR MODERATE (A) 03/10/2022 1153     Radiological Exams on Admission: DG Chest Portable 1 View  Result Date: 03/10/2022 CLINICAL DATA:  Altered mental status, history of breast cancer in bronchiectasis EXAM: PORTABLE CHEST 1 VIEW COMPARISON:  Portable exam 1303 hours compared to 03/04/2022 FINDINGS: Normal heart size, mediastinal contours, and pulmonary vascularity. Chronic bronchitic and interstitial changes similar to prior study. No definite acute infiltrate, pleural effusion, or pneumothorax. Bones demineralized. Atherosclerotic calcifications aorta. IMPRESSION: Chronic bronchitic and interstitial changes without acute infiltrate. Aortic Atherosclerosis (ICD10-I70.0). Electronically Signed   By: Lavonia Dana M.D.   On: 03/10/2022 13:13   CT HEAD WO CONTRAST (5MM)  Result Date: 03/10/2022 CLINICAL DATA:  Mental status change, unknown cause EXAM: CT HEAD WITHOUT CONTRAST TECHNIQUE: Contiguous  axial images were obtained from the base of the skull through the vertex without intravenous contrast. RADIATION DOSE REDUCTION: This exam was performed according to the departmental dose-optimization program which includes automated exposure control, adjustment of the mA and/or kV according to patient size and/or use of iterative reconstruction technique. COMPARISON:  None Available. FINDINGS: Brain: There is no acute intracranial hemorrhage, mass effect, or edema. Gray-white differentiation is preserved. There is no extra-axial fluid collection. Prominence of the ventricles and sulci reflects mild parenchymal volume loss. Patchy and confluent hypoattenuation in the supratentorial white matter is nonspecific but may reflect mild to moderate chronic microvascular ischemic changes. Vascular: There is atherosclerotic calcification at the skull base. Skull: Calvarium is unremarkable. Sinuses/Orbits: No acute finding. Other: None. IMPRESSION: No acute intracranial abnormality. Chronic microvascular ischemic changes. Electronically Signed   By: Macy Mis M.D.   On: 03/10/2022 12:35      Assessment/Plan Principal Problem:   UTI (urinary tract infection) Active Problems:   Bronchiectasis without complication (HCC)   Palpable purpura (HCC)   Cellulitis   Confusion   Confusion/AMS -? UTI/pyelo vs cellulitis -given abx in the ER -continue rocephin 2 grams daily -urine culture pending -if continues can consider MRI to r/o CVA given change of blood thinner and recent cardioversion although timing of symptoms seems delayed- CT head normal -delirium precautions  Persistent atrial fibrillation: TSH 1.655.  - s/p DCCV 6/27 AM.  - Continue metoprolol - Continue digoxin- check level - Continue anticoagulation with dabigatran since blood counts are stable and CHA2DS2-VASc score is 22 (age x2, sex)   - Keep K and Mg repleted    Hyponatremia -? Dehydration -gentle IVF -labs in AM  chronic HFpEF,  moderate MR: -appears dry -gentle IVF overnight  Neutrophilic dermatosis:  -skin biopsy left wrist lesion 6/19 by dermatology, Dr. Rozann Lesches. Ulcerating lesions become painful after gradual enlargement and coalescence. Nonpruritic. ITP, TTP, DIC, anti-phospholipid syndrome unlikely. Platelet count and coagulation studies are normal. Unclear precipitant in this case, though inflammatory markers including RF are elevated and there is +FH of autoimmune disorders. Oddly despite hx +ANA, is negative on last check. However, would be odd to manifest so late in life and has no hx of same, no arthralgias, no typical Sjogren-type symptoms, or ocular involvement. Does not seem to be complement-mediated. Cryoglobulinemia less like, hepatitis panel negative.  - Keep follow up appointment  with rheumatology, Dr. Dossie Der, on 7/10. Unfortunately, no consultative services are available at this hospital.  - Removed possible precipitant medications (eliquis, diltiazem).   Neutrophilic leukocytosis:  -see above re: infection   Microscopic hematuria:  -? UTI/pyelonephritis -CT abd/pelvis performed last admission without mass or stone. Possibility of "mild perinephric stranding perhaps slightly worse on the right than the left" is mentioned in radiology report. - prior MD Dr. Bonner Puna spoke with urology, Dr. Tresa Moore who personally reviewed the images and feels no further work up or treatment should be necessary. Patient is urged to follow up with [facility] (known to the practice s/p bladder tack remotely) for consideration of cystoscopy.    History of MAI s/p 6 months Tx and resultant extensive bronchiectasis: AFB Cx, smear negative July 2022. CXR 6/23 showed RUL scarring without consolidation or pulmonary edema. No pulmonary complaints at this time. - Not currently symptomatic - Continue delsym, prn albuterol.    History of left breast CA s/p XRT, chemotherapy   Advanced age: Pt presented from [facility].   -family working to get her into ALF   Glaucoma: Quies[place].  - Continue drops        DVT prophylaxis: pradaxa  Code Status: partial  Family Communication: at bedside    Admission status: obs   At the point of initial evaluation, it is my clinical opinion that admission for OBSERVATION is reasonable and necessary because the patient's presenting complaints in the context of their chronic conditions represent sufficient risk of deterioration or significant morbidity to constitute reasonable grounds for close observation in the hospital setting, but that the patient may be medically stable for discharge from the hospital within 24 to 48 hours.     Geradine Girt [facility]   How to contact the [facility] Attending or Consulting provider [place] or covering provider during after hours [place], for this patient?  Check the care team in [facility] and look for a) attending/consulting [facility] provider listed and b) the [facility] team listed Log into [email or web address] and use [place]'s universal password to access. If you do not have the password, please contact the hospital operator. Locate the [facility] provider you are looking for under [facility] and page to a number that you can be directly reached. If you still have difficulty reaching the provider, please page the [facility] (Director on Call) for the Hospitalists listed on amion for assistance.  03/10/2022, 5:01 PM

## 2022-03-11 ENCOUNTER — Encounter (HOSPITAL_COMMUNITY): Payer: Self-pay | Admitting: Internal Medicine

## 2022-03-11 DIAGNOSIS — D65 Disseminated intravascular coagulation [defibrination syndrome]: Secondary | ICD-10-CM | POA: Diagnosis present

## 2022-03-11 DIAGNOSIS — Z79899 Other long term (current) drug therapy: Secondary | ICD-10-CM | POA: Diagnosis not present

## 2022-03-11 DIAGNOSIS — Z87891 Personal history of nicotine dependence: Secondary | ICD-10-CM | POA: Diagnosis not present

## 2022-03-11 DIAGNOSIS — L03115 Cellulitis of right lower limb: Secondary | ICD-10-CM | POA: Diagnosis present

## 2022-03-11 DIAGNOSIS — I4819 Other persistent atrial fibrillation: Secondary | ICD-10-CM | POA: Diagnosis present

## 2022-03-11 DIAGNOSIS — Z888 Allergy status to other drugs, medicaments and biological substances status: Secondary | ICD-10-CM | POA: Diagnosis not present

## 2022-03-11 DIAGNOSIS — B952 Enterococcus as the cause of diseases classified elsewhere: Secondary | ICD-10-CM | POA: Diagnosis present

## 2022-03-11 DIAGNOSIS — Z853 Personal history of malignant neoplasm of breast: Secondary | ICD-10-CM | POA: Diagnosis not present

## 2022-03-11 DIAGNOSIS — H409 Unspecified glaucoma: Secondary | ICD-10-CM | POA: Diagnosis present

## 2022-03-11 DIAGNOSIS — D75839 Thrombocytosis, unspecified: Secondary | ICD-10-CM | POA: Diagnosis present

## 2022-03-11 DIAGNOSIS — E8809 Other disorders of plasma-protein metabolism, not elsewhere classified: Secondary | ICD-10-CM | POA: Diagnosis present

## 2022-03-11 DIAGNOSIS — Z923 Personal history of irradiation: Secondary | ICD-10-CM | POA: Diagnosis not present

## 2022-03-11 DIAGNOSIS — R3129 Other microscopic hematuria: Secondary | ICD-10-CM | POA: Diagnosis present

## 2022-03-11 DIAGNOSIS — E860 Dehydration: Secondary | ICD-10-CM | POA: Diagnosis present

## 2022-03-11 DIAGNOSIS — J479 Bronchiectasis, uncomplicated: Secondary | ICD-10-CM | POA: Diagnosis present

## 2022-03-11 DIAGNOSIS — R21 Rash and other nonspecific skin eruption: Secondary | ICD-10-CM | POA: Diagnosis present

## 2022-03-11 DIAGNOSIS — R410 Disorientation, unspecified: Secondary | ICD-10-CM | POA: Diagnosis not present

## 2022-03-11 DIAGNOSIS — D692 Other nonthrombocytopenic purpura: Secondary | ICD-10-CM | POA: Diagnosis not present

## 2022-03-11 DIAGNOSIS — L0390 Cellulitis, unspecified: Secondary | ICD-10-CM | POA: Diagnosis not present

## 2022-03-11 DIAGNOSIS — K140 Glossitis: Secondary | ICD-10-CM | POA: Diagnosis present

## 2022-03-11 DIAGNOSIS — I5032 Chronic diastolic (congestive) heart failure: Secondary | ICD-10-CM | POA: Diagnosis present

## 2022-03-11 DIAGNOSIS — E871 Hypo-osmolality and hyponatremia: Secondary | ICD-10-CM | POA: Diagnosis present

## 2022-03-11 DIAGNOSIS — G9341 Metabolic encephalopathy: Secondary | ICD-10-CM | POA: Diagnosis present

## 2022-03-11 DIAGNOSIS — L03116 Cellulitis of left lower limb: Secondary | ICD-10-CM | POA: Diagnosis present

## 2022-03-11 DIAGNOSIS — D72828 Other elevated white blood cell count: Secondary | ICD-10-CM | POA: Diagnosis present

## 2022-03-11 DIAGNOSIS — N390 Urinary tract infection, site not specified: Secondary | ICD-10-CM | POA: Diagnosis present

## 2022-03-11 DIAGNOSIS — Z7901 Long term (current) use of anticoagulants: Secondary | ICD-10-CM | POA: Diagnosis not present

## 2022-03-11 DIAGNOSIS — N3001 Acute cystitis with hematuria: Secondary | ICD-10-CM | POA: Diagnosis not present

## 2022-03-11 LAB — COMPREHENSIVE METABOLIC PANEL
ALT: 19 U/L (ref 0–44)
AST: 21 U/L (ref 15–41)
Albumin: 2.4 g/dL — ABNORMAL LOW (ref 3.5–5.0)
Alkaline Phosphatase: 92 U/L (ref 38–126)
Anion gap: 6 (ref 5–15)
BUN: 15 mg/dL (ref 8–23)
CO2: 25 mmol/L (ref 22–32)
Calcium: 8.3 mg/dL — ABNORMAL LOW (ref 8.9–10.3)
Chloride: 100 mmol/L (ref 98–111)
Creatinine, Ser: 0.74 mg/dL (ref 0.44–1.00)
GFR, Estimated: >60 mL/min (ref >60)
Glucose, Bld: 94 mg/dL (ref 70–99)
Potassium: 4.2 mmol/L (ref 3.5–5.1)
Sodium: 131 mmol/L — ABNORMAL LOW (ref 135–145)
Total Bilirubin: 1.3 mg/dL — ABNORMAL HIGH (ref 0.3–1.2)
Total Protein: 6.7 g/dL (ref 6.5–8.1)

## 2022-03-11 LAB — CBC
HCT: 33.7 % — ABNORMAL LOW (ref 36.0–46.0)
Hemoglobin: 10.7 g/dL — ABNORMAL LOW (ref 12.0–15.0)
MCH: 29.1 pg (ref 26.0–34.0)
MCHC: 31.8 g/dL (ref 30.0–36.0)
MCV: 91.6 fL (ref 80.0–100.0)
Platelets: 350 10*3/uL (ref 150–400)
RBC: 3.68 MIL/uL — ABNORMAL LOW (ref 3.87–5.11)
RDW: 13.4 % (ref 11.5–15.5)
WBC: 19.6 10*3/uL — ABNORMAL HIGH (ref 4.0–10.5)
nRBC: 0 % (ref 0.0–0.2)

## 2022-03-11 LAB — DIGOXIN LEVEL: Digoxin Level: 0.5 ng/mL — ABNORMAL LOW (ref 0.8–2.0)

## 2022-03-11 LAB — MAGNESIUM: Magnesium: 2 mg/dL (ref 1.7–2.4)

## 2022-03-11 MED ORDER — HYDROCODONE-ACETAMINOPHEN 5-325 MG PO TABS
1.0000 | ORAL_TABLET | Freq: Four times a day (QID) | ORAL | Status: DC | PRN
  Administered 2022-03-15 – 2022-03-17 (×3): 1 via ORAL
  Filled 2022-03-11 (×3): qty 1

## 2022-03-11 NOTE — Progress Notes (Signed)
PROGRESS NOTE    [name] Rice  VOZ:[id number] DOB: November 27, 1940 DOA: 03/10/2022 PCP: Lavone Orn, MD    Brief Narrative:   [name] Rice is a 81 y.o. female with medical history significant of breast cancer, bronchiectasis, a fib with recent cardioversion.  She was discharged on 6/28 after cardioversion and developed mental status change per daughter.  She went back to her ALF and became confused and was hallucinating- started gradually but worsened around 8 PM.  Daughter also reports that her legs are more swollen, painful and erythematous.     In the ER, U/A was obtained that was suggestive of an infection and there was concern for cellulitis.   Assessment and Plan: Confusion/AMS -? UTI/pyelo vs cellulitis -given abx in the ER -continue rocephin 2 grams daily -urine culture pending -improved in AM of 6/30-- back to baseline per family -delirium precautions   Persistent atrial fibrillation: TSH 1.655.  - s/p DCCV 6/27 AM.  - Continue metoprolol - Continue digoxin- level low - Continue anticoagulation with dabigatran since blood counts are stable and CHA2DS2-VASc score is 84 (age x2, sex)   - Keep K and Mg repleted    Hyponatremia -? Dehydration -gentle IVF overnight-- eating better so trend daily -labs in AM   chronic HFpEF, moderate MR: -appears dry -gentle IVF overnight   Neutrophilic dermatosis:  -skin biopsy left wrist lesion 6/19 by dermatology, Dr. Rozann Lesches. Ulcerating lesions become painful after gradual enlargement and coalescence. Nonpruritic. ITP, TTP, DIC, anti-phospholipid syndrome unlikely. Platelet count and coagulation studies are normal. Unclear precipitant in this case, though inflammatory markers including RF are elevated and there is +FH of autoimmune disorders. Oddly despite hx +ANA, is negative on last check. However, would be odd to manifest so late in life and has no hx of same, no arthralgias, no typical Sjogren-type symptoms, or ocular  involvement. Does not seem to be complement-mediated. Cryoglobulinemia less like, hepatitis panel negative.  -local wound care - Keep follow up appointment with rheumatology, Dr. D[place]ie Der, on 7/10. Unfortunately, no consultative services are available at this hospital.  - Removed possible precipitant medications (eliquis, diltiazem). -added pain meds   Neutrophilic leukocytosis:  -see above re: infection   Microscopic hematuria:  -? UTI/pyelonephritis -CT abd/pelvis performed last admission without mass or stone. Possibility of "mild perinephric stranding perhaps slightly worse on the right than the left" is mentioned in radiology report. - prior MD Dr. Bonner Puna spoke with urology, Dr. Tresa Moore who personally reviewed the images and feels no further work up or treatment should be necessary. Patient is urged to follow up with [facility] (known to the practice s/p bladder tack remotely) for consideration of cystoscopy.    History of MAI s/p 6 months Tx and resultant extensive bronchiectasis: AFB Cx, smear negative July 2022. CXR 6/23 showed RUL scarring without consolidation or pulmonary edema. No pulmonary complaints at this time. - Not currently symptomatic - Continue delsym, prn albuterol.    History of left breast CA s/p XRT, chemotherapy   Advanced age: Pt presented from [place].  -family working to get her into ALF   Glaucoma: Quiescent.  - Continue drops   DVT prophylaxis:  dabigatran (PRADAXA) capsule 150 mg    Code Status: Partial Code Family Communication:   Disposition Plan:  Level of care: Telemetry Cardiac Status is: Inpatient Remains inpatient appropriate because: needs IV abx    Consultants:  none   Subjective: Pain in legs when standing  Objective: Vitals:   03/10/22 2335 03/11/22  0306 03/11/22 0805 03/11/22 1139  BP: (!) 102/48 109/63 136/67 (!) 119/53  Pulse: 64 75 79 81  Resp: _0 Temp: 98.3 F (36.8 C) 98.7 F (37.1 C) 97.9 F (36.6  C)   TempSrc: Oral Oral    SpO2: 96% 95% 98% 95%  Weight:      Height:        Intake/Output Summary (Last 24 hours) at 03/11/2022 1340 Last data filed at 03/10/2022 1706 Gross per 24 hour  Intake 100.42 ml  Output --  Net 100.42 ml   Filed Weights   03/10/22 1258  Weight: 48.7 kg    Examination:   General: Appearance:    Thin female in no acute distress     Lungs:     respirations unlabored  Heart:    Normal heart rate.    MS:   All extremities are intact.    Neurologic:   Awake, alert, oriented x 3       Data Reviewed: I have personally reviewed following labs and imaging studies  CBC: Recent Labs  Lab 03/05/22 1811 03/06/22 0142 03/07/22 0127 03/09/22 0130 03/10/22 1123 03/11/22 0231  WBC 15.2* 12.7* 13.3* 14.1* 20.6* 19.6*  NEUTROABS 10.7*  --   --  10.0*  --   --   HGB 11.7* 10.4* 11.2* 10.3* 11.0* 10.7*  HCT 35.5* 31.2* 34.2* 30.9* 34.6* 33.7*  MCV 90.6 89.9 91.7 90.9 91.5 91.6  PLT 340 295 336 326 390 242   Basic Metabolic Panel: Recent Labs  Lab 03/05/22 0437 03/06/22 0142 03/07/22 0127 03/08/22 0051 03/09/22 0130 03/10/22 1123 03/11/22 0231  NA 134* 133* 132* 134* 134* 130* 131*  K 3.8 3.2* 3.7 4.0 3.6 5.0 4.2  CL 100 97* 94* 99 101 98 100  CO2 _1 GLUCOSE 102* 103* 111* 108* 104* 152* 94  BUN _2 CREATININE 0.85 0.88 0.77 0.72 0.67 0.74 0.74  CALCIUM 8.4* 8.1* 8.4* 8.4* 8.1* 8.7* 8.3*  MG 1.9 1.8 1.8 1.8  --   --  2.0   GFR: Estimated Creatinine Clearance: 41 mL/min (by C-G formula based on SCr of 0.74 mg/dL). Liver Function Tests: Recent Labs  Lab 03/10/22 1123 03/11/22 0231  AST 25 21  ALT 21 19  ALKPHOS 109 92  BILITOT 1.6* 1.3*  PROT 8.3* 6.7  ALBUMIN 3.1* 2.4*   No results for input(s): "LIPASE", "AMYLASE" in the last 168 hours. No results for input(s): "AMMONIA" in the last 168 hours. Coagulation Profile: No results for input(s): "INR", "PROTIME" in the last 168 hours. Cardiac  Enzymes: No results for input(s): "CKTOTAL", "CKMB", "CKMBINDEX", "TROPONINI" in the last 168 hours. BNP (last 3 results) No results for input(s): "PROBNP" in the last 8760 hours. HbA1C: No results for input(s): "HGBA1C" in the last 72 hours. CBG: Recent Labs  Lab 03/10/22 1125  GLUCAP 155*   Lipid Profile: No results for input(s): "CHOL", "HDL", "LDLCALC", "TRIG", "CHOLHDL", "LDLDIRECT" in the last 72 hours. Thyroid Function Tests: No results for input(s): "TSH", "T4TOTAL", "FREET4", "T3FREE", "THYROIDAB" in the last 72 hours. Anemia Panel: No results for input(s): "VITAMINB12", "FOLATE", "FERRITIN", "TIBC", "IRON", "RETICCTPCT" in the last 72 hours. Sepsis Labs: No results for input(s): "PROCALCITON", "LATICACIDVEN" in the last 168 hours.  Recent Results (from the past 240 hour(s))  Urine Culture     Status: None (Preliminary result)   Collection Time: 03/10/22  3:44 PM   Specimen:  In/Out Cath Urine  Result Value Ref Range Status   Specimen Description IN/OUT CATH URINE  Final   Special Requests NONE  Final   Culture   Final    CULTURE REINCUBATED FOR BETTER GROWTH Performed at [facility], [address] [place], [place] [address]    Report Status PENDING  Incomplete         Radiology Studies: DG Chest Portable 1 View  Result Date: 03/10/2022 CLINICAL DATA:  Altered mental status, history of breast cancer in bronchiectasis EXAM: PORTABLE CHEST 1 VIEW COMPARISON:  Portable exam 1303 hours compared to 03/04/2022 FINDINGS: Normal heart size, mediastinal contours, and pulmonary vascularity. Chronic bronchitic and interstitial changes similar to prior study. No definite acute infiltrate, pleural effusion, or pneumothorax. Bones demineralized. Atherosclerotic calcifications aorta. IMPRESSION: Chronic bronchitic and interstitial changes without acute infiltrate. Aortic Atherosclerosis (ICD10-I70.0). Electronically Signed   By: Lavonia Dana M.D.   On: 03/10/2022 13:13    CT HEAD WO CONTRAST (5MM)  Result Date: 03/10/2022 CLINICAL DATA:  Mental status change, unknown cause EXAM: CT HEAD WITHOUT CONTRAST TECHNIQUE: Contiguous axial images were obtained from the base of the skull through the vertex without intravenous contrast. RADIATION DOSE REDUCTION: This exam was performed according to the departmental dose-optimization program which includes automated exposure control, adjustment of the mA and/or kV according to patient size and/or use of iterative reconstruction technique. COMPARISON:  None Available. FINDINGS: Brain: There is no acute intracranial hemorrhage, mass effect, or edema. Gray-white differentiation is preserved. There is no extra-axial fluid collection. Prominence of the ventricles and sulci reflects mild parenchymal volume loss. Patchy and confluent hypoattenuation in the supratentorial white matter is nonspecific but may reflect mild to moderate chronic microvascular ischemic changes. Vascular: There is atherosclerotic calcification at the skull base. Skull: Calvarium is unremarkable. Sinuses/Orbits: No acute finding. Other: None. IMPRESSION: No acute intracranial abnormality. Chronic microvascular ischemic changes. Electronically Signed   By: Macy Mis M.D.   On: 03/10/2022 12:35        Scheduled Meds:  brinzolamide  1 drop Right Eye BID   cholecalciferol  2,000 Units Oral Once per day on Mon Wed Fri   dabigatran  150 mg Oral Q12H   digoxin  0.0625 mg Oral Daily   latanoprost  1 drop Right Eye QHS   metoprolol succinate  50 mg Oral Daily   Continuous Infusions:  cefTRIAXone (ROCEPHIN)  IV       LOS: 0 days    Time spent: 45 minutes spent on chart review, discussion with nursing staff, consultants, updating family and interview/physical exam; more than 50% of that time was spent in counseling and/or coordination of care.    Geradine Girt, DO [facility] Available via Epic secure chat 7am-7pm After these hours, please  refer to coverage provider listed on [email or web address] 03/11/2022, 1:40 PM

## 2022-03-12 DIAGNOSIS — N3001 Acute cystitis with hematuria: Secondary | ICD-10-CM | POA: Diagnosis not present

## 2022-03-12 LAB — CBC WITH DIFFERENTIAL/PLATELET
Abs Immature Granulocytes: 0.15 10*3/uL — ABNORMAL HIGH (ref 0.00–0.07)
Basophils Absolute: 0 10*3/uL (ref 0.0–0.1)
Basophils Relative: 0 %
Eosinophils Absolute: 0.1 10*3/uL (ref 0.0–0.5)
Eosinophils Relative: 0 %
HCT: 31.3 % — ABNORMAL LOW (ref 36.0–46.0)
Hemoglobin: 10.1 g/dL — ABNORMAL LOW (ref 12.0–15.0)
Immature Granulocytes: 1 %
Lymphocytes Relative: 13 %
Lymphs Abs: 2.1 10*3/uL (ref 0.7–4.0)
MCH: 29.1 pg (ref 26.0–34.0)
MCHC: 32.3 g/dL (ref 30.0–36.0)
MCV: 90.2 fL (ref 80.0–100.0)
Monocytes Absolute: 1.2 10*3/uL — ABNORMAL HIGH (ref 0.1–1.0)
Monocytes Relative: 8 %
Neutro Abs: 12.8 10*3/uL — ABNORMAL HIGH (ref 1.7–7.7)
Neutrophils Relative %: 78 %
Platelets: 371 10*3/uL (ref 150–400)
RBC: 3.47 MIL/uL — ABNORMAL LOW (ref 3.87–5.11)
RDW: 13.5 % (ref 11.5–15.5)
WBC: 16.4 10*3/uL — ABNORMAL HIGH (ref 4.0–10.5)
nRBC: 0 % (ref 0.0–0.2)

## 2022-03-12 LAB — BASIC METABOLIC PANEL
Anion gap: 12 (ref 5–15)
BUN: 9 mg/dL (ref 8–23)
CO2: 25 mmol/L (ref 22–32)
Calcium: 8.6 mg/dL — ABNORMAL LOW (ref 8.9–10.3)
Chloride: 99 mmol/L (ref 98–111)
Creatinine, Ser: 0.7 mg/dL (ref 0.44–1.00)
GFR, Estimated: >60 mL/min (ref >60)
Glucose, Bld: 93 mg/dL (ref 70–99)
Potassium: 4.2 mmol/L (ref 3.5–5.1)
Sodium: 136 mmol/L (ref 135–145)

## 2022-03-12 LAB — URINE CULTURE: Culture: 20000 — AB

## 2022-03-12 NOTE — Progress Notes (Signed)
PROGRESS NOTE    [name] Rice  HAL:[id number] DOB: 1941/05/18 DOA: 03/10/2022 PCP: Lavone Orn, MD    Brief Narrative:   [name] Rice is a 81 y.o. female with medical history significant of breast cancer, bronchiectasis, a fib with recent cardioversion.  She was discharged on 6/28 after cardioversion and developed mental status change per daughter.  She went back to her ALF and became confused and was hallucinating- started gradually but worsened around 8 PM.  Daughter also reports that her legs are more swollen, painful and erythematous.     In the ER, U/A was obtained that was suggestive of an infection and there was concern for cellulitis.   Assessment and Plan: Confusion/AMS -more consistent with cellulitis -given abx in the ER -continue rocephin 2 grams daily -urine culture <20,000 -improved in AM of 6/30-- back to baseline per family -delirium precautions   Persistent atrial fibrillation: TSH 1.655.  - s/p DCCV 6/27 AM.  - Continue metoprolol - Continue digoxin- level low - Continue anticoagulation with dabigatran since blood counts are stable and CHA2DS2-VASc score is 3 (age x2, sex)   - Keep K and Mg repleted    Hyponatremia -? Dehydration resolved   chronic HFpEF, moderate MR: stable   Neutrophilic dermatosis:  -skin biopsy left wrist lesion 6/19 by dermatology, Dr. Rozann Lesches. Ulcerating lesions become painful after gradual enlargement and coalescence. Nonpruritic. ITP, TTP, DIC, anti-phospholipid syndrome unlikely. Platelet count and coagulation studies are normal. Unclear precipitant in this case, though inflammatory markers including RF are elevated and there is +FH of autoimmune disorders. Oddly despite hx +ANA, is negative on last check. However, would be odd to manifest so late in life and has no hx of same, no arthralgias, no typical Sjogren-type symptoms, or ocular involvement. Does not seem to be complement-mediated. Cryoglobulinemia less like,  hepatitis panel negative.  -local wound care - Keep follow up appointment with rheumatology, Dr. D[place]ie Der, on 7/10. Unfortunately, no consultative services are available at this hospital.  - Removed possible precipitant medications (eliquis, diltiazem). -added pain meds for ambulation -nursing to page me prior to next dressing change in the AM   Neutrophilic leukocytosis:  -see above re: infection   Microscopic hematuria:  -CT abd/pelvis performed last admission without mass or stone. Possibility of "mild perinephric stranding perhaps slightly worse on the right than the left" is mentioned in radiology report. - prior MD Dr. Bonner Puna spoke with urology, Dr. Tresa Moore who personally reviewed the images and feels no further work up or treatment should be necessary. Patient is urged to follow up with [facility] (known to the practice s/p bladder tack remotely) for consideration of cystoscopy.    History of MAI s/p 6 months Tx and resultant extensive bronchiectasis: AFB Cx, smear negative July 2022. CXR 6/23 showed RUL scarring without consolidation or pulmonary edema. No pulmonary complaints at this time. - Not currently symptomatic - Continue delsym, prn albuterol.    History of left breast CA s/p XRT, chemotherapy   Advanced age: Pt presented from [place].  -family working to get her into ALF   Glaucoma: Quiescent.  - Continue drops   DVT prophylaxis:  dabigatran (PRADAXA) capsule 150 mg    Code Status: Partial Code Family Communication: daughter at bedside 6/30  Disposition Plan:  Level of care: Telemetry Cardiac Status is: Inpatient Remains inpatient appropriate because: needs IV abx    Consultants:  none   Subjective: Still having pain with standing but has not taken a pain pill  yet  Objective: Vitals:   03/11/22 1748 03/11/22 1959 03/11/22 2338 03/12/22 0700  BP: (!) 125/54 (!) 108/47 128/63 130/65  Pulse: 82 84 89 89  Resp: _0 Temp: 98.2 F (36.8  C) 98.1 F (36.7 C) 97.8 F (36.6 C) 98.3 F (36.8 C)  TempSrc: Oral Oral  Oral  SpO2: 99% 95% 97% 98%  Weight:      Height:        Intake/Output Summary (Last 24 hours) at 03/12/2022 1257 Last data filed at 03/11/2022 1600 Gross per 24 hour  Intake 100 ml  Output --  Net 100 ml   Filed Weights   03/10/22 1258  Weight: 48.7 kg    Examination:   General: Appearance:    Thin female in no acute distress     Lungs:     respirations unlabored  Heart:    Normal heart rate. Irregularly irregular rhythm. No murmurs, rubs, or gallops.   MS:   All extremities are intact.  B/l leg wrapped  Neurologic:   Awake, alert, oriented x 3. No apparent focal neurological           defect.          Data Reviewed: I have personally reviewed following labs and imaging studies  CBC: Recent Labs  Lab 03/05/22 1811 03/06/22 0142 03/07/22 0127 03/09/22 0130 03/10/22 1123 03/11/22 0231 03/12/22 0321  WBC 15.2*   < > 13.3* 14.1* 20.6* 19.6* 16.4*  NEUTROABS 10.7*  --   --  10.0*  --   --  12.8*  HGB 11.7*   < > 11.2* 10.3* 11.0* 10.7* 10.1*  HCT 35.5*   < > 34.2* 30.9* 34.6* 33.7* 31.3*  MCV 90.6   < > 91.7 90.9 91.5 91.6 90.2  PLT 340   < > 336 326 390 350 371   < > = values in this interval not displayed.   Basic Metabolic Panel: Recent Labs  Lab 03/06/22 0142 03/07/22 0127 03/08/22 0051 03/09/22 0130 03/10/22 1123 03/11/22 0231 03/12/22 0321  NA 133* 132* 134* 134* 130* 131* 136  K 3.2* 3.7 4.0 3.6 5.0 4.2 4.2  CL 97* 94* 99 101 98 100 99  CO2 _1 GLUCOSE 103* 111* 108* 104* 152* 94 93  BUN _2 CREATININE 0.88 0.77 0.72 0.67 0.74 0.74 0.70  CALCIUM 8.1* 8.4* 8.4* 8.1* 8.7* 8.3* 8.6*  MG 1.8 1.8 1.8  --   --  2.0  --    GFR: Estimated Creatinine Clearance: 41 mL/min (by C-G formula based on SCr of 0.7 mg/dL). Liver Function Tests: Recent Labs  Lab 03/10/22 1123 03/11/22 0231  AST 25 21  ALT 21 19  ALKPHOS 109 92  BILITOT 1.6*  1.3*  PROT 8.3* 6.7  ALBUMIN 3.1* 2.4*   No results for input(s): "LIPASE", "AMYLASE" in the last 168 hours. No results for input(s): "AMMONIA" in the last 168 hours. Coagulation Profile: No results for input(s): "INR", "PROTIME" in the last 168 hours. Cardiac Enzymes: No results for input(s): "CKTOTAL", "CKMB", "CKMBINDEX", "TROPONINI" in the last 168 hours. BNP (last 3 results) No results for input(s): "PROBNP" in the last 8760 hours. HbA1C: No results for input(s): "HGBA1C" in the last 72 hours. CBG: Recent Labs  Lab 03/10/22 1125  GLUCAP 155*   Lipid Profile: No results for input(s): "CHOL", "HDL", "LDLCALC", "TRIG", "CHOLHDL", "LDLDIRECT" in the last 72 hours. Thyroid Function  Tests: No results for input(s): "TSH", "T4TOTAL", "FREET4", "T3FREE", "THYROIDAB" in the last 72 hours. Anemia Panel: No results for input(s): "VITAMINB12", "FOLATE", "FERRITIN", "TIBC", "IRON", "RETICCTPCT" in the last 72 hours. Sepsis Labs: No results for input(s): "PROCALCITON", "LATICACIDVEN" in the last 168 hours.  Recent Results (from the past 240 hour(s))  Urine Culture     Status: Abnormal   Collection Time: 03/10/22  3:44 PM   Specimen: In/Out Cath Urine  Result Value Ref Range Status   Specimen Description IN/OUT CATH URINE  Final   Special Requests   Final    NONE Performed at [facility], [address], [place], [place] [address]    Culture 20,000 COLONIES/mL ENTEROCOCCUS FAECALIS (A)  Final   Report Status 03/12/2022 FINAL  Final   Organism ID, Bacteria ENTEROCOCCUS FAECALIS (A)  Final      Susceptibility   Enterococcus faecalis - MIC*    AMPICILLIN <=2 SENSITIVE Sensitive     NITROFURANTOIN <=16 SENSITIVE Sensitive     VANCOMYCIN 1 SENSITIVE Sensitive     * 20,000 COLONIES/mL ENTEROCOCCUS FAECALIS         Radiology Studies: DG Chest Portable 1 View  Result Date: 03/10/2022 CLINICAL DATA:  Altered mental status, history of breast cancer in bronchiectasis EXAM:  PORTABLE CHEST 1 VIEW COMPARISON:  Portable exam 1303 hours compared to 03/04/2022 FINDINGS: Normal heart size, mediastinal contours, and pulmonary vascularity. Chronic bronchitic and interstitial changes similar to prior study. No definite acute infiltrate, pleural effusion, or pneumothorax. Bones demineralized. Atherosclerotic calcifications aorta. IMPRESSION: Chronic bronchitic and interstitial changes without acute infiltrate. Aortic Atherosclerosis (ICD10-I70.0). Electronically Signed   By: Lavonia Dana M.D.   On: 03/10/2022 13:13        Scheduled Meds:  brinzolamide  1 drop Right Eye BID   cholecalciferol  2,000 Units Oral Once per day on Mon Wed Fri   dabigatran  150 mg Oral Q12H   digoxin  0.0625 mg Oral Daily   latanoprost  1 drop Right Eye QHS   metoprolol succinate  50 mg Oral Daily   Continuous Infusions:  cefTRIAXone (ROCEPHIN)  IV 2 g (03/11/22 1452)     LOS: 1 day    Time spent: 35 minutes spent on chart review, discussion with nursing staff, consultants, updating family and interview/physical exam; more than 50% of that time was spent in counseling and/or coordination of care.    Geradine Girt, DO [facility] Available via Epic secure chat 7am-7pm After these hours, please refer to coverage provider listed on [email or web address] 03/12/2022, 12:57 PM

## 2022-03-12 NOTE — Progress Notes (Signed)
Patient request dressing change be done tomorrow morning instead of today. MD would like to be notified when dressing change is done.

## 2022-03-12 NOTE — Evaluation (Signed)
Physical Therapy Evaluation  Patient Details Name: [name] Rice MRN: [id number] DOB: [birth year] Today's Date: 03/12/2022  History of Present Illness  Pt is an 81 y/o female who presents back to the ED 03/10/22 after cardioversion where she was discharged on 6/28 back to her ALF, and developed AMS and LE swelling/pain. Concern for cellulitis, and UA suggestive of UTI. PMH significant for bronchiectasis, breast CA in remission, glaucoma.   Clinical Impression  Pt admitted with above diagnosis. Pt currently with functional limitations due to the deficits listed below (see PT Problem List). At the time of PT eval pt was unable to ambulate, requiring heavy mod assist for basic transfers. Anticipate as cognition clears and pain is controlled, pt will progress well. Based on performance today, recommend SNF level rehab to maximize functional independence, decrease risk for falls, and return to PLOF. Acutely, pt will benefit from skilled PT to increase their independence and safety with mobility to allow discharge to the venue listed below.          Recommendations for follow up therapy are one component of a multi-disciplinary discharge planning process, led by the attending physician.  Recommendations may be updated based on patient status, additional functional criteria and insurance authorization.  Follow Up Recommendations Skilled nursing-short term rehab (<3 hours/day) Can patient physically be transported by private vehicle: No    Assistance Recommended at Discharge Frequent or constant Supervision/Assistance  Patient can return home with the following  Two people to help with walking and/or transfers;A lot of help with bathing/dressing/bathroom;Assistance with cooking/housework;Direct supervision/assist for medications management;Direct supervision/assist for financial management;Assist for transportation    Equipment Recommendations Rolling walker (2 wheels);BSC/3in1 (Unsure if pt has either  of these. She was not able to recall.)  Recommendations for Other Services       Functional Status Assessment Patient has had a recent decline in their functional status and demonstrates the ability to make significant improvements in function in a reasonable and predictable amount of time.     Precautions / Restrictions Precautions Precautions: Fall Restrictions Weight Bearing Restrictions: No      Mobility  Bed Mobility Overal bed mobility: Modified Independent             General bed mobility comments: HOB elevated and increased time required. No assist required    Transfers Overall transfer level: Needs assistance Equipment used: Rolling walker (2 wheels) Transfers: Sit to/from Stand, Bed to chair/wheelchair/BSC Sit to Stand: Mod assist Stand pivot transfers: Mod assist         General transfer comment: Heavy mod assist to power up to full stand and take pivotal steps around to the chair. Approaching max assist at times. Pt unable to dorsiflex ankles enough to get heel down on floor bilatearlly. RN present to apply sacral foam as noted pt with blanchable redness on sacrum upon stand, and RN assisted in removing BSC and replacing recliner behind pt at end of session.    Ambulation/Gait               General Gait Details: Unable to progress to gait training.  Stairs            Wheelchair Mobility    Modified Rankin (Stroke Patients Only)       Balance Overall balance assessment: Needs assistance Sitting-balance support: Feet supported, No upper extremity supported Sitting balance-Leahy Scale: Good     Standing balance support: Bilateral upper extremity supported, During functional activity, Reliant on assistive device for balance Standing balance-Leahy  Scale: Poor Standing balance comment: Heaily reliant on RW for support                             Pertinent Vitals/Pain Pain Assessment Pain Assessment: 0-10 Pain Score: 8   Pain Location: B lower legs Pain Descriptors / Indicators: Discomfort, Sore, Guarding, Grimacing Pain Intervention(s): Limited activity within patient's tolerance, Monitored during session, Repositioned    Home Living Family/patient expects to be discharged to::  (Independent living)                   Additional Comments: Pt has been at [facility] in independent living. Her husband has been moved to ALF while she was admitted recently.    Prior Function Prior Level of Function : Independent/Modified Independent;Driving             Mobility Comments: using the RW at home for the 1 day prior to coming back to the hospital. ADLs Comments: independent ADLs, IADLs (light)     Hand Dominance        Extremity/Trunk Assessment   Upper Extremity Assessment Upper Extremity Assessment: Overall WFL for tasks assessed    Lower Extremity Assessment Lower Extremity Assessment: RLE deficits/detail;LLE deficits/detail RLE Deficits / Details: BIlaterally, pt with decreased ROM in ankles, and pt attempting to hold ankles in plantarflexion. When standing, pt on toes, with difficulty getting heels down on floor. R appears mildly more plantarflexed than L.    Cervical / Trunk Assessment Cervical / Trunk Assessment: Normal  Communication   Communication: No difficulties  Cognition Arousal/Alertness: Awake/alert Behavior During Therapy: WFL for tasks assessed/performed Overall Cognitive Status: Impaired/Different from baseline Area of Impairment: Orientation, Memory, Following commands, Safety/judgement                 Orientation Level: Disoriented to, Time (Asking "is it Wednesday?")   Memory: Decreased short-term memory Following Commands: Follows one step commands consistently, Follows multi-step commands inconsistently Safety/Judgement: Decreased awareness of safety, Decreased awareness of deficits              General Comments      Exercises      Assessment/Plan    PT Assessment Patient needs continued PT services  PT Problem List Decreased strength;Decreased range of motion;Decreased activity tolerance;Decreased balance;Decreased mobility;Decreased cognition;Decreased knowledge of use of DME;Decreased safety awareness;Decreased knowledge of precautions;Pain;Decreased skin integrity       PT Treatment Interventions DME instruction;Gait training;Functional mobility training;Therapeutic activities;Therapeutic exercise;Balance training;Cognitive remediation;Patient/family education    PT Goals (Current goals can be found in the Care Plan section)  Acute Rehab PT Goals Patient Stated Goal: return to [facility] PT Goal Formulation: With patient Time For Goal Achievement: 03/26/22 Potential to Achieve Goals: Good    Frequency Min 3X/week     Co-evaluation               AM-PAC PT "6 Clicks" Mobility  Outcome Measure Help needed turning from your back to your side while in a flat bed without using bedrails?: None Help needed moving from lying on your back to sitting on the side of a flat bed without using bedrails?: None Help needed moving to and from a bed to a chair (including a wheelchair)?: A Lot Help needed standing up from a chair using your arms (e.g., wheelchair or bedside chair)?: A Lot Help needed to walk in hospital room?: Total Help needed climbing 3-5 steps with a railing? : Total 6  Click Score: 14    End of Session Equipment Utilized During Treatment: Gait belt Activity Tolerance: Patient limited by pain Patient left: in chair;with call bell/phone within reach;with chair alarm set Nurse Communication: Mobility status PT Visit Diagnosis: Unsteadiness on feet (R26.81);Pain;Difficulty in walking, not elsewhere classified (R26.2) Pain - Right/Left:  (Bilaterally) Pain - part of body: Leg    Time: 1610-9604 PT Time Calculation (min) (ACUTE ONLY): 30 min   Charges:   PT Evaluation $PT Eval Moderate  Complexity: 1 Mod PT Treatments $Gait Training: 8-22 mins        Rolinda Roan, PT, DPT Acute Rehabilitation Services Secure Chat Preferred Office: [phone]   Thelma Comp 03/12/2022, 1:04 PM

## 2022-03-13 DIAGNOSIS — J479 Bronchiectasis, uncomplicated: Secondary | ICD-10-CM | POA: Diagnosis not present

## 2022-03-13 DIAGNOSIS — L0390 Cellulitis, unspecified: Secondary | ICD-10-CM | POA: Diagnosis not present

## 2022-03-13 LAB — CBC
HCT: 29.8 % — ABNORMAL LOW (ref 36.0–46.0)
Hemoglobin: 9.9 g/dL — ABNORMAL LOW (ref 12.0–15.0)
MCH: 29.6 pg (ref 26.0–34.0)
MCHC: 33.2 g/dL (ref 30.0–36.0)
MCV: 89.2 fL (ref 80.0–100.0)
Platelets: 346 10*3/uL (ref 150–400)
RBC: 3.34 MIL/uL — ABNORMAL LOW (ref 3.87–5.11)
RDW: 13.2 % (ref 11.5–15.5)
WBC: 18 10*3/uL — ABNORMAL HIGH (ref 4.0–10.5)
nRBC: 0 % (ref 0.0–0.2)

## 2022-03-13 LAB — MAGNESIUM: Magnesium: 2.1 mg/dL (ref 1.7–2.4)

## 2022-03-13 LAB — BASIC METABOLIC PANEL
Anion gap: 13 (ref 5–15)
BUN: 12 mg/dL (ref 8–23)
CO2: 22 mmol/L (ref 22–32)
Calcium: 8.1 mg/dL — ABNORMAL LOW (ref 8.9–10.3)
Chloride: 99 mmol/L (ref 98–111)
Creatinine, Ser: 0.6 mg/dL (ref 0.44–1.00)
GFR, Estimated: >60 mL/min (ref >60)
Glucose, Bld: 96 mg/dL (ref 70–99)
Potassium: 3.6 mmol/L (ref 3.5–5.1)
Sodium: 134 mmol/L — ABNORMAL LOW (ref 135–145)

## 2022-03-13 MED ORDER — POTASSIUM CHLORIDE CRYS ER 20 MEQ PO TBCR
40.0000 meq | EXTENDED_RELEASE_TABLET | Freq: Once | ORAL | Status: AC
  Administered 2022-03-13: 40 meq via ORAL
  Filled 2022-03-13: qty 2

## 2022-03-13 MED ORDER — MEDIHONEY WOUND/BURN DRESSING EX PSTE
1.0000 | PASTE | Freq: Every day | CUTANEOUS | Status: DC
  Administered 2022-03-13 – 2022-03-17 (×5): 1 via TOPICAL
  Filled 2022-03-13 (×3): qty 44

## 2022-03-13 MED ORDER — SODIUM CHLORIDE 3 % IN NEBU
4.0000 mL | INHALATION_SOLUTION | Freq: Two times a day (BID) | RESPIRATORY_TRACT | Status: DC
  Administered 2022-03-13 – 2022-03-14 (×3): 4 mL via RESPIRATORY_TRACT
  Filled 2022-03-13 (×3): qty 4

## 2022-03-13 NOTE — Progress Notes (Signed)
Pt not tolerating hypertonic tx well.  Continues to cough throughout tx.  Cough is dry and nonproductive.  Pt ordered delsym to prevent cough but Hypertonic is generating cough.  Unclear of reasoning for tx at this ttime.  Suggest d/c  hypertonic tx if pt is on cough suppressant, especially while cough is dry and nonproductive.  RT will reassess in AM for further need of tx.

## 2022-03-13 NOTE — Progress Notes (Signed)
PROGRESS NOTE    ZOFIA PECKINPAUGH  LPF:[id number] DOB: [birth year] DOA: 03/10/2022 PCP: Lavone Orn, MD    Brief Narrative:   KEWANNA KASPRZAK is a 81 y.o. female with medical history significant of breast cancer, bronchiectasis, a fib with recent cardioversion.  She was discharged on 6/28 after cardioversion and developed mental status change per daughter.  She went back to her ALF and became confused and was hallucinating- started gradually but worsened around 8 PM.  Daughter also reports that her legs are more swollen, painful and erythematous.    Being treated for cellulitis.  Continue IV abx.  PT recommends SNF placement.    Assessment and Plan: Confusion/AMS -more consistent with cellulitis -given abx in the ER -continue rocephin 2 grams daily -urine culture <20,000 -improved in AM of 6/30-- back to baseline per family -delirium precautions   Cellulitis: WOC for wound care -IV abx    Persistent atrial fibrillation: TSH 1.655.  - s/p DCCV 6/27 AM.  - Continue metoprolol - Continue digoxin- level low - Continue anticoagulation with dabigatran since blood counts are stable and CHA2DS2-VASc score is 70 (age x2, sex)   - Keep K and Mg repleted    Hyponatremia -? Dehydration resolved   chronic HFpEF, moderate MR: stable   Neutrophilic dermatosis:  -skin biopsy left wrist lesion 6/19 by dermatology, Dr. Rozann Lesches. Ulcerating lesions become painful after gradual enlargement and coalescence. Nonpruritic. ITP, TTP, DIC, anti-phospholipid syndrome unlikely. Platelet count and coagulation studies are normal. Unclear precipitant in this case, though inflammatory markers including RF are elevated and there is +FH of autoimmune disorders. Oddly despite hx +ANA, is negative on last check. However, would be odd to manifest so late in life and has no hx of same, no arthralgias, no typical Sjogren-type symptoms, or ocular involvement. Does not seem to be complement-mediated.  Cryoglobulinemia less like, hepatitis panel negative.  -local wound care - Keep follow up appointment with rheumatology, Dr. Dossie Der, on 7/10. Unfortunately, no consultative services are available at this hospital.  - Removed possible precipitant medications (eliquis, diltiazem). -added pain meds for ambulation   Neutrophilic leukocytosis:  -see above re: infection   Microscopic hematuria:  -CT abd/pelvis performed last admission without mass or stone. Possibility of "mild perinephric stranding perhaps slightly worse on the right than the left" is mentioned in radiology report. - prior MD Dr. Bonner Puna spoke with urology, Dr. Tresa Moore who personally reviewed the images and feels no further work up or treatment should be necessary. Patient is urged to follow up with [facility] (known to the practice s/p bladder tack remotely) for consideration of cystoscopy.    History of MAI s/p 6 months Tx and resultant extensive bronchiectasis: AFB Cx, smear negative July 2022. CXR 6/23 showed RUL scarring without consolidation or pulmonary edema. No pulmonary complaints at this time. - Not currently symptomatic - Continue delsym, prn albuterol.    History of left breast CA s/p XRT, chemotherapy   Advanced age: Pt presented from [place].  -family working to get her into ALF   Glaucoma: Quiescent.  - Continue drops   DVT prophylaxis:  dabigatran (PRADAXA) capsule 150 mg    Code Status: Partial Code Family Communication: daughters  Disposition Plan:  Level of care: Telemetry Cardiac Status is: Inpatient Remains inpatient appropriate because: needs IV abx    Consultants:  none   Subjective: Legs feel better today  Objective: Vitals:   03/13/22 0026 03/13/22 0405 03/13/22 0804 03/13/22 0819  BP: 123/63 119/63  Marland Kitchen)  124/53  Pulse: 80 78  94  Resp: 16     Temp: 99.1 F (37.3 C) 99.5 F (37.5 C)  98.2 F (36.8 C)  TempSrc: Oral Oral  Oral  SpO2: 97% 95% 95% 96%  Weight:       Height:       No intake or output data in the 24 hours ending 03/13/22 1105  Filed Weights   03/10/22 1258  Weight: 48.7 kg    Examination:   General: Appearance:    Thin female in no acute distress     Lungs:     respirations unlabored  Heart:    Normal heart rate.    MS:   All extremities are intact.         Neurologic:   Awake, alert, oriented x 3. No apparent focal neurological           defect.          Data Reviewed: I have personally reviewed following labs and imaging studies  CBC: Recent Labs  Lab 03/09/22 0130 03/10/22 1123 03/11/22 0231 03/12/22 0321 03/13/22 0246  WBC 14.1* 20.6* 19.6* 16.4* 18.0*  NEUTROABS 10.0*  --   --  12.8*  --   HGB 10.3* 11.0* 10.7* 10.1* 9.9*  HCT 30.9* 34.6* 33.7* 31.3* 29.8*  MCV 90.9 91.5 91.6 90.2 89.2  PLT 326 390 350 371 403   Basic Metabolic Panel: Recent Labs  Lab 03/07/22 0127 03/08/22 0051 03/09/22 0130 03/10/22 1123 03/11/22 0231 03/12/22 0321 03/13/22 0246  NA 132* 134* 134* 130* 131* 136 134*  K 3.7 4.0 3.6 5.0 4.2 4.2 3.6  CL 94* 99 101 98 100 99 99  CO2 _0 GLUCOSE 111* 108* 104* 152* 94 93 96  BUN _1 CREATININE 0.77 0.72 0.67 0.74 0.74 0.70 0.60  CALCIUM 8.4* 8.4* 8.1* 8.7* 8.3* 8.6* 8.1*  MG 1.8 1.8  --   --  2.0  --  2.1   GFR: Estimated Creatinine Clearance: 41 mL/min (by C-G formula based on SCr of 0.6 mg/dL). Liver Function Tests: Recent Labs  Lab 03/10/22 1123 03/11/22 0231  AST 25 21  ALT 21 19  ALKPHOS 109 92  BILITOT 1.6* 1.3*  PROT 8.3* 6.7  ALBUMIN 3.1* 2.4*   No results for input(s): "LIPASE", "AMYLASE" in the last 168 hours. No results for input(s): "AMMONIA" in the last 168 hours. Coagulation Profile: No results for input(s): "INR", "PROTIME" in the last 168 hours. Cardiac Enzymes: No results for input(s): "CKTOTAL", "CKMB", "CKMBINDEX", "TROPONINI" in the last 168 hours. BNP (last 3 results) No results for input(s): "PROBNP"  in the last 8760 hours. HbA1C: No results for input(s): "HGBA1C" in the last 72 hours. CBG: Recent Labs  Lab 03/10/22 1125  GLUCAP 155*   Lipid Profile: No results for input(s): "CHOL", "HDL", "LDLCALC", "TRIG", "CHOLHDL", "LDLDIRECT" in the last 72 hours. Thyroid Function Tests: No results for input(s): "TSH", "T4TOTAL", "FREET4", "T3FREE", "THYROIDAB" in the last 72 hours. Anemia Panel: No results for input(s): "VITAMINB12", "FOLATE", "FERRITIN", "TIBC", "IRON", "RETICCTPCT" in the last 72 hours. Sepsis Labs: No results for input(s): "PROCALCITON", "LATICACIDVEN" in the last 168 hours.  Recent Results (from the past 240 hour(s))  Urine Culture     Status: Abnormal   Collection Time: 03/10/22  3:44 PM   Specimen: In/Out Cath Urine  Result Value Ref Range Status   Specimen Description IN/OUT CATH URINE  Final  Special Requests   Final    NONE Performed at [facility], [place] [address], [place], [place] [address]    Culture 20,000 COLONIES/mL ENTEROCOCCUS FAECALIS (A)  Final   Report Status 03/12/2022 FINAL  Final   Organism ID, Bacteria ENTEROCOCCUS FAECALIS (A)  Final      Susceptibility   Enterococcus faecalis - MIC*    AMPICILLIN <=2 SENSITIVE Sensitive     NITROFURANTOIN <=16 SENSITIVE Sensitive     VANCOMYCIN 1 SENSITIVE Sensitive     * 20,000 COLONIES/mL ENTEROCOCCUS FAECALIS         Radiology Studies: No results found.      Scheduled Meds:  brinzolamide  1 drop Right Eye BID   cholecalciferol  2,000 Units Oral Once per day on Mon Wed Fri   dabigatran  150 mg Oral Q12H   digoxin  0.0625 mg Oral Daily   latanoprost  1 drop Right Eye QHS   leptospermum manuka honey  1 Application Topical Daily   metoprolol succinate  50 mg Oral Daily   sodium chloride HYPERTONIC  4 mL Nebulization BID   Continuous Infusions:  cefTRIAXone (ROCEPHIN)  IV 2 g (03/13/22 1051)     LOS: 2 days    Time spent: 35 minutes spent on chart review, discussion with  nursing staff, consultants, updating family and interview/physical exam; more than 50% of that time was spent in counseling and/or coordination of care.    Geradine Girt, DO [facility] Available via Epic secure chat 7am-7pm After these hours, please refer to coverage provider listed on [email or web address] 03/13/2022, 11:05 AM

## 2022-03-14 DIAGNOSIS — N3001 Acute cystitis with hematuria: Secondary | ICD-10-CM | POA: Diagnosis not present

## 2022-03-14 DIAGNOSIS — J479 Bronchiectasis, uncomplicated: Secondary | ICD-10-CM | POA: Diagnosis not present

## 2022-03-14 DIAGNOSIS — L0390 Cellulitis, unspecified: Secondary | ICD-10-CM | POA: Diagnosis not present

## 2022-03-14 LAB — BASIC METABOLIC PANEL
Anion gap: 8 (ref 5–15)
BUN: 11 mg/dL (ref 8–23)
CO2: 23 mmol/L (ref 22–32)
Calcium: 8.1 mg/dL — ABNORMAL LOW (ref 8.9–10.3)
Chloride: 103 mmol/L (ref 98–111)
Creatinine, Ser: 0.57 mg/dL (ref 0.44–1.00)
GFR, Estimated: >60 mL/min (ref >60)
Glucose, Bld: 81 mg/dL (ref 70–99)
Potassium: 3.9 mmol/L (ref 3.5–5.1)
Sodium: 134 mmol/L — ABNORMAL LOW (ref 135–145)

## 2022-03-14 MED ORDER — PANTOPRAZOLE SODIUM 40 MG PO TBEC
40.0000 mg | DELAYED_RELEASE_TABLET | Freq: Every day | ORAL | Status: DC
  Administered 2022-03-14 – 2022-03-17 (×4): 40 mg via ORAL
  Filled 2022-03-14 (×4): qty 1

## 2022-03-14 MED ORDER — MAGIC MOUTHWASH W/LIDOCAINE
5.0000 mL | Freq: Three times a day (TID) | ORAL | Status: DC | PRN
  Administered 2022-03-14 – 2022-03-17 (×6): 5 mL via ORAL
  Filled 2022-03-14 (×9): qty 5

## 2022-03-14 MED ORDER — PREDNISONE 50 MG PO TABS
50.0000 mg | ORAL_TABLET | Freq: Every day | ORAL | Status: DC
  Administered 2022-03-14: 50 mg via ORAL
  Filled 2022-03-14: qty 1

## 2022-03-14 MED ORDER — PREDNISONE 20 MG PO TABS
40.0000 mg | ORAL_TABLET | Freq: Every day | ORAL | Status: DC
  Administered 2022-03-15 – 2022-03-16 (×2): 40 mg via ORAL
  Filled 2022-03-14 (×2): qty 2

## 2022-03-14 NOTE — Progress Notes (Signed)
PT Cancellation Note  Patient Details Name: [name] Rice MRN: [id number] DOB: [birth year]   Cancelled Treatment:    Reason Eval/Treat Not Completed: Patient declined, no reason specified: pt declined PT post-dressing change on LE wounds, stating dressing change was painful and just got comfortable. Pt appears fatigued. Check back on pt ~45 min later and pt was sleeping. Will check back on next available date to continue with PT POC.    Havery Moros 03/14/2022, 3:15 PM

## 2022-03-14 NOTE — NC FL2 (Signed)
[place] LEVEL OF CARE SCREENING TOOL     IDENTIFICATION  Patient Name: [name] Rice Birthdate: May 12, 1941 Sex: female Admission Date (Current Location): 03/10/2022  [facility] and [place] Number:  Herbalist and Address:  [facility] [place], [place] [address] [place], [place] [address]      Provider Number: [id number]  Attending Physician Name and Address:  Geradine Girt, DO  Relative Name and Phone Number:  Barbaraann Share [phone]    Current Level of Care: Hospital Recommended Level of Care: [place] Prior Approval Number:    Date Approved/Denied:   PASRR Number: [id number]  Discharge Plan: SNF    Current Diagnoses: Patient Active Problem List   Diagnosis Date Noted   UTI (urinary tract infection) 03/10/2022   Cellulitis 03/10/2022   Confusion 03/10/2022   Hyponatremia 03/10/2022   Palpable purpura ([place]) 03/05/2022   Atrial fibrillation with RVR ([place]) 03/04/2022   Persistent atrial fibrillation ([place])    Protein-calorie malnutrition, moderate ([place]) 11/10/2020   CAP (community acquired pneumonia) 06/27/2020   GERD (gastroesophageal reflux disease) 03/26/2019   Medication monitoring encounter 10/16/2018   Malignant neoplasm of lower-inner quadrant of left breast in female, estrogen receptor negative ([place]) 01/22/2018   Pulmonary Mycobacterium avium complex (MAC) infection ([place]) 11/28/2017   Bronchiectasis without complication ([place]) 93/26/7124   Hemoptysis 08/31/2017   CHRONIC ANGLE-CLOSURE GLAUCOMA 08/30/2007   ALLERGIC RHINITIS DUE TO POLLEN 08/30/2007   TMJ SYNDROME 11/09/2006   SCIATICA 11/09/2006    Orientation RESPIRATION BLADDER Height & Weight     Self, Time, Situation, Place    Continent Weight: 107 lb 4.8 oz (48.7 kg) Height:  _0  (157.5 cm)  BEHAVIORAL SYMPTOMS/MOOD NEUROLOGICAL BOWEL NUTRITION STATUS      Continent    AMBULATORY STATUS COMMUNICATION OF NEEDS Skin   Extensive Assist Verbally  Skin abrasions (Bilateral Tibial Blisters)                       Personal Care Assistance Level of Assistance  Bathing, Feeding, Dressing Bathing Assistance: Maximum assistance Feeding assistance: Limited assistance Dressing Assistance: Maximum assistance     Functional Limitations Info  Sight, Hearing, Speech Sight Info: Adequate Hearing Info: Adequate Speech Info: Adequate    SPECIAL CARE FACTORS FREQUENCY  PT (By licensed PT), OT (By licensed OT)     PT Frequency: 5x week OT Frequency: 5x week            Contractures Contractures Info: Not present    Additional Factors Info  Code Status, Allergies Code Status Info: Partial Allergies Info: Apraclonidine, Iodine           Current Medications (03/14/2022):  This is the current hospital active medication list Current Facility-Administered Medications  Medication Dose Route Frequency Provider Last Rate Last Admin   acetaminophen (TYLENOL) tablet 650 mg  650 mg Oral Q6H PRN Geradine Girt, DO   650 mg at 03/10/22 1901   Or   acetaminophen (TYLENOL) suppository 650 mg  650 mg Rectal Q6H PRN Eulogio Bear U, DO       albuterol (PROVENTIL) (2.5 MG/3ML) 0.083% nebulizer solution   Inhalation Q6H PRN Eliseo Squires, Lianny Molter U, DO       brinzolamide (AZOPT) 1 % ophthalmic suspension 1 drop  1 drop Right Eye BID Vann, Shye Doty U, DO   1 drop at 03/14/22 0942   cefTRIAXone (ROCEPHIN) 2 g in sodium chloride 0.9 % 100 mL IVPB  2 g Intravenous  Q24H Eulogio Bear U, DO 200 mL/hr at 03/14/22 1053 2 g at 03/14/22 1053   cholecalciferol (VITAMIN D3) tablet 2,000 Units  2,000 Units Oral Once per day on Mon Wed Fri Vann, Elijio Staples U, DO   2,000 Units at 03/14/22 5993   dabigatran (PRADAXA) capsule 150 mg  150 mg Oral Q12H Eulogio Bear U, DO   150 mg at 03/14/22 5701   digoxin (LANOXIN) tablet 0.0625 mg  0.0625 mg Oral Daily Eulogio Bear U, DO   0.0625 mg at 03/14/22 7793   HYDROcodone-acetaminophen (NORCO/VICODIN) 5-325 MG per tablet 1 tablet   1 tablet Oral Q6H PRN Eulogio Bear U, DO       latanoprost (XALATAN) 0.005 % ophthalmic solution 1 drop  1 drop Right Eye QHS Eulogio Bear U, DO   1 drop at 03/13/22 2131   leptospermum manuka honey (MEDIHONEY) paste 1 Application  1 Application Topical Daily Geradine Girt, DO   1 Application at 90/30/09 1359   magic mouthwash w/lidocaine  5 mL Oral TID PRN Eulogio Bear U, DO       metoprolol succinate (TOPROL-XL) 24 hr tablet 50 mg  50 mg Oral Daily Vann, Meara Wiechman U, DO   50 mg at 03/14/22 0939   ondansetron (ZOFRAN) tablet 4 mg  4 mg Oral Q6H PRN Eulogio Bear U, DO       Or   ondansetron (ZOFRAN) injection 4 mg  4 mg Intravenous Q6H PRN Vann, Zitlali Primm U, DO       pantoprazole (PROTONIX) EC tablet 40 mg  40 mg Oral Daily Vann, Winifred Bodiford U, DO       predniSONE (DELTASONE) tablet 50 mg  50 mg Oral Q breakfast Eulogio Bear U, DO         Discharge Medications: Please see discharge summary for a list of discharge medications.  Relevant Imaging Results:  Relevant Lab Results:   Additional Information SS# [id number], [place]

## 2022-03-14 NOTE — TOC Initial Note (Addendum)
Transition of Care [facility]) - Initial/Assessment Note    Patient Details  Name: [name] Rice MRN: [id number] Date of Birth: 1941-08-26  Transition of [facility]) CM/SW Contact:    Coralee Pesa, [place] Phone Number: 03/14/2022, 4:31 PM  Clinical Narrative:                 CSW spoke with [facility] and confirmed pt is an [place] resident. CSW advised them of SNF rec and Facility states [place] has no SNF beds, but [place] does. FHW states that a bed at [facility] would be an out of pocket expense until a bed opened up at [place] spoke with pt at bedside, she is agreeable to SNF, but unsure about [facility]. She states her husband is at [facility] going in to SNF, and she wants to be near him. She states her daughter Barbaraann Share is taking care of decisions and requests CSW call her. CSW spoke with Barbaraann Share, she is agreeable to SNF, but does not want to pay out of pocket to go to [place]. She stated she will speak with facility to come up with a plan. CSW will send referral to both facilities. TOC will reach back out to daughter tomorrow to follow up on disposition.  Expected Discharge Plan: Skilled Nursing Facility Barriers to Discharge: Continued Medical Work up, SNF Pending bed offer, Insurance Authorization   Patient Goals and CMS Choice Patient states their goals for this hospitalization and ongoing recovery are:: Pt would like to recieve care near her husband. CMS Medicare.gov Compare Post Acute Care list provided to:: Patient Choice offered to / list presented to : Patient  Expected Discharge Plan and Services Expected Discharge Plan: [place] Choice: [place] Living arrangements for the past 2 months: [place]                                      Prior Living Arrangements/Services Living arrangements for the past 2 months: [place] Lives with:: Self, Spouse Patient language and need for  interpreter reviewed:: Yes Do you feel safe going back to the place where you live?: Yes      Need for Family Participation in Patient Care: Yes (Comment) Care giver support system in place?: Yes (comment)   Criminal Activity/Legal Involvement Pertinent to Current Situation/Hospitalization: No - Comment as needed  Activities of Daily Living Home Assistive Devices/Equipment: Shower chair without back, Walker (specify type), Eyeglasses, Grab bars around toilet, Grab bars in shower, Hand-held shower hose, Raised toilet seat with rails, Scales (chest compression unit for chest PT) ADL Screening (condition at time of admission) Patient's cognitive ability adequate to safely complete daily activities?: Yes Is the patient deaf or have difficulty hearing?: No Does the patient have difficulty seeing, even when wearing glasses/contacts?: No Does the patient have difficulty concentrating, remembering, or making decisions?: No Patient able to express need for assistance with ADLs?: No Does the patient have difficulty dressing or bathing?: No Independently performs ADLs?: Yes (appropriate for developmental age) Does the patient have difficulty walking or climbing stairs?: No Weakness of Legs: Both Weakness of Arms/Hands: None  Permission Sought/Granted Permission sought to share information with : Family Supports Permission granted to share information with : Yes, Verbal Permission Granted  Share Information with NAME: Barbaraann Share     Permission granted to share info w Relationship: Daughter  Permission granted to share info w Contact Information: [phone]  Emotional Assessment Appearance:: Appears stated age Attitude/Demeanor/Rapport: Engaged Affect (typically observed): Pleasant Orientation: : Oriented to Self, Oriented to Place, Oriented to  Time, Oriented to Situation Alcohol / Substance Use: Not Applicable Psych Involvement: No (comment)  Admission diagnosis:  UTI (urinary tract  infection) [N39.0] Encephalopathy [G93.40] Urinary tract infection without hematuria, site unspecified [N39.0] Cellulitis, unspecified cellulitis site [L03.90] Cellulitis [L03.90] Patient Active Problem List   Diagnosis Date Noted   UTI (urinary tract infection) 03/10/2022   Cellulitis 03/10/2022   Confusion 03/10/2022   Hyponatremia 03/10/2022   Palpable purpura ([place]) 03/05/2022   Atrial fibrillation with RVR ([place]) 03/04/2022   Persistent atrial fibrillation ([place])    Protein-calorie malnutrition, moderate ([place]) 11/10/2020   CAP (community acquired pneumonia) 06/27/2020   GERD (gastroesophageal reflux disease) 03/26/2019   Medication monitoring encounter 10/16/2018   Malignant neoplasm of lower-inner quadrant of left breast in female, estrogen receptor negative ([place]) 01/22/2018   Pulmonary Mycobacterium avium complex (MAC) infection ([place]) 11/28/2017   Bronchiectasis without complication ([place]) 16/58/0063   Hemoptysis 08/31/2017   CHRONIC ANGLE-CLOSURE GLAUCOMA 08/30/2007   ALLERGIC RHINITIS DUE TO POLLEN 08/30/2007   TMJ SYNDROME 11/09/2006   SCIATICA 11/09/2006   PCP:  Lavone Orn, MD Pharmacy:   [id number] [address]Phone: [phone]: [address]Phone: [phone]: [phone]    Social Determinants of Health (SDOH) Interventions    Readmission Risk Interventions     No data to display

## 2022-03-14 NOTE — Consult Note (Addendum)
[place] Nurse Consult Note: Reason for Consult:Patient with wounds unclear etiology to bilateral lower legs.  Was seen by [facility] team on 03/06/22 with topical recommendations for Xeroform put in place  New orders for Manuka honey topical orders added 03/13/22 by Dr. Eliseo Squires.  Will add wound care orders for bedside RN guidance.   Awaiting biopsy of wound, performed by Dermatology.  Has Rheumatology consult on 03/21/22 and orders for antibiotic therapy in place for cellulitis to lower legs in place at this time.  Patient states her legs feel a little better today.  Wound type: unclear- full thickness scabbed lesions to bilateral lower legs. Biopsy obtained. Consult with rheumatology pending.  Pressure Injury POA: NA Measurement: scattered scabbed lesions to lower legs. Photo in chart.   Wound IZX:YOFVWAQ, dark Drainage (amount, consistency, odor) minimal serosanguinous  no odor.  Periwound: edema, improving Dressing procedure/placement/frequency: Cleanse wounds to lower legs with NS. Pat dry then apply a nickel thick layer of MediHoney directly to the wound or onto a dressing. Make certain that the dressing covers the entire wound base, but not in contact with the peri-wound skin.  Next, cover with gauze and secure with kerlix/tape.  Change dressing daily.     Will not follow at this time.  Please re-consult if needed.  Domenic Moras MSN, RN, FNP-BC CWON Wound, Ostomy, Continence Nurse Pager [phone]

## 2022-03-14 NOTE — Progress Notes (Signed)
PROGRESS NOTE    [name] Rice  NID:[id number] DOB: [birth year] DOA: 03/10/2022 PCP: Lavone Orn, MD    Brief Narrative:   [name] Rice is a 81 y.o. female with medical history significant of breast cancer, bronchiectasis, a fib with recent cardioversion.  She was discharged on 6/28 after cardioversion and developed mental status change per daughter.  She went back to her ALF and became confused and was hallucinating- started gradually but worsened around 8 PM.  Daughter also reports that her legs are more swollen, painful and erythematous.    Being treated for cellulitis.  Continue IV abx.  PT recommends SNF placement.    Assessment and Plan: Confusion/AMS -resolved -due to cellulitis -continue rocephin 2 grams daily -urine culture <20,000 -improved in AM of 6/30-- back to baseline per family -delirium precautions   Cellulitis: WOC for wound care -IV abx    Persistent atrial fibrillation: TSH 1.655.  - s/p DCCV 6/27 AM.  - Continue metoprolol - Continue digoxin- level low - Continue anticoagulation with dabigatran since blood counts are stable and CHA2DS2-VASc score is 67 (age x2, sex)   - Keep K and Mg repleted    Hyponatremia -? Dehydration -stable   chronic HFpEF, moderate MR: stable   Neutrophilic dermatosis:  -skin biopsy left wrist lesion 6/19 by dermatology, Dr. Rozann Lesches. Ulcerating lesions become painful after gradual enlargement and coalescence. Nonpruritic. ITP, TTP, DIC, anti-phospholipid syndrome unlikely. Platelet count and coagulation studies are normal. Unclear precipitant in this case, though inflammatory markers including RF are elevated and there is +FH of autoimmune disorders. Oddly despite hx +ANA, is negative on last check. However, would be odd to manifest so late in life and has no hx of same, no arthralgias, no typical Sjogren-type symptoms, or ocular involvement. Does not seem to be complement-mediated. Cryoglobulinemia less like, hepatitis  panel negative.  -local wound care - Keep follow up appointment with rheumatology, Dr. Dossie Der, on 7/10. Unfortunately, no consultative services are available at this hospital.  - Removed possible precipitant medications (eliquis, diltiazem). 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 Possibility of "mild perinephric stranding perhaps slightly worse on the right than the left" is mentioned in radiology report. - prior MD Dr. Bonner Puna spoke with urology, Dr. Tresa Moore who personally reviewed the images and feels no further work up or treatment should be necessary. Patient is urged to follow up with [facility] (known to the practice s/p bladder tack remotely) for consideration of cystoscopy.    History of MAI s/p 6 months Tx and resultant extensive bronchiectasis: AFB Cx, smear negative July 2022. CXR 6/23 showed RUL scarring without consolidation or pulmonary edema. No pulmonary complaints at this time. - Not currently symptomatic - Continue delsym, prn albuterol.    History of left breast CA s/p XRT, chemotherapy   Advanced age: Pt presented from [place].  -family working to get her into ALF   Glaucoma: Quiescent.  - Continue drops   Will ask CIR to review chart to see if candidate   DVT prophylaxis:  dabigatran (PRADAXA) capsule 150 mg    Code Status: Partial Code Family Communication: daughter on phone  Disposition Plan:  Level of care: Telemetry Cardiac Status is: Inpatient Remains inpatient appropriate because: needs IV abx    Consultants:  none   Subjective: Legs hurt more  today  Objective: Vitals:   03/13/22 1958 03/14/22 0425 03/14/22 0756 03/14/22 0825  BP:  131/76  (!) 129/59  Pulse:  76  84  Resp:  16  16  Temp:  97.6 F (36.4 C)  97.9 F (36.6 C)   TempSrc:  Oral  Oral  SpO2: 95% 99% 96% 100%  Weight:      Height:        Intake/Output Summary (Last 24 hours) at 03/14/2022 1138 Last data filed at 03/14/2022 1053 Gross per 24 hour  Intake 300 ml  Output --  Net 300 ml    Filed Weights   03/10/22 1258  Weight: 48.7 kg    Examination:   General: Appearance:    Thin female in no acute distress     Lungs:     respirations unlabored  Heart:    Normal heart rate.    MS:   All extremities are intact.         Neurologic:   Awake, alert, oriented x 3. No apparent focal neurological           defect.          Data Reviewed: I have personally reviewed following labs and imaging studies  CBC: Recent Labs  Lab 03/09/22 0130 03/10/22 1123 03/11/22 0231 03/12/22 0321 03/13/22 0246  WBC 14.1* 20.6* 19.6* 16.4* 18.0*  NEUTROABS 10.0*  --   --  12.8*  --   HGB 10.3* 11.0* 10.7* 10.1* 9.9*  HCT 30.9* 34.6* 33.7* 31.3* 29.8*  MCV 90.9 91.5 91.6 90.2 89.2  PLT 326 390 350 371 331   Basic Metabolic Panel: Recent Labs  Lab 03/08/22 0051 03/09/22 0130 03/10/22 1123 03/11/22 0231 03/12/22 0321 03/13/22 0246 03/14/22 0335  NA 134*   < > 130* 131* 136 134* 134*  K 4.0   < > 5.0 4.2 4.2 3.6 3.9  CL 99   < > 98 100 99 99 103  CO2 24   < > _0 GLUCOSE 108*   < > 152* 94 93 96 81  BUN 15   < > _1 CREATININE 0.72   < > 0.74 0.74 0.70 0.60 0.57  CALCIUM 8.4*   < > 8.7* 8.3* 8.6* 8.1* 8.1*  MG 1.8  --   --  2.0  --  2.1  --    < > = values in this interval not displayed.   GFR: Estimated Creatinine Clearance: 41 mL/min (by C-G formula based on SCr of 0.57 mg/dL). Liver Function Tests: Recent Labs  Lab 03/10/22 1123 03/11/22 0231  AST 25 21  ALT 21 19  ALKPHOS 109 92  BILITOT 1.6* 1.3*  PROT 8.3* 6.7  ALBUMIN 3.1* 2.4*   No results for input(s): "LIPASE", "AMYLASE" in the last 168 hours. No results for input(s): "AMMONIA" in the last 168 hours. Coagulation Profile: No results for  input(s): "INR", "PROTIME" in the last 168 hours. Cardiac Enzymes: No results for input(s): "CKTOTAL", "CKMB", "CKMBINDEX", "TROPONINI" in the last 168 hours. BNP (last 3 results) No results for input(s): "PROBNP" in the last 8760 hours. HbA1C: No results for input(s): "HGBA1C" in the last 72 hours. CBG: Recent Labs  Lab 03/10/22 1125  GLUCAP 155*   Lipid Profile: No results for input(s): "CHOL", "HDL", "LDLCALC", "TRIG", "CHOLHDL", "LDLDIRECT" in the last 72 hours. Thyroid Function Tests: No results for input(s): "TSH", "T4TOTAL", "FREET4", "T3FREE", "THYROIDAB" in the last 72 hours. Anemia Panel:  No results for input(s): "VITAMINB12", "FOLATE", "FERRITIN", "TIBC", "IRON", "RETICCTPCT" in the last 72 hours. Sepsis Labs: No results for input(s): "PROCALCITON", "LATICACIDVEN" in the last 168 hours.  Recent Results (from the past 240 hour(s))  Urine Culture     Status: Abnormal   Collection Time: 03/10/22  3:44 PM   Specimen: In/Out Cath Urine  Result Value Ref Range Status   Specimen Description IN/OUT CATH URINE  Final   Special Requests   Final    NONE Performed at [facility], [address], [place], [place] [address]    Culture 20,000 COLONIES/mL ENTEROCOCCUS FAECALIS (A)  Final   Report Status 03/12/2022 FINAL  Final   Organism ID, Bacteria ENTEROCOCCUS FAECALIS (A)  Final      Susceptibility   Enterococcus faecalis - MIC*    AMPICILLIN <=2 SENSITIVE Sensitive     NITROFURANTOIN <=16 SENSITIVE Sensitive     VANCOMYCIN 1 SENSITIVE Sensitive     * 20,000 COLONIES/mL ENTEROCOCCUS FAECALIS         Radiology Studies: No results found.      Scheduled Meds:  brinzolamide  1 drop Right Eye BID   cholecalciferol  2,000 Units Oral Once per day on Mon Wed Fri   dabigatran  150 mg Oral Q12H   digoxin  0.0625 mg Oral Daily   latanoprost  1 drop Right Eye QHS   leptospermum manuka honey  1 Application Topical Daily   metoprolol succinate  50 mg Oral Daily    Continuous Infusions:  cefTRIAXone (ROCEPHIN)  IV 2 g (03/14/22 1053)     LOS: 3 days    Time spent: 35 minutes spent on chart review, discussion with nursing staff, consultants, updating family and interview/physical exam; more than 50% of that time was spent in counseling and/or coordination of care.    Geradine Girt, DO [facility] Available via Epic secure chat 7am-7pm After these hours, please refer to coverage provider listed on [email or web address] 03/14/2022, 11:38 AM

## 2022-03-15 DIAGNOSIS — L0390 Cellulitis, unspecified: Secondary | ICD-10-CM | POA: Diagnosis not present

## 2022-03-15 DIAGNOSIS — E871 Hypo-osmolality and hyponatremia: Secondary | ICD-10-CM | POA: Diagnosis not present

## 2022-03-15 DIAGNOSIS — J479 Bronchiectasis, uncomplicated: Secondary | ICD-10-CM | POA: Diagnosis not present

## 2022-03-15 LAB — BASIC METABOLIC PANEL
Anion gap: 9 (ref 5–15)
BUN: 12 mg/dL (ref 8–23)
CO2: 23 mmol/L (ref 22–32)
Calcium: 8.2 mg/dL — ABNORMAL LOW (ref 8.9–10.3)
Chloride: 100 mmol/L (ref 98–111)
Creatinine, Ser: 0.6 mg/dL (ref 0.44–1.00)
GFR, Estimated: >60 mL/min (ref >60)
Glucose, Bld: 167 mg/dL — ABNORMAL HIGH (ref 70–99)
Potassium: 3.9 mmol/L (ref 3.5–5.1)
Sodium: 132 mmol/L — ABNORMAL LOW (ref 135–145)

## 2022-03-15 LAB — CBC
HCT: 31.7 % — ABNORMAL LOW (ref 36.0–46.0)
Hemoglobin: 10.5 g/dL — ABNORMAL LOW (ref 12.0–15.0)
MCH: 29.8 pg (ref 26.0–34.0)
MCHC: 33.1 g/dL (ref 30.0–36.0)
MCV: 90.1 fL (ref 80.0–100.0)
Platelets: 372 10*3/uL (ref 150–400)
RBC: 3.52 MIL/uL — ABNORMAL LOW (ref 3.87–5.11)
RDW: 13.2 % (ref 11.5–15.5)
WBC: 11.2 10*3/uL — ABNORMAL HIGH (ref 4.0–10.5)
nRBC: 0 % (ref 0.0–0.2)

## 2022-03-15 MED ORDER — SODIUM CHLORIDE 0.9 % IV SOLN
2.0000 g | INTRAVENOUS | Status: DC
  Administered 2022-03-15 – 2022-03-16 (×2): 2 g via INTRAVENOUS
  Filled 2022-03-15: qty 20

## 2022-03-15 NOTE — TOC Progression Note (Addendum)
Transition of Care [facility]) - Progression Note    Patient Details  Name: [name] Rice MRN: [id number] Date of Birth: 08/05/1941  Transition of Care [facility]) CM/SW Contact  [name] Chars, LCSW Phone Number: 03/15/2022, 11:21 AM  Clinical Narrative:   CSW LM with daughter [name] Rice regarding plan at [facility].   1300: CSW spoke with [name] Rice.  She has been in touch with [facility]-she was told that [facility] could accept Medicare and [facility] would be out of pocket.  CSW also noted that CIR is now reviewing pt and [name] Rice said that CIR is her first choice if that does turn out to be an option.   TOC will continue to follow.  Expected Discharge Plan: [place] Barriers to Discharge: Continued Medical Work up, SNF Pending bed offer, Ship broker  Expected Discharge Plan and Services Expected Discharge Plan: [place] Choice: [place] arrangements for the past 2 months: [place]                                       Social Determinants of Health (SDOH) Interventions    Readmission Risk Interventions     No data to display

## 2022-03-15 NOTE — Plan of Care (Signed)
  Problem: Nutrition: Goal: Adequate nutrition will be maintained Outcome: Progressing   Problem: Coping: Goal: Level of anxiety will decrease Outcome: Progressing   Problem: Elimination: Goal: Will not experience complications related to bowel motility Outcome: Progressing

## 2022-03-15 NOTE — Progress Notes (Signed)
Physical Therapy Treatment Patient Details Name: [name] Rice MRN: [id number] DOB: 07-30-1941 Today's Date: 03/15/2022   History of Present Illness Pt is an 82 y/o female who presents back to the ED 03/10/22 after cardioversion where she was discharged on 6/28 back to her ALF, and developed AMS and LE swelling/pain. Concern for cellulitis, and UA suggestive of UTI. PMH significant for bronchiectasis, breast CA in remission, glaucoma.    PT Comments    Pt is progressing well towards goals of PT. Overall, orientation, attention, and command following have improved this session. Today's session focused on functional mobility progression. Pt tolerated 75 ft of ambulation with HR ranging from 73-90 bpm. Pt was mod I for bed mobility, min A for transfers, and min G for ambulation with a RW. Pain in BLE at dressing site continues to be a limiting factor in progression. Pt would benefit from skilled PT in order to increase strength and tolerance to activity in order to maximize independence for a safe d/c. D/c recommendations may be updated depending on progression during stay, pt expressed desire to go to friend's house. Will continue to follow acutely.  Recommendations for follow up therapy are one component of a multi-disciplinary discharge planning process, led by the attending physician.  Recommendations may be updated based on patient status, additional functional criteria and insurance authorization.  Follow Up Recommendations  Skilled nursing-short term rehab (<3 hours/day) Can patient physically be transported by private vehicle: Yes   Assistance Recommended at Discharge Frequent or constant Supervision/Assistance  Patient can return home with the following A little help with walking and/or transfers;A little help with bathing/dressing/bathroom;Assistance with cooking/housework;Assist for transportation   Equipment Recommendations  Rolling walker (2 wheels);BSC/3in1    Recommendations for  Other Services       Precautions / Restrictions Precautions Precautions: Fall Restrictions Weight Bearing Restrictions: No     Mobility  Bed Mobility Overal bed mobility: Modified Independent             General bed mobility comments: HOB elevated and increased time required. No assist required    Transfers Overall transfer level: Needs assistance Equipment used: Rolling walker (2 wheels) Transfers: Sit to/from Stand Sit to Stand: Min assist           General transfer comment: from EOB x1. min A to stand with RW and pt stating "I will push up from the bed with this hand." cues for upright posture. pt with c/o worrying her knees would buckle on standing, did not note any instances during transfer.    Ambulation/Gait Ambulation/Gait assistance: Min guard Gait Distance (Feet): 75 Feet Assistive device: Rolling walker (2 wheels) Gait Pattern/deviations: Step-through pattern, Decreased stride length Gait velocity: decreased Gait velocity interpretation: <1.31 ft/sec, indicative of household ambulator   General Gait Details: pt with slow yet steady gait pattern with decreased foot clearance bilaterally. HR ranging 73-90 bpm.   Stairs             Wheelchair Mobility    Modified Rankin (Stroke Patients Only)       Balance Overall balance assessment: Needs assistance Sitting-balance support: Feet supported, No upper extremity supported Sitting balance-Leahy Scale: Good Sitting balance - Comments: supervision for safety   Standing balance support: Bilateral upper extremity supported, During functional activity Standing balance-Leahy Scale: Fair Standing balance comment: reliant on BUE support for balance  Cognition Arousal/Alertness: Awake/alert Behavior During Therapy: WFL for tasks assessed/performed Overall Cognitive Status: Within Functional Limits for tasks assessed                          Following Commands: Follows one step commands with increased time, Follows multi-step commands with increased time       General Comments: pt pleasant and agreeable to mobility. following simple and complex commands with increased time, proper safety awareness in hall maneuvering RW. stated "I need to order lunch," at the proper time.        Exercises      General Comments        Pertinent Vitals/Pain Pain Assessment Pain Assessment: Faces Faces Pain Scale: Hurts a little bit Pain Location: BLE at dressing site Pain Descriptors / Indicators: Sore, Discomfort Pain Intervention(s): Limited activity within patient's tolerance, Monitored during session, Premedicated before session, Repositioned    Home Living                          Prior Function            PT Goals (current goals can now be found in the care plan section) Acute Rehab PT Goals Patient Stated Goal: [facility] PT Goal Formulation: With patient Time For Goal Achievement: 03/26/22 Potential to Achieve Goals: Good Progress towards PT goals: Progressing toward goals    Frequency    Min 3X/week      PT Plan Current plan remains appropriate    Co-evaluation              AM-PAC PT "6 Clicks" Mobility   Outcome Measure  Help needed turning from your back to your side while in a flat bed without using bedrails?: None Help needed moving from lying on your back to sitting on the side of a flat bed without using bedrails?: None Help needed moving to and from a bed to a chair (including a wheelchair)?: A Little Help needed standing up from a chair using your arms (e.g., wheelchair or bedside chair)?: A Little Help needed to walk in hospital room?: A Little Help needed climbing 3-5 steps with a railing? : A Little 6 Click Score: 20    End of Session Equipment Utilized During Treatment: Gait belt Activity Tolerance: Patient tolerated treatment well;Patient limited by  pain Patient left: in chair;with call bell/phone within reach;with chair alarm set Nurse Communication: Mobility status PT Visit Diagnosis: Unsteadiness on feet (R26.81);Pain;Difficulty in walking, not elsewhere classified (R26.2) Pain - part of body: Leg     Time: 1100-1116 PT Time Calculation (min) (ACUTE ONLY): 16 min  Charges:  $Therapeutic Activity: 8-22 mins                     Havery Moros, MS, [place] Acute Rehabilitation Services Office: [place] 03/15/2022, 12:07 PM

## 2022-03-15 NOTE — Progress Notes (Addendum)
PROGRESS NOTE    [name] Rice  QHU:[id number] DOB: 31-May-1941 DOA: 03/10/2022 PCP: Lavone Orn, MD    Brief Narrative:   [name] Rice is a 81 y.o. female with medical history significant of breast cancer, bronchiectasis, a fib with recent cardioversion.  She was discharged on 6/28 after cardioversion and developed mental status change per daughter.  She went back to her ALF and became confused and was hallucinating- started gradually but worsened around 8 PM.  Daughter also reports that her legs are more swollen, painful and erythematous.    Being treated for cellulitis.  Continue IV abx.  PT recommends SNF placement. -prednisone started on 7/3    Assessment and Plan: Confusion/AMS -resolved -due to cellulitis dount UTI as culture < 20000 -continue rocephin 2 grams daily -improved in AM of 6/30-- back to baseline per family -delirium precautions   Cellulitis: WOC for wound care- will need continues wound care upon d/c -IV abx    Persistent atrial fibrillation: TSH 1.655.  - s/p DCCV 6/27 AM.  - Continue metoprolol - Continue digoxin- level low - Continue anticoagulation with dabigatran since blood counts are stable and CHA2DS2-VASc score is 52 (age x2, sex)   - Keep K and Mg repleted    Hyponatremia -? Dehydration -stable   chronic HFpEF, moderate MR: stable   Neutrophilic dermatosis:  -per prior admission: skin biopsy left wrist lesion 6/19 by dermatology, Dr. Rozann Lesches. Ulcerating lesions become painful after gradual enlargement and coalescence. Nonpruritic. ITP, TTP, DIC, anti-phospholipid syndrome unlikely. Platelet count and coagulation studies are normal. Unclear precipitant in this case, though inflammatory markers including RF are elevated and there is +FH of autoimmune disorders. Oddly despite hx +ANA, is negative on last check. However, would be odd to manifest so late in life and has no hx of same, no arthralgias, no typical Sjogren-type symptoms, or  ocular involvement. Does not seem to be complement-mediated. Cryoglobulinemia less like, hepatitis panel negative.  -local wound care - Keep follow up appointment with rheumatology, Dr. Dossie Der, on 7/10. Unfortunately, no consultative services are available at this hospital.  - Removed possible precipitant medications (eliquis, diltiazem). 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 Possibility of "mild perinephric stranding perhaps slightly worse on the right than the left" is mentioned in radiology report. - prior MD Dr. Bonner Puna spoke with urology, Dr. Tresa Moore who personally reviewed the images and feels no further work up or treatment should be necessary. Patient is urged to follow up with [facility] (known to the practice s/p bladder tack remotely) for consideration of cystoscopy.    History of MAI s/p 6 months Tx and resultant extensive bronchiectasis: AFB Cx, smear negative July 2022. CXR 6/23 showed RUL scarring without consolidation or pulmonary edema. No pulmonary complaints at this time. - Not currently symptomatic - Continue delsym, prn albuterol.    History of left breast CA s/p XRT, chemotherapy   Advanced age: Pt presented from [place].  -family working to get her into ALF   Glaucoma: Quiescent.  - Continue drops   Will ask CIR to review chart to see if candidate   DVT prophylaxis:  dabigatran (PRADAXA) capsule 150 mg    Code Status: Partial Code Family Communication: daughter on phone 7/4  Disposition Plan:  Level of care: Telemetry Cardiac Status is: Inpatient Remains inpatient appropriate because: needs IV abx    Consultants:  none   Subjective: Got pain medication prior to walk and did  well  Objective: Vitals:   03/15/22 0008 03/15/22 0411 03/15/22 0734 03/15/22 1133  BP: 134/68 127/72 (!) 121/58 119/60  Pulse: 71 67 80 68  Resp: _0 Temp: 98.1 F (36.7 C)  98.4 F (36.9 C) 97.7 F (36.5 C)  TempSrc: Oral  Oral Oral  SpO2: 99% 99% 96% 99%  Weight:      Height:        Intake/Output Summary (Last 24 hours) at 03/15/2022 1215 Last data filed at 03/14/2022 2107 Gross per 24 hour  Intake 480 ml  Output --  Net 480 ml    Filed Weights   03/10/22 1258  Weight: 48.7 kg    Examination:   General: Appearance:    Thin female in no acute distress- sitting in chair   B/l legs wrapped- dressing recently changed  Lungs:      respirations unlabored  Heart:    Normal heart rate.   MS:   All extremities are intact.   Neurologic:   Awake, alert, oriented x 3. No apparent focal neurological           defect.            Data Reviewed: I have personally reviewed following labs and imaging studies  CBC: Recent Labs  Lab 03/09/22 0130 03/10/22 1123 03/11/22 0231 03/12/22 0321 03/13/22 0246 03/15/22 0025  WBC 14.1* 20.6* 19.6* 16.4* 18.0* 11.2*  NEUTROABS 10.0*  --   --  12.8*  --   --   HGB 10.3* 11.0* 10.7* 10.1* 9.9* 10.5*  HCT 30.9* 34.6* 33.7* 31.3* 29.8* 31.7*  MCV 90.9 91.5 91.6 90.2 89.2 90.1  PLT 326 390 350 371 346 583   Basic Metabolic Panel: Recent Labs  Lab 03/11/22 0231 03/12/22 0321 03/13/22 0246 03/14/22 0335 03/15/22 0025  NA 131* 136 134* 134* 132*  K 4.2 4.2 3.6 3.9 3.9  CL 100 99 99 103 100  CO2 _1 GLUCOSE 94 93 96 81 167*  BUN _2 CREATININE 0.74 0.70 0.60 0.57 0.60  CALCIUM 8.3* 8.6* 8.1* 8.1* 8.2*  MG 2.0  --  2.1  --   --    GFR: Estimated Creatinine Clearance: 41 mL/min (by C-G formula based on SCr of 0.6 mg/dL). Liver Function Tests: Recent Labs  Lab 03/10/22 1123 03/11/22 0231  AST 25 21  ALT 21 19  ALKPHOS 109 92  BILITOT 1.6* 1.3*  PROT 8.3* 6.7  ALBUMIN 3.1* 2.4*   No  results for input(s): "LIPASE", "AMYLASE" in the last 168 hours. No results for input(s): "AMMONIA" in the last 168 hours. Coagulation Profile: No results for input(s): "INR", "PROTIME" in the last 168 hours. Cardiac Enzymes: No results for input(s): "CKTOTAL", "CKMB", "CKMBINDEX", "TROPONINI" in the last 168 hours. BNP (last 3 results) No results for input(s): "PROBNP" in the last 8760 hours. HbA1C: No results for input(s): "HGBA1C" in the last 72 hours. CBG: Recent Labs  Lab 03/10/22 1125  GLUCAP 155*   Lipid Profile: No results for input(s): "CHOL", "HDL", "LDLCALC", "TRIG", "CHOLHDL", "LDLDIRECT" in the last 72 hours. Thyroid Function Tests: No results for input(s): "TSH", "T4TOTAL", "FREET4", "T3FREE", "THYROIDAB" in the last 72 hours. Anemia Panel: No results for input(s): "VITAMINB12", "FOLATE", "FERRITIN", "TIBC", "IRON", "RETICCTPCT"  in the last 72 hours. Sepsis Labs: No results for input(s): "PROCALCITON", "LATICACIDVEN" in the last 168 hours.  Recent Results (from the past 240 hour(s))  Urine Culture     Status: Abnormal   Collection Time: 03/10/22  3:44 PM   Specimen: In/Out Cath Urine  Result Value Ref Range Status   Specimen Description IN/OUT CATH URINE  Final   Special Requests   Final    NONE Performed at [facility], [address] [place], [place] [address]    Culture 20,000 COLONIES/mL ENTEROCOCCUS FAECALIS (A)  Final   Report Status 03/12/2022 FINAL  Final   Organism ID, Bacteria ENTEROCOCCUS FAECALIS (A)  Final      Susceptibility   Enterococcus faecalis - MIC*    AMPICILLIN <=2 SENSITIVE Sensitive     NITROFURANTOIN <=16 SENSITIVE Sensitive     VANCOMYCIN 1 SENSITIVE Sensitive     * 20,000 COLONIES/mL ENTEROCOCCUS FAECALIS         Radiology Studies: No results found.      Scheduled Meds:  brinzolamide  1 drop Right Eye BID   cholecalciferol  2,000 Units Oral Once per day on Mon Wed Fri   dabigatran  150 mg Oral Q12H   digoxin   0.0625 mg Oral Daily   latanoprost  1 drop Right Eye QHS   leptospermum manuka honey  1 Application Topical Daily   metoprolol succinate  50 mg Oral Daily   pantoprazole  40 mg Oral Daily   predniSONE  40 mg Oral Q breakfast   Continuous Infusions:  [START ON 03/16/2022] cefTRIAXone (ROCEPHIN)  IV       LOS: 4 days    Time spent: 35 minutes spent on chart review, discussion with nursing staff, consultants, updating family and interview/physical exam; more than 50% of that time was spent in counseling and/or coordination of care.    Geradine Girt, DO [facility] Available via Epic secure chat 7am-7pm After these hours, please refer to coverage provider listed on [email or web address] 03/15/2022, 12:15 PM

## 2022-03-15 NOTE — Progress Notes (Signed)
Inpatient Rehab Admissions Coordinator:   Prescreen request received and chart reviewed.  Will need to discuss whether pt has medical necessity to support CIR admit, with rehab MD tomorrow when they return.  Will f/u.   Shann Medal, PT, DPT Admissions Coordinator [phone] 03/15/22  12:55 PM

## 2022-03-16 DIAGNOSIS — R410 Disorientation, unspecified: Secondary | ICD-10-CM | POA: Diagnosis not present

## 2022-03-16 DIAGNOSIS — L0390 Cellulitis, unspecified: Secondary | ICD-10-CM | POA: Diagnosis not present

## 2022-03-16 DIAGNOSIS — J479 Bronchiectasis, uncomplicated: Secondary | ICD-10-CM | POA: Diagnosis not present

## 2022-03-16 DIAGNOSIS — N3001 Acute cystitis with hematuria: Secondary | ICD-10-CM | POA: Diagnosis not present

## 2022-03-16 LAB — SEDIMENTATION RATE: Sed Rate: 88 mm/hr — ABNORMAL HIGH (ref 0–22)

## 2022-03-16 LAB — BASIC METABOLIC PANEL
Anion gap: 11 (ref 5–15)
BUN: 18 mg/dL (ref 8–23)
CO2: 23 mmol/L (ref 22–32)
Calcium: 8.3 mg/dL — ABNORMAL LOW (ref 8.9–10.3)
Chloride: 99 mmol/L (ref 98–111)
Creatinine, Ser: 0.57 mg/dL (ref 0.44–1.00)
GFR, Estimated: >60 mL/min (ref >60)
Glucose, Bld: 113 mg/dL — ABNORMAL HIGH (ref 70–99)
Potassium: 4 mmol/L (ref 3.5–5.1)
Sodium: 133 mmol/L — ABNORMAL LOW (ref 135–145)

## 2022-03-16 LAB — DIGOXIN LEVEL: Digoxin Level: 0.3 ng/mL — ABNORMAL LOW (ref 0.8–2.0)

## 2022-03-16 LAB — CBC
HCT: 30.3 % — ABNORMAL LOW (ref 36.0–46.0)
Hemoglobin: 9.9 g/dL — ABNORMAL LOW (ref 12.0–15.0)
MCH: 29.4 pg (ref 26.0–34.0)
MCHC: 32.7 g/dL (ref 30.0–36.0)
MCV: 89.9 fL (ref 80.0–100.0)
Platelets: 401 10*3/uL — ABNORMAL HIGH (ref 150–400)
RBC: 3.37 MIL/uL — ABNORMAL LOW (ref 3.87–5.11)
RDW: 13.2 % (ref 11.5–15.5)
WBC: 11.9 10*3/uL — ABNORMAL HIGH (ref 4.0–10.5)
nRBC: 0 % (ref 0.0–0.2)

## 2022-03-16 MED ORDER — AMOXICILLIN 500 MG PO CAPS
500.0000 mg | ORAL_CAPSULE | Freq: Two times a day (BID) | ORAL | Status: DC
  Administered 2022-03-16 – 2022-03-17 (×2): 500 mg via ORAL
  Filled 2022-03-16 (×2): qty 1

## 2022-03-16 MED ORDER — ORAL CARE MOUTH RINSE: 15.0000 mL | OROMUCOSAL | Status: DC | PRN

## 2022-03-16 MED ORDER — METHYLPREDNISOLONE ACETATE 40 MG/ML IJ SUSP
50.0000 mg | Freq: Once | INTRAMUSCULAR | Status: AC
Start: 2022-03-16 — End: 2022-03-16
  Administered 2022-03-16: 50 mg via INTRAMUSCULAR
  Filled 2022-03-16: qty 2

## 2022-03-16 NOTE — Care Management Important Message (Signed)
Important Message  Patient Details  Name: [name] MRN: [id number] Date of Birth: [birth year]   Medicare Important Message Given:  Yes     Shelda Altes 03/16/2022, 1:50 PM

## 2022-03-16 NOTE — TOC Progression Note (Signed)
Transition of Care [facility]) - Progression Note    Patient Details  Name: [name] Rice MRN: [id number] Date of Birth: [birth year]  [facility] of [facility]) CM/SW [place], [place] Phone Number: 03/16/2022, 2:51 PM  Clinical Narrative:    CSW spoke with pt's daughter Barbaraann Share to discuss SNF options, as CIR has signed off. Private pay options at [facility] and faxing pt out for a certified bed were discussed. [facility] pay at [facility] is about 350 a day. Daughter states she would like for pt to go to [facility] SNF and then transition to ALF. CSW notified facility and they are following, updated MD. Daughter notes she will transport pt at DC. TOC will continue to follow for DC needs.   Expected Discharge Plan: [place] Barriers to Discharge: Continued Medical Work up, SNF Pending bed offer, Ship broker  Expected Discharge Plan and Services Expected Discharge Plan: [place] Choice: [place] arrangements for the past 2 months: [place]                                       Social Determinants of Health (SDOH) Interventions    Readmission Risk Interventions     No data to display

## 2022-03-16 NOTE — Progress Notes (Signed)
Inpatient Rehab Admissions Coordinator:   Reviewed case with Dr. Ranell Patrick and Dr. Curlene Dolphin.  Pt does not have the medical necessity to support a CIR admission at this time.  Shann Medal, PT, DPT Admissions Coordinator [phone] 03/16/22  9:38 AM

## 2022-03-16 NOTE — Progress Notes (Addendum)
Progress Note  Patient: [name] Rice HYI:[id number] DOB: Aug 14, 1941  DOA: 03/10/2022  DOS: 03/16/2022    Brief hospital course: [name] Rice is an 81 y.o. female with a history of breast CA in remission, post-MAI bronchiectasis, PAF (NSR since DCCV 6/27), HFpEF with recent admission for decompensation, and neutrophilic dermatosis who presented to the ED 6/29 shortly after discharge the prior day with confusion and hallucinations as well as worsening pain in legs associated with rash. Urinalysis revealed pyuria, hematuria, and bacteriuria which is now showing to be E. faecalis on urine culture. Due to concern for metabolic encephalopathy, she was admitted and started on ceftriaxone for UTI and/or cellulitis. After discussion with rheumatology, prednisone was started 7/3.  Assessment and Plan: Acute metabolic encephalopathy: Concern initially for infection as etiology and mentation has cleared with antibiotic therapy. Could have been cellulitis as this responded to ceftriaxone x7 days. Polymicrobial UTI also possible which has been partially treated. CT head was normal at admission.  - Delirium precautions, has returned to baseline.  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 Rozann Lesches. Platelet count, coagulation studies unremarkable. +RF. Oddly despite hx +ANA, is negative on last check. Hepatitis panel negative.  - Keep follow up appointment with rheumatology, Dr. Dossie Der, on 7/10. - Removed possible precipitant medications (eliquis, diltiazem). - Note ESR is rising from 53 (6/24) > 88 (7/5). Patient noting possible lingual ulceration/wound, bilateral elbow arthralgias and some dry eye. Prednisone 53m daily initiated 7/3. I discussed with rheumatology, Dr. SDossie Der this afternoon who suggested changing to IM depomedrol 161mkg and should see some improvement within 24 hours. Ordered.   Lower extremity cellulitis:  - s/p 7 days ceftriaxone, can stop this  now.  UTI: Enterococcus faecalis growing on I/O culture.  - Change abx to amoxicillin based on susceptibilities, curb side consulted ID Dr. SiCandiss Norse Neutrophilic leukocytosis: Persistent, albeit improved, with 7 days of ceftriaxone. Urine culture and management discussed above. Also noting thrombocytosis favored to be reactive, vs. related to steroid - Suggest recheck CBC w/differential after completion of antibiotic course.    Microscopic hematuria: Unclear etiology. >20/HPF on UA. CT abd/pelvis performed last admission without mass or stone. Possibility of "mild perinephric stranding perhaps slightly worse on the right than the left" is mentioned in radiology report. The patient has no urinary symptoms or fever, etc. to suggest pyelonephritis.  - Will treat with culture-driven antibiotics as above and suggest recheck UA at follow up.  - Patient is urged to follow up with [facility] (known to the practice s/p bladder tack remotely) for consideration of cystoscopy given her smoking history.   Atrial fibrillation: Remaining in NSR since DCCV 6/27: Suspect return of sinus rhythm preserves cardiac output as she's euvolemic off lasix at this time. TSH 1.655.  - Continue metoprolol succinate 5044maily - Continue digoxin low dose. Level is 0.3. Will discuss with cardiology.  - Continue anticoagulation with dabigatran (CHA2DS2-VASc score is 3 age x2, sex]) - Keep K and Mg replete    Chronic HFpEF, moderate MR: Presented overloaded with wt 52.1kg, now down to 47.9kg at recent discharge. Appears euvolemic.   - Holding oral lasix for now - Will restart checking daily weights and keep close eye on I/O.  - Cardiology follow up 7/25 at 8:20am with Dr. BraHarl BowieHyponatremia: Mild.   Hyperbilirubinemia: Other LFTs wnl, recent CT abd/pelvis without causative finding.  - Recheck in AM  History of MAI s/p 6 months Tx and resultant extensive bronchiectasis:  AFB Cx, smear negative July 2022. CXR 6/23  showed RUL scarring without consolidation or pulmonary edema. No pulmonary complaints at this time. - Not currently symptomatic.  - Continue delsym, prn albuterol.    History of left breast CA: s/p XRT, chemotherapy    Glaucoma: Quiescent.  - Continue gtt's   Debility: Related to acute hospitalizations and pain in legs with ambulation. Not a candidate for inpatient rehabilitation.  - Pursuing ALF level of care at [facility] to be near her husband.  Subjective: Pain in legs is stable, worst with weight bearing and dressing changes. She reports 4-5 days of worsening tenderness in elbows and dry eyes, right eye specifically.   Objective: Vitals:   03/16/22 0022 03/16/22 0335 03/16/22 0833 03/16/22 1159  BP: 127/78 131/77 131/66 (!) 143/80  Pulse: 76 81 84 79  Resp: _0 Temp: 97.7 F (36.5 C) 97.7 F (36.5 C) (!) 97.5 F (36.4 C) (!) 97.5 F (36.4 C)  TempSrc: Oral Oral Oral Oral  SpO2: 99% 99% 100% 99%  Weight:      Height:       Gen: Pleasant elderly female in no distress Pulm: Nonlabored breathing room air. Clear CV: Regular rate and rhythm. No murmur, rub, or gallop. No JVD, trace pitting dependent edema. GI: Abdomen soft, non-tender, non-distended, with normoactive bowel sounds.  Ext: Warm, no deformities, though is tender to palpation and with ROM of bilateral elbows.  Skin: Blood blisters in various stages or development noted in lower legs without smaller, new lesions. Wrists/hands with some smaller lesions indicating newer spots. Left wrist biopsy site healing. There is also shallow ulceration on left tongue. Pictures taken this morning included below. Neuro: Alert and oriented. No focal neurological deficits. Psych: Judgement and insight appear fair. Mood euthymic & affect congruent. Behavior is appropriate.               Data Personally reviewed: CBC: Recent Labs  Lab 03/11/22 0231 03/12/22 0321 03/13/22 0246 03/15/22 0025 03/16/22 0049  WBC  19.6* 16.4* 18.0* 11.2* 11.9*  NEUTROABS  --  12.8*  --   --   --   HGB 10.7* 10.1* 9.9* 10.5* 9.9*  HCT 33.7* 31.3* 29.8* 31.7* 30.3*  MCV 91.6 90.2 89.2 90.1 89.9  PLT 350 371 346 372 416*   Basic Metabolic Panel: Recent Labs  Lab 03/11/22 0231 03/12/22 0321 03/13/22 0246 03/14/22 0335 03/15/22 0025 03/16/22 0049  NA 131* 136 134* 134* 132* 133*  K 4.2 4.2 3.6 3.9 3.9 4.0  CL 100 99 99 103 100 99  CO2 _1 GLUCOSE 94 93 96 81 167* 113*  BUN _2 CREATININE 0.74 0.70 0.60 0.57 0.60 0.57  CALCIUM 8.3* 8.6* 8.1* 8.1* 8.2* 8.3*  MG 2.0  --  2.1  --   --   --    GFR: Estimated Creatinine Clearance: 41 mL/min (by C-G formula based on SCr of 0.57 mg/dL). Liver Function Tests: Recent Labs  Lab 03/10/22 1123 03/11/22 0231  AST 25 21  ALT 21 19  ALKPHOS 109 92  BILITOT 1.6* 1.3*  PROT 8.3* 6.7  ALBUMIN 3.1* 2.4*   Urine analysis:    Component Value Date/Time   COLORURINE AMBER (A) 03/10/2022 1153   APPEARANCEUR HAZY (A) 03/10/2022 1153   LABSPEC 1.029 03/10/2022 1153   PHURINE 5.0 03/10/2022 1153   GLUCOSEU NEGATIVE 03/10/2022 1153   HGBUR LARGE (A) 03/10/2022 1153  BILIRUBINUR NEGATIVE 03/10/2022 1153   KETONESUR NEGATIVE 03/10/2022 1153   PROTEINUR 100 (A) 03/10/2022 1153   NITRITE NEGATIVE 03/10/2022 1153   LEUKOCYTESUR MODERATE (A) 03/10/2022 1153   Urine culture 6/29: 20k Enterococcus faecalis sensitive to amp, macrobid, vancomycin.     Family Communication: Daughter by phone  Disposition: Status is: Inpatient pending safe disposition Planned Discharge Destination: Skilled nursing facility  Patrecia Pour, MD 03/16/2022 4:09 PM Page by [email or web address]

## 2022-03-17 ENCOUNTER — Other Ambulatory Visit: Payer: Self-pay | Admitting: Adult Health

## 2022-03-17 ENCOUNTER — Encounter (HOSPITAL_COMMUNITY): Payer: Self-pay | Admitting: Internal Medicine

## 2022-03-17 DIAGNOSIS — J479 Bronchiectasis, uncomplicated: Secondary | ICD-10-CM | POA: Diagnosis not present

## 2022-03-17 DIAGNOSIS — R410 Disorientation, unspecified: Secondary | ICD-10-CM | POA: Diagnosis not present

## 2022-03-17 DIAGNOSIS — N3001 Acute cystitis with hematuria: Secondary | ICD-10-CM | POA: Diagnosis not present

## 2022-03-17 DIAGNOSIS — L0390 Cellulitis, unspecified: Secondary | ICD-10-CM | POA: Diagnosis not present

## 2022-03-17 DIAGNOSIS — D692 Other nonthrombocytopenic purpura: Secondary | ICD-10-CM

## 2022-03-17 LAB — COMPREHENSIVE METABOLIC PANEL
ALT: 28 U/L (ref 0–44)
AST: 36 U/L (ref 15–41)
Albumin: 2 g/dL — ABNORMAL LOW (ref 3.5–5.0)
Alkaline Phosphatase: 79 U/L (ref 38–126)
Anion gap: 10 (ref 5–15)
BUN: 18 mg/dL (ref 8–23)
CO2: 22 mmol/L (ref 22–32)
Calcium: 8.2 mg/dL — ABNORMAL LOW (ref 8.9–10.3)
Chloride: 100 mmol/L (ref 98–111)
Creatinine, Ser: 0.73 mg/dL (ref 0.44–1.00)
GFR, Estimated: >60 mL/min (ref >60)
Glucose, Bld: 122 mg/dL — ABNORMAL HIGH (ref 70–99)
Potassium: 4.1 mmol/L (ref 3.5–5.1)
Sodium: 132 mmol/L — ABNORMAL LOW (ref 135–145)
Total Bilirubin: 0.5 mg/dL (ref 0.3–1.2)
Total Protein: 6 g/dL — ABNORMAL LOW (ref 6.5–8.1)

## 2022-03-17 MED ORDER — HYDROCODONE-ACETAMINOPHEN 10-325 MG PO TABS: 0.5000 | ORAL_TABLET | Freq: Four times a day (QID) | ORAL | 0 refills | Status: DC | PRN

## 2022-03-17 MED ORDER — PREDNISONE 50 MG PO TABS: 50.0000 mg | ORAL_TABLET | Freq: Every day | ORAL | 0 refills | Status: DC

## 2022-03-17 MED ORDER — HYDROCODONE-ACETAMINOPHEN 5-325 MG PO TABS: 1.0000 | ORAL_TABLET | Freq: Four times a day (QID) | ORAL | 0 refills | Status: DC | PRN

## 2022-03-17 MED ORDER — METHYLPREDNISOLONE ACETATE 40 MG/ML IJ SUSP
50.0000 mg | Freq: Once | INTRAMUSCULAR | Status: AC
Start: 2022-03-17 — End: 2022-03-17
  Administered 2022-03-17: 50 mg via INTRAMUSCULAR
  Filled 2022-03-17: qty 2

## 2022-03-17 MED ORDER — AMOXICILLIN 500 MG PO CAPS: 500.0000 mg | ORAL_CAPSULE | Freq: Two times a day (BID) | ORAL | 0 refills | Status: DC

## 2022-03-17 MED ORDER — FUROSEMIDE 20 MG PO TABS
20.0000 mg | ORAL_TABLET | Freq: Every day | ORAL | Status: DC | PRN
Start: 2022-03-17 — End: 2022-03-21

## 2022-03-17 MED ORDER — POTASSIUM CHLORIDE 20 MEQ/15ML (10%) PO SOLN: 20.0000 meq | Freq: Every day | ORAL | Status: DC | PRN

## 2022-03-17 MED ORDER — MAGIC MOUTHWASH W/LIDOCAINE: 5.0000 mL | Freq: Three times a day (TID) | ORAL | 0 refills | Status: DC

## 2022-03-17 MED ORDER — HYDROCODONE-ACETAMINOPHEN 5-325 MG PO TABS
1.0000 | ORAL_TABLET | Freq: Four times a day (QID) | ORAL | 0 refills | Status: DC | PRN
Start: 2022-03-17 — End: 2023-03-03

## 2022-03-17 NOTE — Discharge Summary (Signed)
Physician Discharge Summary   Patient: [name] Rice MRN: [id number] DOB: 1941/02/26  Admit date:     03/10/2022  Discharge date: 03/17/22  Discharge Physician: Patrecia Pour   PCP: Lavone Orn, MD   Recommendations at discharge:  Follow up with rheumatology, Dr. Dossie Der on 7/10. 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  Suggest recheck CBC w/differential, BMP, magnesium, ESR, and digoxin level next week. Follow up with cardiology as scheduled later this month for AFib, maintaining NSR s/p DCCV, and CHF (holding scheduled lasix since euvolemic at this time).  Recommend follow up with [facility] for consideration of cystoscopy for persistent hematuria. CT abd/pelvis without causative lesion identified. Suggest recheck urinalysis after 1 week of culture-specific antibiotics.   Discharge Diagnoses: Principal Problem:   UTI (urinary tract infection) Active Problems:   Bronchiectasis without complication ([place])   Palpable purpura ([place])   Cellulitis   Confusion   Hyponatremia  Hospital Course: [name] Rice is an 81 y.o. female with a history of breast CA in remission, post-MAI bronchiectasis, PAF (NSR since DCCV 6/27), HFpEF with recent admission for decompensation, and neutrophilic dermatosis who presented to the ED 6/29 shortly after discharge the prior day with confusion and hallucinations as well as worsening pain in legs associated with rash. Urinalysis revealed pyuria, hematuria, and bacteriuria which is now showing to be E. faecalis on urine culture. Due to concern for metabolic encephalopathy, she was admitted and started on ceftriaxone for UTI and cellulitis. 7 days of ceftriaxone have been given with resolution of cellulitis. Urine culture grew Enterococcus for which amoxicillin is prescribed.   [name] Rice developed elbow arthralgias and a single ruptured blister/ulcer on the tongue.  Prednisone was started 7/3, augmented dose after discussion with rheumatology on 7/5, and prescribed to continue until rheumatology follow up.   She will benefit from a skilled nursing level of care at discharge, which is arranged.  Assessment and Plan: Acute metabolic encephalopathy: Concern initially for infection as etiology and mentation has cleared with antibiotic therapy. CT head was normal at admission.  - Delirium precautions, has returned to baseline.   Neutrophilic dermatosis: Now with polyarthralgia (elbows) and single mucosal lesion (left tongue). Dx currently based on skin biopsy left wrist lesion 6/19 by dermatology, Dr. Rozann Lesches. Platelet count, coagulation studies unremarkable. Unclear precipitant in this case, though inflammatory markers including RF are elevated and there is +FH of autoimmune disorders. Oddly despite hx +ANA, is negative on last check. However, would be odd to manifest so late in life and has no hx of same. Does not seem to be complement-mediated. Cryoglobulinemia less likely, hepatitis panel negative. Despite leukocytosis, remaining afebrile with no Osler nodes, Janeway lesions, no cardiac vegetations, and negative blood cultures overall argues against infectious embolic phenomenon.  - Keep follow up appointment with rheumatology, Dr. Dossie Der, on 7/10. - Removed possible precipitant medications (eliquis, diltiazem). - Note ESR is rising from 53 (6/24) > 88 (7/5). Patient noting possible lingual ulceration/wound, bilateral elbow arthralgias and some dry eye. Prednisone 80m daily initiated 7/3. I discussed with rheumatology, Dr. SDossie Der who suggested changing to IM depomedrol and discharging on prednisone 180mkg.  - Tylenol prn mild-moderate pain. Norco prn severe pain prescribed at discharge to facilitate continued PT efforts.   Lower extremity cellulitis:  - s/p 7 days ceftriaxone, resolved.   UTI: Enterococcus faecalis growing on I/O culture.  - Rx 7 days  amoxicillin based on susceptibilities, curb side consulted ID  Dr. Candiss Norse.   Neutrophilic leukocytosis: Persistent, albeit improved, with 7 days of ceftriaxone. Urine culture and management discussed above. Also noting thrombocytosis favored to be reactive, vs. related to steroid - Suggest recheck CBC w/differential after completion of antibiotic course.    Microscopic hematuria: Unclear etiology. >20/HPF on UA. CT abd/pelvis performed last admission without mass or stone. Possibility of "mild perinephric stranding perhaps slightly worse on the right than the left" is mentioned in radiology report. The patient has no urinary symptoms or fever, etc. to suggest pyelonephritis.  - Will treat with culture-driven antibiotics as above and suggest recheck UA at follow up.  - Patient is urged to follow up with [facility] (known to the practice s/p bladder tack remotely) for consideration of cystoscopy given her smoking history.    Atrial fibrillation: Remaining in NSR since DCCV 6/27: Suspect return of sinus rhythm preserves cardiac output as she's euvolemic off lasix at this time. TSH 1.655.  - Continue metoprolol succinate 83m daily - Continue digoxin low dose. Level is 0.3. Will discuss with cardiology.  - Continue anticoagulation with dabigatran (CHA2DS2-VASc score is 3 age x2, sex]) - Keep K and Mg replete    Chronic HFpEF, moderate MR: Presented overloaded with wt 52.1kg, now down to 47.9kg at recent discharge. Appears euvolemic.   - Holding oral lasix for now - Will restart checking daily weights and keep close eye on I/O.  - Cardiology follow up 7/25 at 8:20am with Dr. BHarl Bowie   Hyponatremia: Mild.    Hyperbilirubinemia: Other LFTs wnl, recent CT abd/pelvis without causative finding. Resolved on recheck.  Hypoalbuminemia:  - Supplement protein as much as possible.  - To facilitate oral intake, magic mouthwash was prescribed TIDAC to help with lingual pain.   History of MAI s/p 6  months Tx and resultant extensive bronchiectasis: AFB Cx, smear negative July 2022. CXR 6/23 showed RUL scarring without consolidation or pulmonary edema. No pulmonary complaints at this time, stable. - Continue delsym, prn albuterol.    History of left breast CA: s/p XRT, chemotherapy    Glaucoma: Quiescent.  - Continue gtt's   Debility: Related to acute hospitalizations and pain in legs with ambulation. Not a candidate for inpatient rehabilitation.  - Pursuing SNF level of care at [facility] to be closer to husband.  Pain control - [place] was reviewed. and patient was instructed, not to drive, operate heavy machinery, perform activities at heights, swimming or participation in water activities or provide baby-sitting services while on Pain, Sleep and Anxiety Medications; until their outpatient Physician has advised to do so again. Also recommended to not to take more than prescribed Pain, Sleep and Anxiety Medications.   Consultants: Rheumatology by phone only. ID and cardiology curb side consults. Procedures performed: None  Disposition: Skilled nursing facility Diet recommendation:  Discharge Diet Orders (From admission, onward)     Start     Ordered   03/17/22 0000  Diet - low sodium heart healthy        03/17/22 1206           Cardiac diet DISCHARGE MEDICATION: Allergies as of 03/17/2022       Reactions   Apraclonidine Other (See Comments)   Redness of eye   Iodine Other (See Comments)   Iodine in eye drops - redness of eyes        Medication List     TAKE these medications  acetaminophen 325 MG tablet Commonly known as: TYLENOL Take 325 mg by mouth every 6 (six) hours as needed for mild pain.   albuterol 108 (90 Base) MCG/ACT inhaler Commonly known as: VENTOLIN HFA INHALE 2 PUFFS INTO THE LUNGS EVERY 6 HOURS AS NEEDED FOR WHEEZING/SHORNTESS  OF BREATH What changed: See the new instructions.   amoxicillin 500 MG capsule Commonly known as: AMOXIL Take 1 capsule (500 mg total) by mouth every 12 (twelve) hours for 7 days.   Azopt 1 % ophthalmic suspension Generic drug: brinzolamide Place 1 drop into the right eye 2 (two) times daily.   Cholecalciferol 50 MCG (2000 UT) Caps Take 2,000 Units by mouth 3 (three) times a week.   dabigatran 150 MG Caps capsule Commonly known as: PRADAXA Take 1 capsule (150 mg total) by mouth every 12 (twelve) hours.   DELSYM PO Take 10 mLs by mouth at bedtime.   digoxin 0.125 MG tablet Commonly known as: LANOXIN Take 0.5 tablets (0.0625 mg total) by mouth daily.   furosemide 20 MG tablet Commonly known as: LASIX Take 1 tablet (20 mg total) by mouth daily as needed for edema. What changed:  when to take this reasons to take this   HYDROcodone-acetaminophen 5-325 MG tablet Commonly known as: NORCO/VICODIN Take 1 tablet by mouth every 6 (six) hours as needed for moderate pain or severe pain.   latanoprost 0.005 % ophthalmic solution Commonly known as: XALATAN Place 1 drop into the right eye at bedtime.   magic mouthwash w/lidocaine Soln Take 5 mLs by mouth 3 (three) times daily before meals.   metoprolol succinate 50 MG 24 hr tablet Commonly known as: TOPROL-XL Take 50 mg by mouth daily.   potassium chloride 20 MEQ/15ML (10%) Soln Take 15 mLs (20 mEq total) by mouth daily as needed (when taking lasix). What changed:  when to take this reasons to take this   predniSONE 50 MG tablet Commonly known as: DELTASONE Take 1 tablet (50 mg total) by mouth daily.   sodium chloride HYPERTONIC 3 % nebulizer solution Take by nebulization daily. What changed:  how much to take when to take this reasons to take this               Discharge Care Instructions  (From admission, onward)           Start     Ordered   03/17/22 0000  Discharge wound care:       Comments:  Cleanse wounds to lower legs with normal saline. Pat dry then apply a nickel thick layer of MediHoney (or formulary equivalent) directly to the wound or onto a dressing. Make certain that the dressing covers the entire wound base, but not in contact with the peri-wound skin.  Next, cover with nonadherent gauze and secure with kerlix/tape.  Change dressing daily.   03/17/22 1206            Follow-up Information     Lavone Orn, MD Follow up.   Specialty: Internal Medicine Contact information: [address] [place] [address] [phone]         Valinda Party, MD. Go on 03/21/2022.   Specialty: Rheumatology Contact information: [address] [place] [address] [phone]                Discharge Exam: BP (!) 117/57 (BP Location: Right Arm)   Pulse 63   Temp 97.8 F (36.6 C) (Oral)   Resp 19  Ht 5' 2" (1.575 m)   Wt 48.7 kg   SpO2 99%   BMI 19.63 kg/m   Elderly pleasant female in no distress RRR Clear, nonlabored Tender swelling around R > L elbows without significant erythema.  Shallow ulceration on left tongue, palpable eruption on upper and lower extremities as shown.              Alert, oriented, no focal deficits  Condition at discharge: stable  Labs 02/25/2022: Rheumatoid Factor    50.6 (nl < 14) CRP                            44 (ULN is 10) ESR                            45 (was 28 on 5/22, 33 on 5/3) CMP largely unremarkable (Na 135, Ca 8.6, Cr 0.66) WBC 7.6k (PMNs 4.7, Eo's 0.1), hgb 11.0, plt 316k Blood cultures and urine culture no growth   Labs 01/12/2022:  C3 114 (82-167), C4 15 (12-38) cANCA, pANCA, ANA: Negative. Hep B sAg, and Ab negative.  CRP 30   Labs 01/10/2022:  PT/INR, platelet count: WNL.  Imaging Studies: DG Chest Portable 1 View  Result Date: 03/10/2022 CLINICAL DATA:  Altered mental status, history of breast cancer in bronchiectasis EXAM: PORTABLE CHEST 1 VIEW  COMPARISON:  Portable exam 1303 hours compared to 03/04/2022 FINDINGS: Normal heart size, mediastinal contours, and pulmonary vascularity. Chronic bronchitic and interstitial changes similar to prior study. No definite acute infiltrate, pleural effusion, or pneumothorax. Bones demineralized. Atherosclerotic calcifications aorta. IMPRESSION: Chronic bronchitic and interstitial changes without acute infiltrate. Aortic Atherosclerosis (ICD10-I70.0). Electronically Signed   By: Lavonia Dana M.D.   On: 03/10/2022 13:13   CT HEAD WO CONTRAST (5MM)  Result Date: 03/10/2022 CLINICAL DATA:  Mental status change, unknown cause EXAM: CT HEAD WITHOUT CONTRAST TECHNIQUE: Contiguous axial images were obtained from the base of the skull through the vertex without intravenous contrast. RADIATION DOSE REDUCTION: This exam was performed according to the departmental dose-optimization program which includes automated exposure control, adjustment of the mA and/or kV according to patient size and/or use of iterative reconstruction technique. COMPARISON:  None Available. FINDINGS: Brain: There is no acute intracranial hemorrhage, mass effect, or edema. Gray-white differentiation is preserved. There is no extra-axial fluid collection. Prominence of the ventricles and sulci reflects mild parenchymal volume loss. Patchy and confluent hypoattenuation in the supratentorial white matter is nonspecific but may reflect mild to moderate chronic microvascular ischemic changes. Vascular: There is atherosclerotic calcification at the skull base. Skull: Calvarium is unremarkable. Sinuses/Orbits: No acute finding. Other: None. IMPRESSION: No acute intracranial abnormality. Chronic microvascular ischemic changes. Electronically Signed   By: Macy Mis M.D.   On: 03/10/2022 12:35   CT ABDOMEN PELVIS WO CONTRAST  Result Date: 03/08/2022 CLINICAL DATA:  An 81 year old female presents for evaluation of the abdomen in the setting of microscopic  hematuria. EXAM: CT ABDOMEN AND PELVIS WITHOUT CONTRAST TECHNIQUE: Multidetector CT imaging of the abdomen and pelvis was performed following the standard protocol without IV contrast. RADIATION DOSE REDUCTION: This exam was performed according to the departmental dose-optimization program which includes automated exposure control, adjustment of the mA and/or kV according to patient size and/or use of iterative reconstruction technique. COMPARISON:  CT of the chest from Feb 04, 2021. FINDINGS: Lower chest: Areas of mucoid impaction in branching opacities at the  RIGHT and LEFT lung base. No pleural effusion. No dense consolidative changes. Appearance is improved based on comparison with imaging from Feb 04, 2021. Hepatobiliary: Smooth hepatic contours. No pericholecystic stranding. Cholelithiasis with moderately large gallstones up to 13 mm greatest dimension throughout the dependent gallbladder. Pancreas: Normal contour without signs of adjacent inflammation. Spleen: Normal. Adrenals/Urinary Tract: Adrenal glands are normal. Mild perinephric stranding perhaps slightly worse on the RIGHT than the LEFT. No hydronephrosis. No visible ureteral calculi though lack of retroperitoneal and intra-abdominal fat limits assessment. No perivesical stranding. Stomach/Bowel: No gross perigastric stranding. No signs of small bowel obstruction. No signs of small bowel inflammation. Appendix not visible though there are no signs to suggest acute appendiceal process in the RIGHT lower quadrant. Colon is under distended limiting assessment. Vascular/Lymphatic: Aortic atherosclerosis. No sign of aneurysm. Smooth contour of the IVC. There is no gastrohepatic or hepatoduodenal ligament lymphadenopathy. No retroperitoneal or mesenteric lymphadenopathy. No pelvic sidewall lymphadenopathy. Limited assessment due to lack of intravenous contrast. Reproductive: Unremarkable by CT. Other: No ascites.  No pneumoperitoneum. Musculoskeletal: No  acute bone finding. No destructive bone process. Spinal degenerative changes. IMPRESSION: 1. Mild perinephric stranding perhaps slightly worse on the RIGHT than the LEFT. No visible ureteral calculi though lack of retroperitoneal and intra-abdominal fat limits assessment. Correlate with urinalysis to exclude urinary tract infection/RIGHT-sided pyelonephritis. Would also correlate with urinalysis and with symptoms. 2. No signs of nephrolithiasis or ureteral calculi. 3. Improving appearance of areas of mucoid impaction in multi nodularity at the lung bases likely related to chronic infection, potentially MAI. 4. Cholelithiasis. 5. Aortic atherosclerosis. Aortic Atherosclerosis (ICD10-I70.0). Electronically Signed   By: Zetta Bills M.D.   On: 03/08/2022 12:43   ECHOCARDIOGRAM LIMITED  Result Date: 03/05/2022    ECHOCARDIOGRAM LIMITED REPORT   Patient Name:   MILA PAIR Date of Exam: 03/05/2022 Medical Rec #:  [id number]        Height:       62.0 in Accession #:    [id number]       Weight:       112.9 lb Date of Birth:  [birth year]         BSA:          1.499 m Patient Age:    82 years         BP:           100/68 mmHg Patient Gender: F                HR:           110 bpm. Exam Location:  Inpatient Procedure: Limited Echo and Limited Color Doppler Indications:    X48.01 Acute diastolic (congestive) heart failure  History:        Patient has prior history of Echocardiogram examinations, most                 recent 11/10/2021. Arrythmias:Atrial Fibrillation; Risk                 Factors:Former Smoker. History of left breast cancer.  Sonographer:    Wilkie Aye RVT Referring Phys: [id number] Darreld Mclean  Sonographer Comments: Technically difficult study due to poor echo windows. IMPRESSIONS  1. Left ventricular ejection fraction, by estimation, is 50 to 55%. The left ventricle has low normal function.  2. RV / RA gradient estimaged to be 31 mmHg. Dilated IVC - RA pressure estimated at 15 mm Hg.     Estimaged PA  pressure of 46  mmHg. . There is moderately elevated pulmonary artery systolic pressure.  3. Left atrial size was mildly dilated.  4. Moderate to severe mitral valve regurgitation.  5. Tricuspid valve regurgitation is moderate.  6. The [place] is heavily calcified . The aortic valve is tricuspid. There is mild calcification of the aortic valve. Comparison(s): 11/10/21-EF 60-65%. FINDINGS  Left Ventricle: Left ventricular ejection fraction, by estimation, is 50 to 55%. The left ventricle has low normal function. Right Ventricle: RV / RA gradient estimaged to be 31 mmHg. Dilated IVC - RA pressure estimated at 15 mm Hg. Estimaged PA pressure of 46 mmHg. There is moderately elevated pulmonary artery systolic pressure. Left Atrium: Left atrial size was mildly dilated. Right Atrium: Right atrial size was normal in size. Mitral Valve: Moderate to severe mitral valve regurgitation. Tricuspid Valve: Tricuspid valve regurgitation is moderate. Aortic Valve: The [place] is heavily calcified. The aortic valve is tricuspid. There is mild calcification of the aortic valve. Aorta: The aortic root and ascending aorta are structurally normal, with no evidence of dilitation. IAS/Shunts: The atrial septum is grossly normal. LEFT VENTRICLE PLAX 2D LVIDd:         3.90 cm LVIDs:         2.80 cm LV PW:         0.80 cm LV IVS:        0.75 cm LVOT diam:     1.90 cm LVOT Area:     2.84 cm  LV Volumes (MOD) LV vol d, MOD A4C: 58.6 ml LV vol s, MOD A4C: 30.9 ml LV SV MOD A4C:     58.6 ml IVC IVC diam: 2.30 cm LEFT ATRIUM           Index        RIGHT ATRIUM           Index LA diam:      4.00 cm 2.67 cm/m   RA Area:     15.70 cm LA Vol (A2C): 25.8 ml 17.21 ml/m  RA Volume:   43.20 ml  28.81 ml/m LA Vol (A4C): 55.6 ml 37.09 ml/m   AORTA Ao Root diam: 2.80 cm Ao Asc diam:  3.30 cm  SHUNTS Systemic Diam: 1.90 cm Mertie Moores MD Electronically signed by Mertie Moores MD Signature Date/Time: 03/05/2022/1:21:45 PM    Final    DG CHEST PORT 1  VIEW  Result Date: 03/04/2022 CLINICAL DATA:  Difficulty breathing EXAM: PORTABLE CHEST 1 VIEW COMPARISON:  01/12/2022 FINDINGS: Transverse diameter of heart is slightly increased. There are no signs of alveolar pulmonary edema or focal pulmonary consolidation. Small linear densities in the lateral aspect of right upper lung fields may suggest scarring. There is interval decrease in interstitial markings in both lungs. There is blunting of left lateral CP angle. There is no pneumothorax. IMPRESSION: There are no signs of alveolar pulmonary edema or new focal infiltrates. Possible small left pleural effusion. Electronically Signed   By: Elmer Picker M.D.   On: 03/04/2022 16:05    Microbiology: Results for orders placed or performed during the hospital encounter of 03/10/22  Urine Culture     Status: Abnormal   Collection Time: 03/10/22  3:44 PM   Specimen: In/Out Cath Urine  Result Value Ref Range Status   Specimen Description IN/OUT CATH URINE  Final   Special Requests   Final    NONE Performed at [facility], [place] [address], , [place] [address]    Culture 20,000 COLONIES/mL ENTEROCOCCUS  FAECALIS (A)  Final   Report Status 03/12/2022 FINAL  Final   Organism ID, Bacteria ENTEROCOCCUS FAECALIS (A)  Final      Susceptibility   Enterococcus faecalis - MIC*    AMPICILLIN <=2 SENSITIVE Sensitive     NITROFURANTOIN <=16 SENSITIVE Sensitive     VANCOMYCIN 1 SENSITIVE Sensitive     * 20,000 COLONIES/mL ENTEROCOCCUS FAECALIS    Labs: CBC: Recent Labs  Lab 03/11/22 0231 03/12/22 0321 03/13/22 0246 03/15/22 0025 03/16/22 0049  WBC 19.6* 16.4* 18.0* 11.2* 11.9*  NEUTROABS  --  12.8*  --   --   --   HGB 10.7* 10.1* 9.9* 10.5* 9.9*  HCT 33.7* 31.3* 29.8* 31.7* 30.3*  MCV 91.6 90.2 89.2 90.1 89.9  PLT 350 371 346 372 373*   Basic Metabolic Panel: Recent Labs  Lab 03/11/22 0231 03/12/22 0321 03/13/22 0246 03/14/22 0335 03/15/22 0025 03/16/22 0049 03/17/22 0059   NA 131*   < > 134* 134* 132* 133* 132*  K 4.2   < > 3.6 3.9 3.9 4.0 4.1  CL 100   < > 99 103 100 99 100  CO2 25   < > _0 GLUCOSE 94   < > 96 81 167* 113* 122*  BUN 15   < > _1 CREATININE 0.74   < > 0.60 0.57 0.60 0.57 0.73  CALCIUM 8.3*   < > 8.1* 8.1* 8.2* 8.3* 8.2*  MG 2.0  --  2.1  --   --   --   --    < > = values in this interval not displayed.   Liver Function Tests: Recent Labs  Lab 03/11/22 0231 03/17/22 0059  AST 21 36  ALT 19 28  ALKPHOS 92 79  BILITOT 1.3* 0.5  PROT 6.7 6.0*  ALBUMIN 2.4* 2.0*   CBG: No results for input(s): "GLUCAP" in the last 168 hours.  Discharge time spent: greater than 30 minutes.  Signed: Patrecia Pour, MD [facility] 03/17/2022

## 2022-03-17 NOTE — Progress Notes (Signed)
Report called in to [facility], Hannah Beat RN.  PIV removed.   Daughter here to transport patient.

## 2022-03-17 NOTE — TOC Transition Note (Addendum)
Transition of Care [facility]) - CM/SW Discharge Note   Patient Details  Name: [name] Rice MRN: [id number] Date of Birth: 04/26/1941  [facility] CM/SW Contact:  Coralee Pesa, [place] Phone Number: 03/17/2022, 1:48 PM   Clinical Narrative:    Pt to be transported to [facility] via daughter. Nurse to call report to [phone] Ext: [phone] or Ext: [phone] Please send DC packet with daughter. Final next level of care: Skilled Nursing Facility Barriers to Discharge: Barriers Resolved   Patient Goals and CMS Choice Patient states their goals for this hospitalization and ongoing recovery are:: Pt would like to recieve care near her husband. CMS Medicare.gov Compare Post Acute Care list provided to:: Patient Choice offered to / list presented to : Patient  Discharge Placement              Patient chooses bed at: [facility] Patient to be transferred to facility by: Daughter Name of family member notified: [name] Patient and family notified of of transfer: 03/17/22  Discharge Plan and Services     Post Acute Care Choice: [place]                               Social Determinants of Health (SDOH) Interventions     Readmission Risk Interventions     No data to display

## 2022-03-17 NOTE — Progress Notes (Signed)
Physical Therapy Treatment Patient Details Name: [name] Rice MRN: [id number] DOB: Apr 11, 1941 Today's Date: 03/17/2022   History of Present Illness Pt is an 81 y/o female who presents back to the ED 03/10/22 after cardioversion where she was discharged on 6/28 back to her ALF, and developed AMS and LE swelling/pain. Concern for cellulitis, and UA suggestive of UTI. PMH significant for bronchiectasis, breast CA in remission, glaucoma.    PT Comments    Progressing well toward goals.  Emphasis on transitions, sit to stands and progression of gait with assist for posterior bias and in the case of ambulation, mild retropulsion.    Recommendations for follow up therapy are one component of a multi-disciplinary discharge planning process, led by the attending physician.  Recommendations may be updated based on patient status, additional functional criteria and insurance authorization.  Follow Up Recommendations  Skilled nursing-short term rehab (<3 hours/day) Can patient physically be transported by private vehicle: Yes   Assistance Recommended at Discharge Frequent or constant Supervision/Assistance  Patient can return home with the following A little help with walking and/or transfers;A little help with bathing/dressing/bathroom;Assistance with cooking/housework;Assist for transportation   Equipment Recommendations  Rolling walker (2 wheels);BSC/3in1    Recommendations for Other Services       Precautions / Restrictions Precautions Precautions: Fall     Mobility  Bed Mobility Overal bed mobility: Needs Assistance Bed Mobility: Supine to Sit     Supine to sit: Min guard          Transfers Overall transfer level: Needs assistance Equipment used: Rolling walker (2 wheels) Transfers: Sit to/from Stand Sit to Stand: Min assist           General transfer comment: stability assist due to mild posterior bias.    Ambulation/Gait Ambulation/Gait assistance: Min assist,  Min guard     Gait Pattern/deviations: Step-through pattern   Gait velocity interpretation: 1.31 - 2.62 ft/sec, indicative of limited community ambulator   General Gait Details: slow and mildly retropulsive, work on improving heel /toe pattern and propulsion, generally needing minimal stability assist.   Stairs             Wheelchair Mobility    Modified Rankin (Stroke Patients Only)       Balance Overall balance assessment: Needs assistance Sitting-balance support: No upper extremity supported Sitting balance-Leahy Scale: Good     Standing balance support: Single extremity supported, Bilateral upper extremity supported, During functional activity Standing balance-Leahy Scale: Poor Standing balance comment: reliant on external support and/or AD                            Cognition Arousal/Alertness: Awake/alert Behavior During Therapy: WFL for tasks assessed/performed Overall Cognitive Status: Within Functional Limits for tasks assessed                                          Exercises      General Comments        Pertinent Vitals/Pain Pain Assessment Pain Assessment: Faces Faces Pain Scale: Hurts a little bit Pain Location: general Pain Descriptors / Indicators: Discomfort Pain Intervention(s): Monitored during session    Home Living                          Prior Function  PT Goals (current goals can now be found in the care plan section) Acute Rehab PT Goals Patient Stated Goal: return to friends home PT Goal Formulation: With patient Time For Goal Achievement: 03/26/22 Potential to Achieve Goals: Good Progress towards PT goals: Progressing toward goals    Frequency    Min 3X/week      PT Plan Current plan remains appropriate    Co-evaluation              AM-PAC PT "6 Clicks" Mobility   Outcome Measure  Help needed turning from your back to your side while in a flat bed  without using bedrails?: A Little Help needed moving from lying on your back to sitting on the side of a flat bed without using bedrails?: A Little Help needed moving to and from a bed to a chair (including a wheelchair)?: A Little Help needed standing up from a chair using your arms (e.g., wheelchair or bedside chair)?: A Little Help needed to walk in hospital room?: A Little Help needed climbing 3-5 steps with a railing? : A Little 6 Click Score: 18    End of Session   Activity Tolerance: Patient tolerated treatment well Patient left: in chair;with call bell/phone within reach;with chair alarm set Nurse Communication: Mobility status PT Visit Diagnosis: Other abnormalities of gait and mobility (R26.89);Unsteadiness on feet (R26.81) Pain - Right/Left:  (bilaterally) Pain - part of body: Leg     Time: 2998-0699 PT Time Calculation (min) (ACUTE ONLY): 21 min  Charges:  $Gait Training: 8-22 mins                     03/17/2022  Ginger Carne., PT Acute Rehabilitation Services [phone]  (pager) [phone]  (office)   Tessie Fass Jacquelina Hewins 03/17/2022, 4:00 PM

## 2022-03-17 NOTE — Progress Notes (Signed)
[facility] discharged; all belongings sent with patient and daughter.

## 2022-03-18 DIAGNOSIS — R278 Other lack of coordination: Secondary | ICD-10-CM | POA: Diagnosis not present

## 2022-03-18 DIAGNOSIS — J479 Bronchiectasis, uncomplicated: Secondary | ICD-10-CM | POA: Diagnosis not present

## 2022-03-18 DIAGNOSIS — L982 Febrile neutrophilic dermatosis [Sweet]: Secondary | ICD-10-CM | POA: Diagnosis not present

## 2022-03-18 DIAGNOSIS — N390 Urinary tract infection, site not specified: Secondary | ICD-10-CM | POA: Diagnosis not present

## 2022-03-18 DIAGNOSIS — B952 Enterococcus as the cause of diseases classified elsewhere: Secondary | ICD-10-CM | POA: Diagnosis not present

## 2022-03-18 DIAGNOSIS — R1311 Dysphagia, oral phase: Secondary | ICD-10-CM | POA: Diagnosis not present

## 2022-03-18 DIAGNOSIS — R2681 Unsteadiness on feet: Secondary | ICD-10-CM | POA: Diagnosis not present

## 2022-03-18 DIAGNOSIS — R41841 Cognitive communication deficit: Secondary | ICD-10-CM | POA: Diagnosis not present

## 2022-03-18 DIAGNOSIS — L0390 Cellulitis, unspecified: Secondary | ICD-10-CM | POA: Diagnosis not present

## 2022-03-18 DIAGNOSIS — M6281 Muscle weakness (generalized): Secondary | ICD-10-CM | POA: Diagnosis not present

## 2022-03-21 ENCOUNTER — Encounter: Payer: Self-pay | Admitting: Orthopedic Surgery

## 2022-03-21 ENCOUNTER — Non-Acute Institutional Stay (SKILLED_NURSING_FACILITY): Payer: Medicare Other | Admitting: Orthopedic Surgery

## 2022-03-21 DIAGNOSIS — R269 Unspecified abnormalities of gait and mobility: Secondary | ICD-10-CM | POA: Diagnosis not present

## 2022-03-21 DIAGNOSIS — R3121 Asymptomatic microscopic hematuria: Secondary | ICD-10-CM

## 2022-03-21 DIAGNOSIS — I5032 Chronic diastolic (congestive) heart failure: Secondary | ICD-10-CM

## 2022-03-21 DIAGNOSIS — R6889 Other general symptoms and signs: Secondary | ICD-10-CM | POA: Diagnosis not present

## 2022-03-21 DIAGNOSIS — I4819 Other persistent atrial fibrillation: Secondary | ICD-10-CM

## 2022-03-21 DIAGNOSIS — B952 Enterococcus as the cause of diseases classified elsewhere: Secondary | ICD-10-CM | POA: Diagnosis not present

## 2022-03-21 DIAGNOSIS — L982 Febrile neutrophilic dermatosis [Sweet]: Secondary | ICD-10-CM | POA: Diagnosis not present

## 2022-03-21 DIAGNOSIS — N3001 Acute cystitis with hematuria: Secondary | ICD-10-CM

## 2022-03-21 DIAGNOSIS — I10 Essential (primary) hypertension: Secondary | ICD-10-CM | POA: Diagnosis not present

## 2022-03-21 DIAGNOSIS — Z171 Estrogen receptor negative status [ER-]: Secondary | ICD-10-CM

## 2022-03-21 DIAGNOSIS — J479 Bronchiectasis, uncomplicated: Secondary | ICD-10-CM | POA: Diagnosis not present

## 2022-03-21 DIAGNOSIS — M8589 Other specified disorders of bone density and structure, multiple sites: Secondary | ICD-10-CM | POA: Diagnosis not present

## 2022-03-21 DIAGNOSIS — M8580 Other specified disorders of bone density and structure, unspecified site: Secondary | ICD-10-CM | POA: Diagnosis not present

## 2022-03-21 DIAGNOSIS — Z5181 Encounter for therapeutic drug level monitoring: Secondary | ICD-10-CM | POA: Diagnosis not present

## 2022-03-21 DIAGNOSIS — R41841 Cognitive communication deficit: Secondary | ICD-10-CM | POA: Diagnosis not present

## 2022-03-21 DIAGNOSIS — M1990 Unspecified osteoarthritis, unspecified site: Secondary | ICD-10-CM | POA: Diagnosis not present

## 2022-03-21 DIAGNOSIS — C50312 Malignant neoplasm of lower-inner quadrant of left female breast: Secondary | ICD-10-CM | POA: Diagnosis not present

## 2022-03-21 DIAGNOSIS — N390 Urinary tract infection, site not specified: Secondary | ICD-10-CM | POA: Diagnosis not present

## 2022-03-21 DIAGNOSIS — L0390 Cellulitis, unspecified: Secondary | ICD-10-CM | POA: Diagnosis not present

## 2022-03-21 LAB — BASIC METABOLIC PANEL
BUN: 19 (ref 4–21)
CO2: 28 — AB (ref 13–22)
Chloride: 101 (ref 99–108)
Creatinine: 0.6 (ref 0.5–1.1)
Glucose: 69
Potassium: 4.2 mEq/L (ref 3.5–5.1)
Sodium: 135 — AB (ref 137–147)

## 2022-03-21 LAB — CBC AND DIFFERENTIAL
HCT: 29 — AB (ref 36–46)
Hemoglobin: 9.6 — AB (ref 12.0–16.0)
Neutrophils Absolute: 9843
Platelets: 457 10*3/uL — AB (ref 150–400)
WBC: 12.8

## 2022-03-21 LAB — COMPREHENSIVE METABOLIC PANEL
Calcium: 8.1 — AB (ref 8.7–10.7)
eGFR: 90

## 2022-03-21 LAB — CBC: RBC: 3.31 — AB (ref 3.87–5.11)

## 2022-03-21 LAB — POCT ERYTHROCYTE SEDIMENTATION RATE, NON-AUTOMATED: Sed Rate: 48

## 2022-03-21 NOTE — Progress Notes (Signed)
Location:  [facility] Room Number: N15/A Place of Service:  SNF [phone]) Provider: Yvonna Alanis, NP  Patient Care Team: Lavone Orn, MD as PCP - General (Internal Medicine)  Extended Emergency Contact Information Primary Emergency Contact: Lio,louise Address: [address]          [place], [place] [address] Johnnette Litter of [place] Work Phone: [phone] Mobile Phone: [phone] Relation: Daughter Secondary Emergency Contact: [place] Mobile Phone: [phone] Relation: Daughter  Code Status:  DNR Goals of care: Advanced Directive information    03/21/2022   10:14 AM  Advanced Directives  Does Patient Have a Medical Advance Directive? Yes  Type of Paramedic of [place];Living will;Out of facility DNR (pink MOST or yellow form)  Does patient want to make changes to medical advance directive? No - Patient declined  Copy of [place] in Chart? Yes - validated most recent copy scanned in chart (See row information)  Pre-existing out of facility DNR order (yellow form or pink MOST form) Pink MOST form placed in chart (order not valid for inpatient use);Yellow form placed in chart (order not valid for inpatient use)     Chief Complaint  Patient presents with   Hospitalization Follow-up    Hospital follow up.     HPI:  Pt is a 81 y.o. female seen today for a hospital f/u s/p admission from [facility]/29-07[facility]06.   She currently resides on the skilled nursing unit at [facility]. PMH: atrial fibrillation, bronchiectasis, GERD, TMJ syndrome, hyponatremia, breast cancer (left) s/p mastectomy, glaucoma,palpable purpura, and protein calorie malnutrition.   06/28 daughters noticed she was more confused and hallucinating after cardioversion 06/27. She presented to ED for further evaluation. CT head negative for acute abnormality, WBC 20, Na 130. UA noted > 50 RBC/HPF.Urine culture positive for  Enterococcus faecalis, given amoxicillin x 7 days. CT abdomen negative for ureteral calcuil or nephrolithiasis. H/o bladder repair surgery. Patient of [facility], advised to f/u outpatient. 06/19 she was seen by dermatology for skin biopsy- single lesion to tongue and wrist. 06/28 she was noted to have to have numerous skin lesions to bilateral ankles/feet/hands, given ceftriaxone x 7 days. Etiology of lesions still unknown. RF elevated, ANA negative. H/o +ANA. ESR 53 (06/24) > 88 (07/05). She does have a family history of autoimmune disorders as well. She is planned to follow up with rheumatology 07/10. Precipitant medications Eliquis and Cardizem discontinued. Prednisone initiated 07/03. Remains on metoprolol, Digoxin and Pradaxa for afib. She was given lasix due to fluid overload. Advised daily weights and I/O. Follow up with cardiology 07/25. CXR with RUL scarring without consolidation or pulmonary edema- s/p MAI x 6 months. Discharged to [facility] skilled unit for additional PT/OT.   Today, she is alert and oriented x 4. Denies chest pain or sob. She has started working with PT/OT. Ambulating with walker, 1+ assist. No recent falls. Denies having much of a appetite. Reports one episode of diarrhea yesterday.     Past Medical History:  Diagnosis Date   Bronchiectasis ([place])    Cancer ([place])    remission- breast   Glaucoma    Past Surgical History:  Procedure Laterality Date   BLADDER REPAIR     BREAST LUMPECTOMY Left 2009   CARDIOVERSION N/A 12/03/2021   Procedure: CARDIOVERSION;  Surgeon: Skeet Latch, MD;  Location: [facility];  Service: Cardiovascular;  Laterality: N/A;   CARDIOVERSION N/A 03/08/2022   Procedure: CARDIOVERSION;  Surgeon: Lyman Bishop  C, MD;  Location: [facility];  Service: Cardiovascular;  Laterality: N/A;    Allergies  Allergen Reactions   Apraclonidine Other (See Comments)    Redness of eye   Iodine Other (See Comments)    Iodine in eye drops  - redness of eyes    Outpatient Encounter Medications as of 03/21/2022  Medication Sig   acetaminophen (TYLENOL) 325 MG tablet Take 325 mg by mouth every 6 (six) hours as needed for mild pain.   Albuterol Sulfate (PROAIR RESPICLICK) 026 (90 Base) MCG/ACT AEPB Inhale into the lungs every 6 (six) hours as needed (For wheezing/shortness of breath).   amoxicillin (AMOXIL) 500 MG capsule Take 500 mg by mouth in the morning and at bedtime. For UTI   AZOPT 1 % ophthalmic suspension Place 1 drop into the right eye 2 (two) times daily.   Cholecalciferol 50 MCG (2000 UT) CAPS Take 2,000 Units by mouth 3 (three) times a week. Mon, Wed, Fri   dabigatran (PRADAXA) 150 MG CAPS capsule Take 150 mg by mouth 2 (two) times daily. For PAF   Dextromethorphan HBr (DELSYM PO) Take 10 mLs by mouth at bedtime. Cough/Bronchiectasis   furosemide (LASIX) 20 MG tablet Take 20 mg by mouth daily as needed. For weight gain of 3 pounds in one day or 5 pounds in a week (take with Potassium)   HYDROcodone-acetaminophen (NORCO/VICODIN) 5-325 MG tablet Take 1 tablet by mouth every 6 (six) hours as needed for moderate pain.   latanoprost (XALATAN) 0.005 % ophthalmic solution Place 1 drop into the right eye at bedtime. For glaucoma   loperamide (IMODIUM) 2 MG capsule Take 4 mg by mouth 4 (four) times daily as needed for diarrhea or loose stools.   magic mouthwash w/lidocaine SOLN Take 5 mLs by mouth 3 (three) times daily before meals.   metoprolol succinate (TOPROL-XL) 50 MG 24 hr tablet Take 50 mg by mouth daily. For PAF   potassium chloride 20 MEQ/15ML (10%) SOLN Take 15 mLs (20 mEq total) by mouth daily as needed (when taking lasix).   predniSONE (DELTASONE) 50 MG tablet Take 1 tablet (50 mg total) by mouth daily.   sodium chloride HYPERTONIC 3 % nebulizer solution Take 3 mLs by nebulization in the morning. Via nebulizer   Wound Dressings (MEDIHONEY WOUND/BURN DRESSING) GEL Apply topically daily. Cleanse wounds to lower legs with  normal saline. Pat dry  then apply a nickel thick layer of MediHoney directly onto wound or onto a  dressing. Make certain that dressing covers entire wound base, but not in contact  with peri-wound skin. Next, cover with nonadherant gauze and secure with  Kerlix/tape. Change dressing daily.   [DISCONTINUED] furosemide (LASIX) 20 MG tablet Take 1 tablet (20 mg total) by mouth daily as needed for edema.   [DISCONTINUED] albuterol (VENTOLIN HFA) 108 (90 Base) MCG/ACT inhaler INHALE 2 PUFFS INTO THE LUNGS EVERY 6 HOURS AS NEEDED FOR WHEEZING/SHORNTESS OF BREATH   [DISCONTINUED] amoxicillin (AMOXIL) 500 MG capsule Take 1 capsule (500 mg total) by mouth every 12 (twelve) hours for 7 days.   [DISCONTINUED] dabigatran (PRADAXA) 150 MG CAPS capsule Take 1 capsule (150 mg total) by mouth every 12 (twelve) hours.   [DISCONTINUED] digoxin (LANOXIN) 0.125 MG tablet Take 0.5 tablets (0.0625 mg total) by mouth daily.   [DISCONTINUED] sodium chloride HYPERTONIC 3 % nebulizer solution Take by nebulization daily. (Patient taking differently: Take 4 mLs by nebulization daily as needed (shortness of breath).)   No facility-administered encounter medications on file as  of 03/21/2022.    Review of Systems  Constitutional:  Negative for activity change, appetite change, chills, fatigue and fever.  HENT:  Negative for congestion and trouble swallowing.   Eyes:  Negative for visual disturbance.  Respiratory:  Positive for cough. Negative for shortness of breath and wheezing.   Cardiovascular:  Negative for chest pain and leg swelling.  Gastrointestinal:  Positive for diarrhea. Negative for abdominal distention, abdominal pain, constipation, nausea and vomiting.  Genitourinary:  Positive for hematuria. Negative for dysuria, frequency, vaginal bleeding and vaginal discharge.  Musculoskeletal:  Positive for gait problem.  Skin:  Positive for wound.  Neurological:  Positive for weakness. Negative for dizziness and  headaches.  Psychiatric/Behavioral:  Negative for confusion, dysphoric mood and sleep disturbance. The patient is not nervous/anxious.     Immunization History  Administered Date(s) Administered   Fluad Quad(high Dose 65+) 07/08/2020, 06/27/2021   Influenza Split 07/05/2010, 06/27/2012, 07/01/2013, 06/12/2014, 06/24/2015, 07/03/2017, 05/21/2018, 06/11/2020   Influenza Whole 08/30/2007, 06/26/2017   Influenza, High Dose Seasonal PF 06/27/2017, 06/21/2021   Influenza,inj,quad, With Preservative 06/27/2017   Influenza-Unspecified 06/27/2012, 07/01/2013, 07/01/2014, 06/24/2015, 06/23/2016, 06/27/2017   PFIZER Comirnaty(Gray Top)Covid-19 Tri-Sucrose Vaccine 09/19/2019, 10/10/2019, 12/10/2020   PFIZER(Purple Top)SARS-COV-2 Vaccination 05/01/2020, 05/28/2020   Pneumococcal Conjugate-13 09/26/2014   Pneumococcal Polysaccharide-23 09/03/2007, 10/30/2020   Td 06/18/2004   Tdap 09/28/2015   Zoster, Live 04/16/2009, 11/02/2021   Pertinent  Health Maintenance Due  Topic Date Due   DEXA SCAN  Never done   INFLUENZA VACCINE  04/12/2022      03/15/2022    3:00 PM 03/15/2022   11:00 PM 03/16/2022    8:00 AM 03/16/2022    9:00 PM 03/17/2022    9:00 AM  Fall Risk  Patient Fall Risk Level High fall risk Moderate fall risk Moderate fall risk High fall risk High fall risk   Functional Status Survey:    Vitals:   03/21/22 0952  BP: 129/66  Pulse: 84  Resp: 18  Temp: (!) 97.4 F (36.3 C)  SpO2: 94%  Weight: 106 lb (48.1 kg)  Height: _0  (1.575 m)   Body mass index is 19.39 kg/m. Physical Exam Vitals reviewed.  Constitutional:      General: She is not in acute distress. HENT:     Head: Normocephalic.  Eyes:     General:        Right eye: No discharge.        Left eye: No discharge.  Cardiovascular:     Rate and Rhythm: Normal rate. Rhythm irregular.     Pulses: Normal pulses.     Heart sounds: Normal heart sounds.  Pulmonary:     Effort: Pulmonary effort is normal. No respiratory  distress.     Breath sounds: Normal breath sounds. No wheezing.  Abdominal:     General: Bowel sounds are normal. There is no distension.     Palpations: Abdomen is soft.     Tenderness: There is no abdominal tenderness.  Musculoskeletal:     Cervical back: Neck supple.     Right lower leg: No edema.     Left lower leg: No edema.  Skin:    General: Skin is warm and dry.     Capillary Refill: Capillary refill takes less than 2 seconds.     Comments: Numerous, raised, crusted lesions to wrist (R>L) and ankles, serous drainage, no sign of infection, slightly tender, surrounding skin intact.   Neurological:     General: No focal deficit present.  Mental Status: She is alert and oriented to person, place, and time.     Motor: Weakness present.     Gait: Gait abnormal.     Comments: Walker/wheelchair  Psychiatric:        Mood and Affect: Mood normal.        Behavior: Behavior normal.     Comments: Very pleasant, follows commands, AO x4     Labs reviewed: Recent Labs    03/08/22 0051 03/09/22 0130 03/11/22 0231 03/12/22 0321 03/13/22 0246 03/14/22 0335 03/15/22 0025 03/16/22 0049 03/17/22 0059  NA 134*   < > 131*   < > 134*   < > 132* 133* 132*  K 4.0   < > 4.2   < > 3.6   < > 3.9 4.0 4.1  CL 99   < > 100   < > 99   < > 100 99 100  CO2 24   < > 25   < > 22   < > _0 GLUCOSE 108*   < > 94   < > 96   < > 167* 113* 122*  BUN 15   < > 15   < > 12   < > _1 CREATININE 0.72   < > 0.74   < > 0.60   < > 0.60 0.57 0.73  CALCIUM 8.4*   < > 8.3*   < > 8.1*   < > 8.2* 8.3* 8.2*  MG 1.8  --  2.0  --  2.1  --   --   --   --    < > = values in this interval not displayed.   Recent Labs    03/10/22 1123 03/11/22 0231 03/17/22 0059  AST 25 21 36  ALT _2 ALKPHOS 109 92 79  BILITOT 1.6* 1.3* 0.5  PROT 8.3* 6.7 6.0*  ALBUMIN 3.1* 2.4* 2.0*   Recent Labs    03/05/22 1811 03/06/22 0142 03/09/22 0130 03/10/22 1123 03/12/22 0321 03/13/22 0246 03/15/22 0025  03/16/22 0049  WBC 15.2*   < > 14.1*   < > 16.4* 18.0* 11.2* 11.9*  NEUTROABS 10.7*  --  10.0*  --  12.8*  --   --   --   HGB 11.7*   < > 10.3*   < > 10.1* 9.9* 10.5* 9.9*  HCT 35.5*   < > 30.9*   < > 31.3* 29.8* 31.7* 30.3*  MCV 90.6   < > 90.9   < > 90.2 89.2 90.1 89.9  PLT 340   < > 326   < > 371 346 372 401*   < > = values in this interval not displayed.   Lab Results  Component Value Date   TSH 1.655 03/08/2022   No results found for: "HGBA1C" Lab Results  Component Value Date   CHOL 161 07/24/2007   HDL 54 07/24/2007   LDLCALC 98 07/24/2007   TRIG 43 07/24/2007   CHOLHDL 3.0 Ratio 07/24/2007    Significant Diagnostic Results in last 30 days:  DG Chest Portable 1 View  Result Date: 03/10/2022 CLINICAL DATA:  Altered mental status, history of breast cancer in bronchiectasis EXAM: PORTABLE CHEST 1 VIEW COMPARISON:  Portable exam 1303 hours compared to 03/04/2022 FINDINGS: Normal heart size, mediastinal contours, and pulmonary vascularity. Chronic bronchitic and interstitial changes similar to prior study. No definite acute infiltrate, pleural effusion, or pneumothorax. Bones demineralized. Atherosclerotic calcifications aorta. IMPRESSION: Chronic bronchitic  and interstitial changes without acute infiltrate. Aortic Atherosclerosis (ICD10-I70.0). Electronically Signed   By: Lavonia Dana M.D.   On: 03/10/2022 13:13   CT HEAD WO CONTRAST (5MM)  Result Date: 03/10/2022 CLINICAL DATA:  Mental status change, unknown cause EXAM: CT HEAD WITHOUT CONTRAST TECHNIQUE: Contiguous axial images were obtained from the base of the skull through the vertex without intravenous contrast. RADIATION DOSE REDUCTION: This exam was performed according to the departmental dose-optimization program which includes automated exposure control, adjustment of the mA and/or kV according to patient size and/or use of iterative reconstruction technique. COMPARISON:  None Available. FINDINGS: Brain: There is no acute  intracranial hemorrhage, mass effect, or edema. Gray-white differentiation is preserved. There is no extra-axial fluid collection. Prominence of the ventricles and sulci reflects mild parenchymal volume loss. Patchy and confluent hypoattenuation in the supratentorial white matter is nonspecific but may reflect mild to moderate chronic microvascular ischemic changes. Vascular: There is atherosclerotic calcification at the skull base. Skull: Calvarium is unremarkable. Sinuses/Orbits: No acute finding. Other: None. IMPRESSION: No acute intracranial abnormality. Chronic microvascular ischemic changes. Electronically Signed   By: Macy Mis M.D.   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 Cellulitis - given rocephin x 7 days - Eliquis and Cardizem discontinued- ? Cause - elevated RF and ESR, ANA negative (h/o being elevated in past) - rheumatology consult 07/10 - cont to cover lesions with medihoney and non adherent dressing daily  2. Persistent atrial fibrillation ([place]) - followed by cardiology- f/u 07/25 - cardioversion 03/08/2022 - HR controlled with metoprolol and digoxin - cont Pradaxa for clot prevention  3. Acute cystitis with hematuria - Urine culture positive for Enterococcus faecalis - cont amoxicillin x 7 days  4. Asymptomatic microscopic hematuria -  UA > 50 RBC/HPF  - CT abdomen negative for urethral stones or nephrolithiasis  5. Chronic heart failure with preserved ejection fraction (H[facility]) -  LVEF 50-55% 03/05/2022 - given lasix d/t fluid overload - off lasix at this time - cont daily weights, I/O  6. Bronchiectasis without complication ([place]) - 66/06 CXR showed RUL scarring without consolidation or edema - cont delsym and albuterol nebs prn  7. Malignant neoplasm of lower-inner quadrant of  left breast in female, estrogen receptor negative ([place]) - s/p XRT  8. Abnormal gait - cont PT/OT    Family/ staff Communication: plan discussed with patient and nurse  Labs/tests ordered:  cbc/diff, bmp, magnesium, ESR, digoxin level 03/21/2022

## 2022-03-22 DIAGNOSIS — R41841 Cognitive communication deficit: Secondary | ICD-10-CM | POA: Diagnosis not present

## 2022-03-22 DIAGNOSIS — L982 Febrile neutrophilic dermatosis [Sweet]: Secondary | ICD-10-CM | POA: Diagnosis not present

## 2022-03-22 DIAGNOSIS — L0390 Cellulitis, unspecified: Secondary | ICD-10-CM | POA: Diagnosis not present

## 2022-03-22 DIAGNOSIS — J479 Bronchiectasis, uncomplicated: Secondary | ICD-10-CM | POA: Diagnosis not present

## 2022-03-22 DIAGNOSIS — B952 Enterococcus as the cause of diseases classified elsewhere: Secondary | ICD-10-CM | POA: Diagnosis not present

## 2022-03-22 DIAGNOSIS — N390 Urinary tract infection, site not specified: Secondary | ICD-10-CM | POA: Diagnosis not present

## 2022-03-23 DIAGNOSIS — L982 Febrile neutrophilic dermatosis [Sweet]: Secondary | ICD-10-CM | POA: Diagnosis not present

## 2022-03-23 DIAGNOSIS — R41841 Cognitive communication deficit: Secondary | ICD-10-CM | POA: Diagnosis not present

## 2022-03-23 DIAGNOSIS — B952 Enterococcus as the cause of diseases classified elsewhere: Secondary | ICD-10-CM | POA: Diagnosis not present

## 2022-03-23 DIAGNOSIS — L0390 Cellulitis, unspecified: Secondary | ICD-10-CM | POA: Diagnosis not present

## 2022-03-23 DIAGNOSIS — N390 Urinary tract infection, site not specified: Secondary | ICD-10-CM | POA: Diagnosis not present

## 2022-03-23 DIAGNOSIS — J479 Bronchiectasis, uncomplicated: Secondary | ICD-10-CM | POA: Diagnosis not present

## 2022-03-24 ENCOUNTER — Encounter: Payer: Self-pay | Admitting: Internal Medicine

## 2022-03-24 ENCOUNTER — Non-Acute Institutional Stay (SKILLED_NURSING_FACILITY): Payer: Medicare Other | Admitting: Internal Medicine

## 2022-03-24 ENCOUNTER — Telehealth (HOSPITAL_BASED_OUTPATIENT_CLINIC_OR_DEPARTMENT_OTHER): Payer: Self-pay

## 2022-03-24 ENCOUNTER — Other Ambulatory Visit (HOSPITAL_BASED_OUTPATIENT_CLINIC_OR_DEPARTMENT_OTHER): Payer: Self-pay

## 2022-03-24 DIAGNOSIS — R3121 Asymptomatic microscopic hematuria: Secondary | ICD-10-CM | POA: Diagnosis not present

## 2022-03-24 DIAGNOSIS — I4819 Other persistent atrial fibrillation: Secondary | ICD-10-CM

## 2022-03-24 DIAGNOSIS — K219 Gastro-esophageal reflux disease without esophagitis: Secondary | ICD-10-CM

## 2022-03-24 DIAGNOSIS — R41841 Cognitive communication deficit: Secondary | ICD-10-CM | POA: Diagnosis not present

## 2022-03-24 DIAGNOSIS — B952 Enterococcus as the cause of diseases classified elsewhere: Secondary | ICD-10-CM | POA: Diagnosis not present

## 2022-03-24 DIAGNOSIS — L982 Febrile neutrophilic dermatosis [Sweet]: Secondary | ICD-10-CM

## 2022-03-24 DIAGNOSIS — J479 Bronchiectasis, uncomplicated: Secondary | ICD-10-CM | POA: Diagnosis not present

## 2022-03-24 DIAGNOSIS — L0390 Cellulitis, unspecified: Secondary | ICD-10-CM | POA: Diagnosis not present

## 2022-03-24 DIAGNOSIS — N390 Urinary tract infection, site not specified: Secondary | ICD-10-CM | POA: Diagnosis not present

## 2022-03-24 LAB — MAGNESIUM: Magnesium: 2

## 2022-03-24 NOTE — Progress Notes (Signed)
Provider:  Veleta Miners MD  Location:   [facility] Room Number: 15 Place of Service:  SNF (31)  PCP: Virgie Dad, MD Patient Care Team: Virgie Dad, MD as PCP - General (Internal Medicine)  Extended Emergency Contact Information Primary Emergency Contact: Mainer,louise Address: [place], [place] [address] Johnnette Litter of [place] Work Phone: [phone] Mobile Phone: [phone] Relation: Daughter Secondary Emergency Contact: [place] Mobile Phone: [phone] Relation: Daughter  Code Status: DNR Goals of Care: Advanced Directive information    03/24/2022   11:59 AM  Advanced Directives  Does Patient Have a Medical Advance Directive? Yes  Type of Paramedic of [place];Living will;Out of facility DNR (pink MOST or yellow form)  Does patient want to make changes to medical advance directive? No - Patient declined  Copy of [place] in Chart? Yes - validated most recent copy scanned in chart (See row information)  Pre-existing out of facility DNR order (yellow form or pink MOST form) Pink MOST form placed in chart (order not valid for inpatient use);Yellow form placed in chart (order not valid for inpatient use)      Chief Complaint  Patient presents with   New Admit To SNF    Admission to SNF    HPI: Patient is a 81 y.o. female seen today for admission to SNF   Patient has h/o bronchiectasis paces with MAC. Remote history of breast cancer s/p chemo and radiation therapy  Diagnosed with A-fib was started on Eliquis and diltiazem. Cardioversion on 03/24 and 06/27  Admitted in the hospital from 06/29-07/06 with Change in mental status Was found to have E Faecalis UTI Treated with Antibiotics  At the same  time before her admission patient had developed Rash in her ankle and Lower Leg. Biopsy by Dr Magda Kiel and Diagnosed with Neutrophilic  Dermatitis Possible due to Eliquis Dr Dossie Der suggested to start her on Prednisone and Eliquis is changed to Pradaxa Since then she is better no New lesions Her old lesions are also healing Not Painful Just stings Was c/o Some Gerd like Symptoms No other issues  Walking with her walker Still weak Also Has Cough worsening as cant use her Chest vet Denies fever or Chest pain or SOB   Past Medical History:  Diagnosis Date   Bronchiectasis ([place])    Cancer ([place])    remission- breast   Glaucoma    Past Surgical History:  Procedure Laterality Date   BLADDER REPAIR     BREAST LUMPECTOMY Left 2009   CARDIOVERSION N/A 12/03/2021   Procedure: CARDIOVERSION;  Surgeon: Skeet Latch, MD;  Location: [facility];  Service: Cardiovascular;  Laterality: N/A;   CARDIOVERSION N/A 03/08/2022   Procedure: CARDIOVERSION;  Surgeon: Pixie Casino, MD;  Location: [facility];  Service: Cardiovascular;  Laterality: N/A;    reports that she quit smoking about 60 years ago. Her smoking use included cigarettes. She has a 0.75 pack-year smoking history. She has never used smokeless tobacco. She reports current alcohol use. She reports that she does not use drugs. Social History   Socioeconomic History   Marital status: Married    Spouse name: Not on file   Number of children: Not on file   Years of education: Not on file   Highest education level: Not on file  Occupational History   Not on file  Tobacco Use   Smoking status: Former  Packs/day: 0.25    Years: 3.00    Total pack years: 0.75    Types: Cigarettes    Quit date: 09/12/1961    Years since quitting: 60.5   Smokeless tobacco: Never  Vaping Use   Vaping Use: Never used  Substance and Sexual Activity   Alcohol use: Yes   Drug use: No   Sexual activity: Not on file  Other Topics Concern   Not on file  Social History Narrative   Lives in independent living at [facility].   Social Determinants of Health   Financial Resource  Strain: Not on file  Food Insecurity: Not on file  Transportation Needs: Not on file  Physical Activity: Not on file  Stress: Not on file  Social Connections: Not on file  Intimate Partner Violence: Not on file    Functional Status Survey:    History reviewed. No pertinent family history.  Health Maintenance  Topic Date Due   Zoster Vaccines- Shingrix (1 of 2) Never done   DEXA SCAN  Never done   COVID-19 Vaccine (6 - Booster for Pfizer series) 02/04/2021   INFLUENZA VACCINE  04/12/2022   TETANUS/TDAP  09/27/2025   Pneumonia Vaccine 15+ Years old  Completed   HPV VACCINES  Aged Out    Allergies  Allergen Reactions   Apraclonidine Other (See Comments)    Redness of eye   Iodine Other (See Comments)    Iodine in eye drops - redness of eyes    Allergies as of 03/24/2022       Reactions   Apraclonidine Other (See Comments)   Redness of eye   Iodine Other (See Comments)   Iodine in eye drops - redness of eyes        Medication List        Accurate as of March 24, 2022 11:59 AM. If you have any questions, ask your nurse or doctor.          STOP taking these medications    loperamide 2 MG capsule Commonly known as: IMODIUM Stopped by: Virgie Dad, MD       TAKE these medications    acetaminophen 325 MG tablet Commonly known as: TYLENOL Take 650 mg by mouth every 6 (six) hours as needed for mild pain.   Albuterol Sulfate 108 (90 Base) MCG/ACT Aepb Commonly known as: PROAIR RESPICLICK Inhale into the lungs every 6 (six) hours as needed (For wheezing/shortness of breath).   amoxicillin 500 MG capsule Commonly known as: AMOXIL Take 500 mg by mouth in the morning and at bedtime. For UTI   Azopt 1 % ophthalmic suspension Generic drug: brinzolamide Place 1 drop into the right eye 2 (two) times daily.   Cholecalciferol 50 MCG (2000 UT) Caps Take 2,000 Units by mouth 3 (three) times a week. Mon, Wed, Fri   DELSYM PO Take 10 mLs by mouth at  bedtime. Cough/Bronchiectasis   digoxin 0.125 MG tablet Commonly known as: LANOXIN Take 0.0625 mg by mouth daily.   furosemide 20 MG tablet Commonly known as: LASIX Take 20 mg by mouth daily as needed. For weight gain of 3 pounds in one day or 5 pounds in a week (take with Potassium)   HYDROcodone-acetaminophen 5-325 MG tablet Commonly known as: NORCO/VICODIN Take 1 tablet by mouth every 6 (six) hours as needed for moderate pain.   latanoprost 0.005 % ophthalmic solution Commonly known as: XALATAN Place 1 drop into the right eye at bedtime. For glaucoma   magic  mouthwash w/lidocaine Soln Take 5 mLs by mouth 3 (three) times daily before meals.   Medihoney Wound/Burn Dressing Gel Apply topically daily. Cleanse wounds to lower legs with normal saline. Pat dry  then apply a nickel thick layer of MediHoney directly onto wound or onto a  dressing. Make certain that dressing covers entire wound base, but not in contact  with peri-wound skin. Next, cover with nonadherant gauze and secure with  Kerlix/tape. Change dressing daily.   metoprolol succinate 50 MG 24 hr tablet Commonly known as: TOPROL-XL Take 50 mg by mouth daily. For PAF   nystatin 100000 UNIT/ML suspension Commonly known as: MYCOSTATIN Take 5 mLs by mouth 3 (three) times daily.   pantoprazole 20 MG tablet Commonly known as: PROTONIX Take 20 mg by mouth daily.   potassium chloride 20 MEQ/15ML (10%) Soln Take 15 mLs (20 mEq total) by mouth daily as needed (when taking lasix).   Pradaxa 150 MG Caps capsule Generic drug: dabigatran Take 150 mg by mouth 2 (two) times daily. For PAF   predniSONE 10 MG tablet Commonly known as: DELTASONE Take 30 mg by mouth daily with breakfast. What changed: Another medication with the same name was removed. Continue taking this medication, and follow the directions you see here. Changed by: Virgie Dad, MD   sodium chloride HYPERTONIC 3 % nebulizer solution Take 3 mLs by  nebulization in the morning. Via nebulizer        Review of Systems  Constitutional:  Positive for activity change. Negative for appetite change.  HENT: Negative.    Respiratory:  Positive for cough. Negative for shortness of breath.   Cardiovascular:  Negative for leg swelling.  Gastrointestinal:  Negative for constipation.  Genitourinary: Negative.   Musculoskeletal:  Positive for gait problem. Negative for arthralgias and myalgias.  Skin:  Positive for wound.  Neurological:  Positive for weakness. Negative for dizziness.  Psychiatric/Behavioral:  Negative for confusion, dysphoric mood and sleep disturbance.     Vitals:   03/24/22 1136  BP: (!) 153/77  Pulse: 74  Resp: 20  Temp: 98.7 F (37.1 C)  SpO2: 94%  Weight: 102 lb (46.3 kg)  Height: _0  (1.575 m)   Body mass index is 18.66 kg/m. Physical Exam Vitals reviewed.  Constitutional:      Appearance: Normal appearance.  HENT:     Head: Normocephalic.     Nose: Nose normal.     Mouth/Throat:     Mouth: Mucous membranes are moist.     Pharynx: Oropharynx is clear.  Eyes:     Pupils: Pupils are equal, round, and reactive to light.  Cardiovascular:     Rate and Rhythm: Normal rate and regular rhythm.     Pulses: Normal pulses.     Heart sounds: Normal heart sounds. No murmur heard. Pulmonary:     Effort: Pulmonary effort is normal.     Breath sounds: Normal breath sounds.  Abdominal:     General: Abdomen is flat. Bowel sounds are normal.     Palpations: Abdomen is soft.  Musculoskeletal:        General: No swelling.     Cervical back: Neck supple.  Skin:    General: Skin is warm.     Comments: Crusty Vesicular lesions which are dry and Healing in her Lower legs Bilateral   Neurological:     General: No focal deficit present.     Mental Status: She is alert and oriented to person, place, and time.  Psychiatric:        Mood and Affect: Mood normal.        Thought Content: Thought content normal.      Labs reviewed: Basic Metabolic Panel: Recent Labs    03/11/22 0231 03/12/22 0321 03/13/22 0246 03/14/22 0335 03/15/22 0025 03/16/22 0049 03/17/22 0059 03/21/22 0000  NA 131*   < > 134*   < > 132* 133* 132* 135*  K 4.2   < > 3.6   < > 3.9 4.0 4.1 4.2  CL 100   < > 99   < > 100 99 100 101  CO2 25   < > 22   < > _0 28*  GLUCOSE 94   < > 96   < > 167* 113* 122*  --   BUN 15   < > 12   < > _1 CREATININE 0.74   < > 0.60   < > 0.60 0.57 0.73 0.6  CALCIUM 8.3*   < > 8.1*   < > 8.2* 8.3* 8.2* 8.1*  MG 2.0  --  2.1  --   --   --   --  2.0   < > = values in this interval not displayed.   Liver Function Tests: Recent Labs    03/10/22 1123 03/11/22 0231 03/17/22 0059  AST 25 21 36  ALT _2 ALKPHOS 109 92 79  BILITOT 1.6* 1.3* 0.5  PROT 8.3* 6.7 6.0*  ALBUMIN 3.1* 2.4* 2.0*   No results for input(s): "LIPASE", "AMYLASE" in the last 8760 hours. No results for input(s): "AMMONIA" in the last 8760 hours. CBC: Recent Labs    03/09/22 0130 03/10/22 1123 03/12/22 0321 03/13/22 0246 03/15/22 0025 03/16/22 0049 03/21/22 0000  WBC 14.1*   < > 16.4* 18.0* 11.2* 11.9* 12.8  NEUTROABS 10.0*  --  12.8*  --   --   --  9,843.00  HGB 10.3*   < > 10.1* 9.9* 10.5* 9.9* 9.6*  HCT 30.9*   < > 31.3* 29.8* 31.7* 30.3* 29*  MCV 90.9   < > 90.2 89.2 90.1 89.9  --   PLT 326   < > 371 346 372 401* 457*   < > = values in this interval not displayed.   Cardiac Enzymes: No results for input(s): "CKTOTAL", "CKMB", "CKMBINDEX", "TROPONINI" in the last 8760 hours. BNP: Invalid input(s): "POCBNP" No results found for: "HGBA1C" Lab Results  Component Value Date   TSH 1.655 03/08/2022   No results found for: "VITAMINB12" No results found for: "FOLATE" No results found for: "IRON", "TIBC", "FERRITIN"  Imaging and Procedures obtained prior to SNF admission: DG Chest Portable 1 View  Result Date: 03/10/2022 CLINICAL DATA:  Altered mental status, history of breast  cancer in bronchiectasis EXAM: PORTABLE CHEST 1 VIEW COMPARISON:  Portable exam 1303 hours compared to 03/04/2022 FINDINGS: Normal heart size, mediastinal contours, and pulmonary vascularity. Chronic bronchitic and interstitial changes similar to prior study. No definite acute infiltrate, pleural effusion, or pneumothorax. Bones demineralized. Atherosclerotic calcifications aorta. IMPRESSION: Chronic bronchitic and interstitial changes without acute infiltrate. Aortic Atherosclerosis (ICD10-I70.0). Electronically Signed   By: Lavonia Dana M.D.   On: 03/10/2022 13:13   CT HEAD WO CONTRAST (5MM)  Result Date: 03/10/2022 CLINICAL DATA:  Mental status change, unknown cause EXAM: CT HEAD WITHOUT CONTRAST TECHNIQUE: Contiguous axial images were obtained from the base of the skull through the vertex without intravenous contrast. RADIATION DOSE REDUCTION: This  exam was performed according to the departmental dose-optimization program which includes automated exposure control, adjustment of the mA and/or kV according to patient size and/or use of iterative reconstruction technique. COMPARISON:  None Available. FINDINGS: Brain: There is no acute intracranial hemorrhage, mass effect, or edema. Gray-white differentiation is preserved. There is no extra-axial fluid collection. Prominence of the ventricles and sulci reflects mild parenchymal volume loss. Patchy and confluent hypoattenuation in the supratentorial white matter is nonspecific but may reflect mild to moderate chronic microvascular ischemic changes. Vascular: There is atherosclerotic calcification at the skull base. Skull: Calvarium is unremarkable. Sinuses/Orbits: No acute finding. Other: None. IMPRESSION: No acute intracranial abnormality. Chronic microvascular ischemic changes. Electronically Signed   By: Macy Mis M.D.   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 Etiology thought to be due to  Eliquis On Pradax now Has appointment with Dr Dossie Der Medihoney dressing  2. Persistent atrial fibrillation (HCC) On Pradaxa and Digoxin and Metoprolol Not on Cardizem and Eliquis due to Rash   3. Asymptomatic microscopic hematuria Has appointment with Urology CT  scan of abdomen  was negative  4. Bronchiectasis without complication ([place]) Restart her chest Vest Already on Saline Nebs  5. Gastroesophageal reflux disease, unspecified whether esophagitis present Change Protonix to BID as c/o Heart burn 6 h/o CHF Now on Lasix Prn for now 8 UTI Treated with Amoxicilin   Family/ staff Communication:   Labs/tests ordered: Repeat Labs Pending

## 2022-03-24 NOTE — Telephone Encounter (Signed)
Pharmacy Transitions of Care Follow-up Telephone Call  Date of discharge: 03/09/22 & 7/6  Discharge Diagnosis: AFib  How have you been since you were released from the hospital? Patient has been doing well since discharged. She was admitted after original discharge  for treatment of UTI. Currently taking her Pradaxa. No questions at this time.  Medication changes made at discharge: Start: Pradaxa 17m BID  CONTINUE taking these medications  CONTINUE taking these medications  acetaminophen 325 MG tablet Commonly known as: TYLENOL  Azopt 1 % ophthalmic suspension Generic drug: brinzolamide  Cholecalciferol 50 MCG (2000 UT) Caps  DELSYM PO  latanoprost 0.005 % ophthalmic solution Commonly known as: XALATAN  metoprolol succinate 50 MG 24 hr tablet Commonly known as: TOPROL-XL   STOP taking these medications  STOP taking these medications  Eliquis 2.5 MG Tabs tablet Generic drug: apixaban    Medication Accessibility:  Home Pharmacy: [facility]   Medication Review:  DABIGATRAN (PRADAXA) Rivaroxaban 150 mg BID initiated on 6/25.    - Discussed importance of taking medication with food and around the same time everyday  - Reviewed potential DDIs with patient  - Advised patient of medications to avoid (NSAIDs, ASA)  - Educated that Tylenol (acetaminophen) will be the preferred analgesic to prevent risk of bleeding  - Emphasized importance of monitoring for signs and symptoms of bleeding (abnormal bruising, prolonged bleeding, nose bleeds, bleeding from gums, discolored urine, black tarry stools)  - Advised patient to alert all providers of anticoagulation therapy prior to starting a new medication or having a procedure    Follow-up Appointments: 04/05/2022  8:20 AM OFFICE VISIT 20 min [facility] [[[id number]]   Final Patient Assessment: Patient has been doing well since discharged. She was admitted after original discharge  for treatment of UTI. Currently  taking her Pradaxa. No questions at this time.  ALind Guest PharmD Clinical Pharmacy Resident  [facility]7/13/2023 9:58 AM

## 2022-03-25 DIAGNOSIS — L0390 Cellulitis, unspecified: Secondary | ICD-10-CM | POA: Diagnosis not present

## 2022-03-25 DIAGNOSIS — L982 Febrile neutrophilic dermatosis [Sweet]: Secondary | ICD-10-CM | POA: Diagnosis not present

## 2022-03-25 DIAGNOSIS — N390 Urinary tract infection, site not specified: Secondary | ICD-10-CM | POA: Diagnosis not present

## 2022-03-25 DIAGNOSIS — R41841 Cognitive communication deficit: Secondary | ICD-10-CM | POA: Diagnosis not present

## 2022-03-25 DIAGNOSIS — J479 Bronchiectasis, uncomplicated: Secondary | ICD-10-CM | POA: Diagnosis not present

## 2022-03-25 DIAGNOSIS — B952 Enterococcus as the cause of diseases classified elsewhere: Secondary | ICD-10-CM | POA: Diagnosis not present

## 2022-03-28 DIAGNOSIS — J479 Bronchiectasis, uncomplicated: Secondary | ICD-10-CM | POA: Diagnosis not present

## 2022-03-28 DIAGNOSIS — R41841 Cognitive communication deficit: Secondary | ICD-10-CM | POA: Diagnosis not present

## 2022-03-28 DIAGNOSIS — L0390 Cellulitis, unspecified: Secondary | ICD-10-CM | POA: Diagnosis not present

## 2022-03-28 DIAGNOSIS — B952 Enterococcus as the cause of diseases classified elsewhere: Secondary | ICD-10-CM | POA: Diagnosis not present

## 2022-03-28 DIAGNOSIS — L982 Febrile neutrophilic dermatosis [Sweet]: Secondary | ICD-10-CM | POA: Diagnosis not present

## 2022-03-28 DIAGNOSIS — N390 Urinary tract infection, site not specified: Secondary | ICD-10-CM | POA: Diagnosis not present

## 2022-03-29 DIAGNOSIS — L982 Febrile neutrophilic dermatosis [Sweet]: Secondary | ICD-10-CM | POA: Diagnosis not present

## 2022-03-29 DIAGNOSIS — N390 Urinary tract infection, site not specified: Secondary | ICD-10-CM | POA: Diagnosis not present

## 2022-03-29 DIAGNOSIS — J479 Bronchiectasis, uncomplicated: Secondary | ICD-10-CM | POA: Diagnosis not present

## 2022-03-29 DIAGNOSIS — L0390 Cellulitis, unspecified: Secondary | ICD-10-CM | POA: Diagnosis not present

## 2022-03-29 DIAGNOSIS — B952 Enterococcus as the cause of diseases classified elsewhere: Secondary | ICD-10-CM | POA: Diagnosis not present

## 2022-03-29 DIAGNOSIS — R41841 Cognitive communication deficit: Secondary | ICD-10-CM | POA: Diagnosis not present

## 2022-03-30 ENCOUNTER — Encounter: Payer: Self-pay | Admitting: Orthopedic Surgery

## 2022-03-30 DIAGNOSIS — B952 Enterococcus as the cause of diseases classified elsewhere: Secondary | ICD-10-CM | POA: Diagnosis not present

## 2022-03-30 DIAGNOSIS — J479 Bronchiectasis, uncomplicated: Secondary | ICD-10-CM | POA: Diagnosis not present

## 2022-03-30 DIAGNOSIS — N3000 Acute cystitis without hematuria: Secondary | ICD-10-CM | POA: Diagnosis not present

## 2022-03-30 DIAGNOSIS — L0390 Cellulitis, unspecified: Secondary | ICD-10-CM | POA: Diagnosis not present

## 2022-03-30 DIAGNOSIS — R3121 Asymptomatic microscopic hematuria: Secondary | ICD-10-CM | POA: Diagnosis not present

## 2022-03-30 DIAGNOSIS — R41841 Cognitive communication deficit: Secondary | ICD-10-CM | POA: Diagnosis not present

## 2022-03-30 DIAGNOSIS — L982 Febrile neutrophilic dermatosis [Sweet]: Secondary | ICD-10-CM | POA: Diagnosis not present

## 2022-03-30 DIAGNOSIS — N390 Urinary tract infection, site not specified: Secondary | ICD-10-CM | POA: Diagnosis not present

## 2022-03-31 DIAGNOSIS — L0390 Cellulitis, unspecified: Secondary | ICD-10-CM | POA: Diagnosis not present

## 2022-03-31 DIAGNOSIS — R41841 Cognitive communication deficit: Secondary | ICD-10-CM | POA: Diagnosis not present

## 2022-03-31 DIAGNOSIS — B952 Enterococcus as the cause of diseases classified elsewhere: Secondary | ICD-10-CM | POA: Diagnosis not present

## 2022-03-31 DIAGNOSIS — L982 Febrile neutrophilic dermatosis [Sweet]: Secondary | ICD-10-CM | POA: Diagnosis not present

## 2022-03-31 DIAGNOSIS — J479 Bronchiectasis, uncomplicated: Secondary | ICD-10-CM | POA: Diagnosis not present

## 2022-03-31 DIAGNOSIS — N390 Urinary tract infection, site not specified: Secondary | ICD-10-CM | POA: Diagnosis not present

## 2022-04-01 ENCOUNTER — Encounter: Payer: Self-pay | Admitting: Orthopedic Surgery

## 2022-04-01 ENCOUNTER — Non-Acute Institutional Stay (SKILLED_NURSING_FACILITY): Payer: Medicare Other | Admitting: Orthopedic Surgery

## 2022-04-01 DIAGNOSIS — K219 Gastro-esophageal reflux disease without esophagitis: Secondary | ICD-10-CM | POA: Diagnosis not present

## 2022-04-01 DIAGNOSIS — R3121 Asymptomatic microscopic hematuria: Secondary | ICD-10-CM | POA: Diagnosis not present

## 2022-04-01 DIAGNOSIS — R630 Anorexia: Secondary | ICD-10-CM

## 2022-04-01 DIAGNOSIS — J479 Bronchiectasis, uncomplicated: Secondary | ICD-10-CM | POA: Diagnosis not present

## 2022-04-01 DIAGNOSIS — L0390 Cellulitis, unspecified: Secondary | ICD-10-CM | POA: Diagnosis not present

## 2022-04-01 DIAGNOSIS — B952 Enterococcus as the cause of diseases classified elsewhere: Secondary | ICD-10-CM | POA: Diagnosis not present

## 2022-04-01 DIAGNOSIS — I4819 Other persistent atrial fibrillation: Secondary | ICD-10-CM

## 2022-04-01 DIAGNOSIS — L982 Febrile neutrophilic dermatosis [Sweet]: Secondary | ICD-10-CM

## 2022-04-01 DIAGNOSIS — R059 Cough, unspecified: Secondary | ICD-10-CM | POA: Diagnosis not present

## 2022-04-01 DIAGNOSIS — R41841 Cognitive communication deficit: Secondary | ICD-10-CM | POA: Diagnosis not present

## 2022-04-01 DIAGNOSIS — D649 Anemia, unspecified: Secondary | ICD-10-CM | POA: Diagnosis not present

## 2022-04-01 DIAGNOSIS — R058 Other specified cough: Secondary | ICD-10-CM

## 2022-04-01 DIAGNOSIS — N390 Urinary tract infection, site not specified: Secondary | ICD-10-CM | POA: Diagnosis not present

## 2022-04-01 NOTE — Progress Notes (Signed)
Location:   [facility] Room Number: 15 Place of Service:  SNF ([phone]) Provider:  Windell Moulding, NP   Virgie Dad, MD  Patient Care Team: Virgie Dad, MD as PCP - General (Internal Medicine)  Extended Emergency Contact Information Primary Emergency Contact: Porcaro,louise Address: [address]          [place], [place] [address] Johnnette Litter of [place] Work Phone: [phone] Mobile Phone: [phone] Relation: Daughter Secondary Emergency Contact: Gisela Mobile Phone: [phone] Relation: Daughter  Code Status:  DNR Goals of care: Advanced Directive information    04/01/2022   10:14 AM  Advanced Directives  Does Patient Have a Medical Advance Directive? Yes  Type of Paramedic of [place];Living will;Out of facility DNR (pink MOST or yellow form)  Does patient want to make changes to medical advance directive? No - Patient declined  Copy of [place] in Chart? Yes - validated most recent copy scanned in chart (See row information)  Pre-existing out of facility DNR order (yellow form or pink MOST form) Pink MOST form placed in chart (order not valid for inpatient use);Yellow form placed in chart (order not valid for inpatient use)     Chief Complaint  Patient presents with   Acute Visit    Cough    HPI:  Pt is a 81 y.o. female seen today for an acute visit for productive cough.   Productive cough x 2 days. Recently hospitalized [facility] 06/29-07/06 due to  Neutrophilic dermatosis. She was also given amoxicillin x 7 days for UTI (Enterococcus faecalis). H/o bronchiectasis- chest vest and saline nebs restarted. 06/23 CXR revealed RUL scarring, no consolidation or edema. She remains on delsym and albuterol prn. She reports increased/sudden fatigue today. Denies chest pain, sob or cold symptoms. Afebrile. Vitals stable.   She has not been eating well since admission. Admits feeling hungry, but  only eats 3 bites of food. She denies depression. Admits seeing her health and husbands health decline is difficult. Diet changes to smaller portions.   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 Lesions to wrists and ankles improved. Followed by Rheumatology. Remains on prednisone. Daily dressing changes with medihoney.   Afib- HR controlled with digoxin and metoprolol, remains on Pradaxa for clot prevention     Past Medical History:  Diagnosis Date   Bronchiectasis ([place])    Cancer ([place])    remission- breast   Glaucoma    Past Surgical History:  Procedure Laterality Date   BLADDER REPAIR     BREAST LUMPECTOMY Left 2009   CARDIOVERSION N/A 12/03/2021   Procedure: CARDIOVERSION;  Surgeon: Skeet Latch, MD;  Location: [facility];  Service: Cardiovascular;  Laterality: N/A;   CARDIOVERSION N/A 03/08/2022   Procedure: CARDIOVERSION;  Surgeon: Pixie Casino, MD;  Location: [facility] ENDOSCOPY;  Service: Cardiovascular;  Laterality: N/A;    Allergies  Allergen Reactions   Apraclonidine Other (See Comments)    Redness of eye   Iodine Other (See Comments)    Iodine in eye drops - redness of eyes    Allergies as of 04/01/2022       Reactions   Apraclonidine Other (See Comments)   Redness of eye   Iodine Other (See Comments)   Iodine in eye drops - redness of eyes        Medication List        Accurate as of April 01, 2022 10:14 AM. If you have any questions, ask  your nurse or doctor.          STOP taking these medications    amoxicillin 500 MG capsule Commonly known as: AMOXIL Stopped by: Yvonna Alanis, NP       TAKE these medications    acetaminophen 325 MG tablet Commonly known as: TYLENOL Take 650 mg by mouth every 6 (six) hours as needed for mild pain.   Albuterol Sulfate 108 (90 Base) MCG/ACT Aepb Commonly known as: PROAIR RESPICLICK Inhale into the lungs every 6 (six) hours as needed (For wheezing/shortness of breath).   Azopt 1  % ophthalmic suspension Generic drug: brinzolamide Place 1 drop into the right eye 2 (two) times daily.   Cholecalciferol 50 MCG (2000 UT) Caps Take 2,000 Units by mouth 3 (three) times a week. Mon, Wed, Fri   DELSYM PO Take 10 mLs by mouth at bedtime. Cough/Bronchiectasis   digoxin 0.125 MG tablet Commonly known as: LANOXIN Take 0.0625 mg by mouth daily.   furosemide 20 MG tablet Commonly known as: LASIX Take 20 mg by mouth daily as needed. For weight gain of 3 pounds in one day or 5 pounds in a week (take with Potassium)   HYDROcodone-acetaminophen 5-325 MG tablet Commonly known as: NORCO/VICODIN Take 1 tablet by mouth every 6 (six) hours as needed for moderate pain.   latanoprost 0.005 % ophthalmic solution Commonly known as: XALATAN Place 1 drop into the right eye at bedtime. For glaucoma   magic mouthwash w/lidocaine Soln Take 5 mLs by mouth 3 (three) times daily before meals.   Medihoney Wound/Burn Dressing Gel Apply topically daily. Cleanse wounds to lower legs with normal saline. Pat dry  then apply a nickel thick layer of MediHoney directly onto wound or onto a  dressing. Make certain that dressing covers entire wound base, but not in contact  with peri-wound skin. Next, cover with nonadherant gauze and secure with  Kerlix/tape. Change dressing daily.   metoprolol succinate 50 MG 24 hr tablet Commonly known as: TOPROL-XL Take 50 mg by mouth daily. For PAF   nystatin 100000 UNIT/ML suspension Commonly known as: MYCOSTATIN Take 5 mLs by mouth 3 (three) times daily.   pantoprazole 20 MG tablet Commonly known as: PROTONIX Take 40 mg by mouth 2 (two) times daily.   potassium chloride 20 MEQ/15ML (10%) Soln Take 15 mLs (20 mEq total) by mouth daily as needed (when taking lasix).   Pradaxa 150 MG Caps capsule Generic drug: dabigatran Take 150 mg by mouth 2 (two) times daily. For PAF   predniSONE 10 MG tablet Commonly known as: DELTASONE Take 20 mg by mouth  daily.   predniSONE 10 MG tablet Commonly known as: DELTASONE Take 10 mg by mouth daily with breakfast. Start taking on: April 05, 2022   predniSONE 10 MG tablet Commonly known as: DELTASONE Take 5 mg by mouth daily with breakfast. 1/2 tab (5 mg) daily until next appt with rheumatology Start taking on: April 12, 2022   sodium chloride HYPERTONIC 3 % nebulizer solution Take 3 mLs by nebulization in the morning. Via nebulizer        Review of Systems  Constitutional:  Negative for activity change, appetite change, chills, fatigue and fever.  HENT:  Negative for congestion and trouble swallowing.   Eyes:  Negative for visual disturbance.  Respiratory:  Positive for cough and wheezing. Negative for shortness of breath.   Cardiovascular:  Negative for chest pain and leg swelling.  Gastrointestinal:  Negative for abdominal distention, abdominal pain,  constipation, diarrhea, nausea and vomiting.  Genitourinary:  Negative for dysuria, frequency and hematuria.  Musculoskeletal:  Positive for arthralgias and gait problem.  Skin:  Positive for wound.  Neurological:  Positive for weakness. Negative for dizziness and headaches.  Psychiatric/Behavioral:  Positive for dysphoric mood. Negative for confusion and sleep disturbance. The patient is not nervous/anxious.     Immunization History  Administered Date(s) Administered   Fluad Quad(high Dose 65+) 07/08/2020, 06/27/2021   Influenza Split 07/05/2010, 06/27/2012, 07/01/2013, 06/12/2014, 06/24/2015, 07/03/2017, 05/21/2018, 06/11/2020   Influenza Whole 08/30/2007, 06/26/2017   Influenza, High Dose Seasonal PF 06/27/2017, 06/21/2021   Influenza,inj,quad, With Preservative 06/27/2017   Influenza-Unspecified 06/27/2012, 07/01/2013, 07/01/2014, 06/24/2015, 06/23/2016, 06/27/2017   PFIZER Comirnaty(Gray Top)Covid-19 Tri-Sucrose Vaccine 09/19/2019, 10/10/2019, 12/10/2020   PFIZER(Purple Top)SARS-COV-2 Vaccination 05/01/2020, 05/28/2020    Pneumococcal Conjugate-13 09/26/2014   Pneumococcal Polysaccharide-23 09/03/2007, 10/30/2020   Td 06/18/2004   Tdap 09/28/2015   Zoster, Live 04/16/2009, 11/02/2021   Pertinent  Health Maintenance Due  Topic Date Due   DEXA SCAN  Never done   INFLUENZA VACCINE  04/12/2022      03/15/2022    3:00 PM 03/15/2022   11:00 PM 03/16/2022    8:00 AM 03/16/2022    9:00 PM 03/17/2022    9:00 AM  Fall Risk  Patient Fall Risk Level High fall risk Moderate fall risk Moderate fall risk High fall risk High fall risk   Functional Status Survey:    Vitals:   04/01/22 0942  BP: 130/69  Pulse: 89  Resp: 18  Temp: 97.9 F (36.6 C)  SpO2: 94%  Weight: 95 lb (43.1 kg)  Height: _0  (1.575 m)   Body mass index is 17.38 kg/m. Physical Exam Vitals reviewed.  Constitutional:      General: She is not in acute distress. HENT:     Head: Normocephalic.  Eyes:     General:        Right eye: No discharge.        Left eye: No discharge.  Cardiovascular:     Rate and Rhythm: Normal rate. Rhythm irregular.     Pulses: Normal pulses.     Heart sounds: Normal heart sounds.  Pulmonary:     Effort: Pulmonary effort is normal. No respiratory distress.     Breath sounds: Examination of the right-middle field reveals decreased breath sounds. Examination of the left-middle field reveals decreased breath sounds. Decreased breath sounds present. No wheezing.  Abdominal:     General: Abdomen is flat. Bowel sounds are normal. There is no distension.     Palpations: Abdomen is soft.     Tenderness: There is no abdominal tenderness.  Musculoskeletal:     Cervical back: Neck supple.     Right lower leg: No edema.     Left lower leg: No edema.  Skin:    General: Skin is warm and dry.     Capillary Refill: Capillary refill takes less than 2 seconds.     Findings: Lesion and rash present.     Comments: Scattered lesions to wrists and ankles, CDI, no sign of infection, lesions closed with scabs  Neurological:      General: No focal deficit present.     Mental Status: She is alert and oriented to person, place, and time.     Motor: Weakness present.     Gait: Gait abnormal.     Comments: walker  Psychiatric:        Mood and Affect: Mood normal.  Behavior: Behavior normal.     Labs reviewed: Recent Labs    03/11/22 0231 03/12/22 0321 03/13/22 0246 03/14/22 0335 03/15/22 0025 03/16/22 0049 03/17/22 0059 03/21/22 0000  NA 131*   < > 134*   < > 132* 133* 132* 135*  K 4.2   < > 3.6   < > 3.9 4.0 4.1 4.2  CL 100   < > 99   < > 100 99 100 101  CO2 25   < > 22   < > _0 28*  GLUCOSE 94   < > 96   < > 167* 113* 122*  --   BUN 15   < > 12   < > _1 CREATININE 0.74   < > 0.60   < > 0.60 0.57 0.73 0.6  CALCIUM 8.3*   < > 8.1*   < > 8.2* 8.3* 8.2* 8.1*  MG 2.0  --  2.1  --   --   --   --  2.0   < > = values in this interval not displayed.   Recent Labs    03/10/22 1123 03/11/22 0231 03/17/22 0059  AST 25 21 36  ALT _2 ALKPHOS 109 92 79  BILITOT 1.6* 1.3* 0.5  PROT 8.3* 6.7 6.0*  ALBUMIN 3.1* 2.4* 2.0*   Recent Labs    03/09/22 0130 03/10/22 1123 03/12/22 0321 03/13/22 0246 03/15/22 0025 03/16/22 0049 03/21/22 0000  WBC 14.1*   < > 16.4* 18.0* 11.2* 11.9* 12.8  NEUTROABS 10.0*  --  12.8*  --   --   --  9,843.00  HGB 10.3*   < > 10.1* 9.9* 10.5* 9.9* 9.6*  HCT 30.9*   < > 31.3* 29.8* 31.7* 30.3* 29*  MCV 90.9   < > 90.2 89.2 90.1 89.9  --   PLT 326   < > 371 346 372 401* 457*   < > = values in this interval not displayed.   Lab Results  Component Value Date   TSH 1.655 03/08/2022   No results found for: "HGBA1C" Lab Results  Component Value Date   CHOL 161 07/24/2007   HDL 54 07/24/2007   LDLCALC 98 07/24/2007   TRIG 43 07/24/2007   CHOLHDL 3.0 Ratio 07/24/2007    Significant Diagnostic Results in last 30 days:  DG Chest Portable 1 View  Result Date: 03/10/2022 CLINICAL DATA:  Altered mental status, history of breast cancer in  bronchiectasis EXAM: PORTABLE CHEST 1 VIEW COMPARISON:  Portable exam 1303 hours compared to 03/04/2022 FINDINGS: Normal heart size, mediastinal contours, and pulmonary vascularity. Chronic bronchitic and interstitial changes similar to prior study. No definite acute infiltrate, pleural effusion, or pneumothorax. Bones demineralized. Atherosclerotic calcifications aorta. IMPRESSION: Chronic bronchitic and interstitial changes without acute infiltrate. Aortic Atherosclerosis (ICD10-I70.0). Electronically Signed   By: Lavonia Dana M.D.   On: 03/10/2022 13:13   CT HEAD WO CONTRAST (5MM)  Result Date: 03/10/2022 CLINICAL DATA:  Mental status change, unknown cause EXAM: CT HEAD WITHOUT CONTRAST TECHNIQUE: Contiguous axial images were obtained from the base of the skull through the vertex without intravenous contrast. RADIATION DOSE REDUCTION: This exam was performed according to the departmental dose-optimization program which includes automated exposure control, adjustment of the mA and/or kV according to patient size and/or use of iterative reconstruction technique. COMPARISON:  None Available. FINDINGS: Brain: There is no acute intracranial hemorrhage, mass effect, or edema. Gray-white differentiation is preserved. There is no  extra-axial fluid collection. Prominence of the ventricles and sulci reflects mild parenchymal volume loss. Patchy and confluent hypoattenuation in the supratentorial white matter is nonspecific but may reflect mild to moderate chronic microvascular ischemic changes. Vascular: There is atherosclerotic calcification at the skull base. Skull: Calvarium is unremarkable. Sinuses/Orbits: No acute finding. Other: None. IMPRESSION: No acute intracranial abnormality. Chronic microvascular ischemic changes. Electronically Signed   By: Macy Mis M.D.   On: 03/10/2022 12:35   CT ABDOMEN PELVIS WO CONTRAST  Result Date: 03/08/2022 CLINICAL DATA:  An 81 year old female presents for evaluation of  the abdomen in the setting of microscopic hematuria. EXAM: CT ABDOMEN AND PELVIS WITHOUT CONTRAST TECHNIQUE: Multidetector CT imaging of the abdomen and pelvis was performed following the standard protocol without IV contrast. RADIATION DOSE REDUCTION: This exam was performed according to the departmental dose-optimization program which includes automated exposure control, adjustment of the mA and/or kV according to patient size and/or use of iterative reconstruction technique. COMPARISON:  CT of the chest from Feb 04, 2021. FINDINGS: Lower chest: Areas of mucoid impaction in branching opacities at the RIGHT and LEFT lung base. No pleural effusion. No dense consolidative changes. Appearance is improved based on comparison with imaging from Feb 04, 2021. Hepatobiliary: Smooth hepatic contours. No pericholecystic stranding. Cholelithiasis with moderately large gallstones up to 13 mm greatest dimension throughout the dependent gallbladder. Pancreas: Normal contour without signs of adjacent inflammation. Spleen: Normal. Adrenals/Urinary Tract: Adrenal glands are normal. Mild perinephric stranding perhaps slightly worse on the RIGHT than the LEFT. No hydronephrosis. No visible ureteral calculi though lack of retroperitoneal and intra-abdominal fat limits assessment. No perivesical stranding. Stomach/Bowel: No gross perigastric stranding. No signs of small bowel obstruction. No signs of small bowel inflammation. Appendix not visible though there are no signs to suggest acute appendiceal process in the RIGHT lower quadrant. Colon is under distended limiting assessment. Vascular/Lymphatic: Aortic atherosclerosis. No sign of aneurysm. Smooth contour of the IVC. There is no gastrohepatic or hepatoduodenal ligament lymphadenopathy. No retroperitoneal or mesenteric lymphadenopathy. No pelvic sidewall lymphadenopathy. Limited assessment due to lack of intravenous contrast. Reproductive: Unremarkable by CT. Other: No ascites.  No  pneumoperitoneum. Musculoskeletal: No acute bone finding. No destructive bone process. Spinal degenerative changes. IMPRESSION: 1. Mild perinephric stranding perhaps slightly worse on the RIGHT than the LEFT. No visible ureteral calculi though lack of retroperitoneal and intra-abdominal fat limits assessment. Correlate with urinalysis to exclude urinary tract infection/RIGHT-sided pyelonephritis. Would also correlate with urinalysis and with symptoms. 2. No signs of nephrolithiasis or ureteral calculi. 3. Improving appearance of areas of mucoid impaction in multi nodularity at the lung bases likely related to chronic infection, potentially MAI. 4. Cholelithiasis. 5. Aortic atherosclerosis. Aortic Atherosclerosis (ICD10-I70.0). Electronically Signed   By: Zetta Bills M.D.   On: 03/08/2022 12:43   ECHOCARDIOGRAM LIMITED  Result Date: 03/05/2022    ECHOCARDIOGRAM LIMITED REPORT   Patient Name:   CLEONA DOUBLEDAY Date of Exam: 03/05/2022 Medical Rec #:  [id number]        Height:       62.0 in Accession #:    [id number]       Weight:       112.9 lb Date of Birth:  [birth year]         BSA:          1.499 m Patient Age:    49 years         BP:           100/68 mmHg Patient  Gender: F                HR:           110 bpm. Exam Location:  Inpatient Procedure: Limited Echo and Limited Color Doppler Indications:    V25.36 Acute diastolic (congestive) heart failure  History:        Patient has prior history of Echocardiogram examinations, most                 recent 11/10/2021. Arrythmias:Atrial Fibrillation; Risk                 Factors:Former Smoker. History of left breast cancer.  Sonographer:    Wilkie Aye RVT Referring Phys: [id number] Darreld Mclean  Sonographer Comments: Technically difficult study due to poor echo windows. IMPRESSIONS  1. Left ventricular ejection fraction, by estimation, is 50 to 55%. The left ventricle has low normal function.  2. RV / RA gradient estimaged to be 31 mmHg. Dilated IVC - RA pressure  estimated at 15 mm Hg.     Estimaged PA pressure of 46 mmHg. Marland Kitchen There is moderately elevated pulmonary artery systolic pressure.  3. Left atrial size was mildly dilated.  4. Moderate to severe mitral valve regurgitation.  5. Tricuspid valve regurgitation is moderate.  6. The [place] is heavily calcified . The aortic valve is tricuspid. There is mild calcification of the aortic valve. Comparison(s): 11/10/21-EF 60-65%. FINDINGS  Left Ventricle: Left ventricular ejection fraction, by estimation, is 50 to 55%. The left ventricle has low normal function. Right Ventricle: RV / RA gradient estimaged to be 31 mmHg. Dilated IVC - RA pressure estimated at 15 mm Hg. Estimaged PA pressure of 46 mmHg. There is moderately elevated pulmonary artery systolic pressure. Left Atrium: Left atrial size was mildly dilated. Right Atrium: Right atrial size was normal in size. Mitral Valve: Moderate to severe mitral valve regurgitation. Tricuspid Valve: Tricuspid valve regurgitation is moderate. Aortic Valve: The [place] is heavily calcified. The aortic valve is tricuspid. There is mild calcification of the aortic valve. Aorta: The aortic root and ascending aorta are structurally normal, with no evidence of dilitation. IAS/Shunts: The atrial septum is grossly normal. LEFT VENTRICLE PLAX 2D LVIDd:         3.90 cm LVIDs:         2.80 cm LV PW:         0.80 cm LV IVS:        0.75 cm LVOT diam:     1.90 cm LVOT Area:     2.84 cm  LV Volumes (MOD) LV vol d, MOD A4C: 58.6 ml LV vol s, MOD A4C: 30.9 ml LV SV MOD A4C:     58.6 ml IVC IVC diam: 2.30 cm LEFT ATRIUM           Index        RIGHT ATRIUM           Index LA diam:      4.00 cm 2.67 cm/m   RA Area:     15.70 cm LA Vol (A2C): 25.8 ml 17.21 ml/m  RA Volume:   43.20 ml  28.81 ml/m LA Vol (A4C): 55.6 ml 37.09 ml/m   AORTA Ao Root diam: 2.80 cm Ao Asc diam:  3.30 cm  SHUNTS Systemic Diam: 1.90 cm Mertie Moores MD Electronically signed by Mertie Moores MD Signature Date/Time: 03/05/2022/1:21:45 PM     Final    DG CHEST PORT 1 VIEW  Result Date:  03/04/2022 CLINICAL DATA:  Difficulty breathing EXAM: PORTABLE CHEST 1 VIEW COMPARISON:  01/12/2022 FINDINGS: Transverse diameter of heart is slightly increased. There are no signs of alveolar pulmonary edema or focal pulmonary consolidation. Small linear densities in the lateral aspect of right upper lung fields may suggest scarring. There is interval decrease in interstitial markings in both lungs. There is blunting of left lateral CP angle. There is no pneumothorax. IMPRESSION: There are no signs of alveolar pulmonary edema or new focal infiltrates. Possible small left pleural effusion. Electronically Signed   By: Elmer Picker M.D.   On: 03/04/2022 16:05    Assessment/Plan 1. Productive cough - onset 2 days ago - diminished bases noted, reports sudden fatigue/weakness - recent hospitalization- ? PNA - H/o bronchiectasis - stat CXR- suggested interstitial pneumonitis - stat cbc/diff, cmp-WBC 20.3, neutrophilis 18331 04/01/2022 - cont prednisone   2. Poor appetite - eating 3 bites then full - admits to feeling hungry - ? Depression - consider starting Remeron - albumin 6.0 04/01/2022  3. Neutrophilic dermatosis - followed by Dr. Dossie Der - lesions appear to be healing - thought to be related to Eliquis - now on Pradaxa - cont prednisone- recently tapered  - cont medihonery dressing changes  4. Persistent atrial fibrillation (HCC) - HR controlled with digoxin and metoprolol - cont Pradaxa  5. Bronchiectasis without complication (HCC) - Chest vest and saline nebs restarted- ? Recent increase in cough - see above - cont Delsym and albuterol  6. Gastroesophageal reflux disease, unspecified whether esophagitis present - hgb stable - cont Protonix  7. Asymptomatic microscopic hematuria - followed by urology    Family/ staff Communication: plan discussed with patient, daughter and nurse  Labs/tests ordered:   stat cbc/diff,  cmp and CXR

## 2022-04-04 ENCOUNTER — Encounter: Payer: Self-pay | Admitting: Orthopedic Surgery

## 2022-04-04 ENCOUNTER — Non-Acute Institutional Stay (SKILLED_NURSING_FACILITY): Payer: Medicare Other | Admitting: Orthopedic Surgery

## 2022-04-04 DIAGNOSIS — L0390 Cellulitis, unspecified: Secondary | ICD-10-CM | POA: Diagnosis not present

## 2022-04-04 DIAGNOSIS — R627 Adult failure to thrive: Secondary | ICD-10-CM

## 2022-04-04 DIAGNOSIS — L982 Febrile neutrophilic dermatosis [Sweet]: Secondary | ICD-10-CM

## 2022-04-04 DIAGNOSIS — L89152 Pressure ulcer of sacral region, stage 2: Secondary | ICD-10-CM | POA: Diagnosis not present

## 2022-04-04 DIAGNOSIS — L308 Other specified dermatitis: Secondary | ICD-10-CM | POA: Diagnosis not present

## 2022-04-04 DIAGNOSIS — K219 Gastro-esophageal reflux disease without esophagitis: Secondary | ICD-10-CM | POA: Diagnosis not present

## 2022-04-04 DIAGNOSIS — J479 Bronchiectasis, uncomplicated: Secondary | ICD-10-CM | POA: Diagnosis not present

## 2022-04-04 DIAGNOSIS — B952 Enterococcus as the cause of diseases classified elsewhere: Secondary | ICD-10-CM | POA: Diagnosis not present

## 2022-04-04 DIAGNOSIS — I4891 Unspecified atrial fibrillation: Secondary | ICD-10-CM | POA: Diagnosis not present

## 2022-04-04 DIAGNOSIS — Z7401 Bed confinement status: Secondary | ICD-10-CM | POA: Diagnosis not present

## 2022-04-04 DIAGNOSIS — N390 Urinary tract infection, site not specified: Secondary | ICD-10-CM | POA: Diagnosis not present

## 2022-04-04 DIAGNOSIS — R41841 Cognitive communication deficit: Secondary | ICD-10-CM | POA: Diagnosis not present

## 2022-04-04 DIAGNOSIS — R634 Abnormal weight loss: Secondary | ICD-10-CM | POA: Diagnosis not present

## 2022-04-04 DIAGNOSIS — H409 Unspecified glaucoma: Secondary | ICD-10-CM | POA: Diagnosis not present

## 2022-04-04 DIAGNOSIS — I4819 Other persistent atrial fibrillation: Secondary | ICD-10-CM | POA: Diagnosis not present

## 2022-04-04 DIAGNOSIS — B379 Candidiasis, unspecified: Secondary | ICD-10-CM | POA: Diagnosis not present

## 2022-04-04 MED ORDER — LORAZEPAM 0.5 MG PO TABS: 0.5000 mg | ORAL_TABLET | Freq: Four times a day (QID) | ORAL | 1 refills | Status: DC | PRN

## 2022-04-04 NOTE — Progress Notes (Addendum)
Location:  [facility] Room Number: NO/15/A Place of Service:  SNF [phone]) Provider:  Windell Moulding, NP  Virgie Dad, MD  Patient Care Team: Virgie Dad, MD as PCP - General (Internal Medicine)  Extended Emergency Contact Information Primary Emergency Contact: Fadness,louise Address: [address]          [place], [place] [address] Johnnette Litter of [place] Work Phone: [phone] Mobile Phone: [phone] Relation: Daughter Secondary Emergency Contact: [place] Mobile Phone: [phone] Relation: Daughter  Code Status:  DNR Goals of care: Advanced Directive information    04/04/2022   10:35 AM  Advanced Directives  Does Patient Have a Medical Advance Directive? Yes  Type of Paramedic of [place];Living will;Out of facility DNR (pink MOST or yellow form)  Does patient want to make changes to medical advance directive? No - Patient declined  Copy of [place] in Chart? Yes - validated most recent copy scanned in chart (See row information)  Pre-existing out of facility DNR order (yellow form or pink MOST form) Pink MOST form placed in chart (order not valid for inpatient use);Yellow form placed in chart (order not valid for inpatient use)     Chief Complaint  Patient presents with   Acute Visit    Patient is being seen for poor appetite    HPI:  Pt is a 81 y.o. female seen today for an acute visit for poor appetite, refusing medication.   She currently resides on the skilled nursing unit at [facility] due to recent hospitalization [facility] 06/29-07/06- mental status change/ a-fib/UTI/ neutrophilic dermatitis/ bronchiectasis. PMH: atrial fibrillation, bronchiectasis, GERD, TMJ syndrome, hyponatremia, breast cancer (left) s/p mastectomy, glaucoma,palpable purpura, and protein calorie malnutrition.   Evaluated 07/21 due to poor appetite and productive cough. She denied depression at the time.  Diet changed to smaller portions. 04/01/2022 CXR indicated interstitial pneumonitis. WBC 20.3, neutrophils 18331 04/01/2022. Over the weekend she started refusing all her medications. She was visited by her daughter, who is a physician, and discussed starting hospice. I discussed hospice with her today. She states" I do not want to feel better for a few days, to then feel worse after." She denies depression today. She does admit to seeing her husbands health fail upsetting. She stated " going to all the specialists is exhausting, I just want to be made comfortable." Daughter confirmed her wishes via telephone. Denies pain and sob. Admits to feeling weaker. Ambulating small distances with walker. No recent falls.   Recent weights:  07/22- 95 lbs  07/21- 92 lbs  07/11- 102 lbs  07/06- 109 lbs   Past Medical History:  Diagnosis Date   Bronchiectasis ([place])    Cancer ([place])    remission- breast   Glaucoma    Past Surgical History:  Procedure Laterality Date   BLADDER REPAIR     BREAST LUMPECTOMY Left 2009   CARDIOVERSION N/A 12/03/2021   Procedure: CARDIOVERSION;  Surgeon: Skeet Latch, MD;  Location: [place];  Service: Cardiovascular;  Laterality: N/A;   CARDIOVERSION N/A 03/08/2022   Procedure: CARDIOVERSION;  Surgeon: Pixie Casino, MD;  Location: [facility];  Service: Cardiovascular;  Laterality: N/A;    Allergies  Allergen Reactions   Apraclonidine Other (See Comments)    Redness of eye   Iodine Other (See Comments)    Iodine in eye drops - redness of eyes    Outpatient Encounter Medications as of 04/04/2022  Medication Sig   acetaminophen (TYLENOL) 325  MG tablet Take 650 mg by mouth every 6 (six) hours as needed for mild pain.   Albuterol Sulfate (PROAIR RESPICLICK) 165 (90 Base) MCG/ACT AEPB Inhale into the lungs every 6 (six) hours as needed (For wheezing/shortness of breath).   AZOPT 1 % ophthalmic suspension Place 1 drop into the right eye 2 (two) times daily.    Cholecalciferol 50 MCG (2000 UT) CAPS Take 2,000 Units by mouth 3 (three) times a week. Mon, Wed, Fri   dabigatran (PRADAXA) 150 MG CAPS capsule Take 150 mg by mouth 2 (two) times daily. For PAF   Dextromethorphan HBr (DELSYM PO) Take 10 mLs by mouth at bedtime. Cough/Bronchiectasis   digoxin (LANOXIN) 0.125 MG tablet Take 0.0625 mg by mouth daily.   furosemide (LASIX) 20 MG tablet Take 20 mg by mouth daily as needed. For weight gain of 3 pounds in one day or 5 pounds in a week (take with Potassium)   HYDROcodone-acetaminophen (NORCO/VICODIN) 5-325 MG tablet Take 1 tablet by mouth every 6 (six) hours as needed for moderate pain.   latanoprost (XALATAN) 0.005 % ophthalmic solution Place 1 drop into the right eye at bedtime. For glaucoma   magic mouthwash w/lidocaine SOLN Take 5 mLs by mouth 3 (three) times daily before meals.   metoprolol succinate (TOPROL-XL) 50 MG 24 hr tablet Take 50 mg by mouth daily. For PAF   pantoprazole (PROTONIX) 20 MG tablet Take 40 mg by mouth 2 (two) times daily.   potassium chloride 20 MEQ/15ML (10%) SOLN Take 15 mLs (20 mEq total) by mouth daily as needed (when taking lasix).   [START ON 04/05/2022] predniSONE (DELTASONE) 10 MG tablet Take 10 mg by mouth daily with breakfast.   predniSONE (DELTASONE) 10 MG tablet Take 20 mg by mouth daily.   [START ON 04/12/2022] predniSONE (DELTASONE) 10 MG tablet Take 5 mg by mouth daily with breakfast. 1/2 tab (5 mg) daily until next appt with rheumatology   sodium chloride HYPERTONIC 3 % nebulizer solution Take 3 mLs by nebulization in the morning. Via nebulizer   Wound Dressings (MEDIHONEY WOUND/BURN DRESSING) GEL Apply topically daily. Cleanse wounds to lower legs with normal saline. Pat dry  then apply a nickel thick layer of MediHoney directly onto wound or onto a  dressing. Make certain that dressing covers entire wound base, but not in contact  with peri-wound skin. Next, cover with nonadherant gauze and secure with   Kerlix/tape. Change dressing daily.   No facility-administered encounter medications on file as of 04/04/2022.    Review of Systems  Constitutional:  Negative for activity change, appetite change, chills, fatigue and fever.  HENT:  Negative for congestion and trouble swallowing.   Eyes:  Negative for visual disturbance.  Respiratory:  Positive for cough. Negative for shortness of breath and wheezing.   Cardiovascular:  Negative for chest pain and leg swelling.  Gastrointestinal:  Negative for abdominal distention, abdominal pain, constipation, diarrhea, nausea and vomiting.  Genitourinary:  Negative for dysuria, frequency and hematuria.  Musculoskeletal:  Positive for gait problem.  Skin:  Positive for rash and wound.  Neurological:  Positive for weakness. Negative for dizziness and headaches.  Psychiatric/Behavioral:  Positive for dysphoric mood. Negative for confusion. The patient is not nervous/anxious.     Immunization History  Administered Date(s) Administered   Fluad Quad(high Dose 65+) 07/08/2020, 06/27/2021   Influenza Split 07/05/2010, 06/27/2012, 07/01/2013, 06/12/2014, 06/24/2015, 07/03/2017, 05/21/2018, 06/11/2020   Influenza Whole 08/30/2007, 06/26/2017   Influenza, High Dose Seasonal PF 06/27/2017, 06/21/2021  Influenza,inj,quad, With Preservative 06/27/2017   Influenza-Unspecified 06/27/2012, 07/01/2013, 07/01/2014, 06/24/2015, 06/23/2016, 06/27/2017   PFIZER Comirnaty(Gray Top)Covid-19 Tri-Sucrose Vaccine 09/19/2019, 10/10/2019, 12/10/2020   PFIZER(Purple Top)SARS-COV-2 Vaccination 05/01/2020, 05/28/2020   Pneumococcal Conjugate-13 09/26/2014   Pneumococcal Polysaccharide-23 09/03/2007, 10/30/2020   Td 06/18/2004   Tdap 09/28/2015   Zoster, Live 04/16/2009, 11/02/2021   Pertinent  Health Maintenance Due  Topic Date Due   DEXA SCAN  Never done   INFLUENZA VACCINE  04/12/2022      03/15/2022    3:00 PM 03/15/2022   11:00 PM 03/16/2022    8:00 AM 03/16/2022    9:00  PM 03/17/2022    9:00 AM  Fall Risk  Patient Fall Risk Level High fall risk Moderate fall risk Moderate fall risk High fall risk High fall risk   Functional Status Survey:    Vitals:   04/04/22 1038  BP: 122/71  Pulse: 83  Resp: 18  Temp: 97.9 F (36.6 C)  SpO2: 94%  Weight: 95 lb (43.1 kg)  Height: _0  (1.575 m)   Body mass index is 17.38 kg/m. Physical Exam Vitals reviewed.  Constitutional:      General: She is not in acute distress.    Appearance: She is cachectic.  HENT:     Head: Normocephalic.  Eyes:     General:        Right eye: No discharge.        Left eye: No discharge.  Cardiovascular:     Rate and Rhythm: Normal rate. Rhythm irregular.     Pulses: Normal pulses.     Heart sounds: Normal heart sounds.  Pulmonary:     Effort: Pulmonary effort is normal. No respiratory distress.     Breath sounds: Examination of the right-upper field reveals rhonchi. Examination of the left-upper field reveals rhonchi. Rhonchi present. No wheezing.  Abdominal:     General: Bowel sounds are normal. There is no distension.     Palpations: Abdomen is soft.     Tenderness: There is no abdominal tenderness.  Musculoskeletal:     Cervical back: Neck supple.     Right lower leg: No edema.     Left lower leg: No edema.  Skin:    General: Skin is warm and dry.     Capillary Refill: Capillary refill takes less than 2 seconds.     Comments: Scattered lesions to wrists and ankles, CDI, no sign of infection, lesions closed with scabs   Neurological:     General: No focal deficit present.     Mental Status: She is alert and oriented to person, place, and time.     Motor: Weakness present.     Gait: Gait abnormal.     Comments: walker  Psychiatric:        Mood and Affect: Mood normal.        Behavior: Behavior normal.     Comments: Very pleasant, answers appropriately     Labs reviewed: Recent Labs    03/11/22 0231 03/12/22 0321 03/13/22 0246 03/14/22 0335  03/15/22 0025 03/16/22 0049 03/17/22 0059 03/21/22 0000  NA 131*   < > 134*   < > 132* 133* 132* 135*  K 4.2   < > 3.6   < > 3.9 4.0 4.1 4.2  CL 100   < > 99   < > 100 99 100 101  CO2 25   < > 22   < > _1 28*  GLUCOSE 94   < >  96   < > 167* 113* 122*  --   BUN 15   < > 12   < > _0 CREATININE 0.74   < > 0.60   < > 0.60 0.57 0.73 0.6  CALCIUM 8.3*   < > 8.1*   < > 8.2* 8.3* 8.2* 8.1*  MG 2.0  --  2.1  --   --   --   --  2.0   < > = values in this interval not displayed.   Recent Labs    03/10/22 1123 03/11/22 0231 03/17/22 0059  AST 25 21 36  ALT _1 ALKPHOS 109 92 79  BILITOT 1.6* 1.3* 0.5  PROT 8.3* 6.7 6.0*  ALBUMIN 3.1* 2.4* 2.0*   Recent Labs    03/09/22 0130 03/10/22 1123 03/12/22 0321 03/13/22 0246 03/15/22 0025 03/16/22 0049 03/21/22 0000  WBC 14.1*   < > 16.4* 18.0* 11.2* 11.9* 12.8  NEUTROABS 10.0*  --  12.8*  --   --   --  9,843.00  HGB 10.3*   < > 10.1* 9.9* 10.5* 9.9* 9.6*  HCT 30.9*   < > 31.3* 29.8* 31.7* 30.3* 29*  MCV 90.9   < > 90.2 89.2 90.1 89.9  --   PLT 326   < > 371 346 372 401* 457*   < > = values in this interval not displayed.   Lab Results  Component Value Date   TSH 1.655 03/08/2022   No results found for: "HGBA1C" Lab Results  Component Value Date   CHOL 161 07/24/2007   HDL 54 07/24/2007   LDLCALC 98 07/24/2007   TRIG 43 07/24/2007   CHOLHDL 3.0 Ratio 07/24/2007    Significant Diagnostic Results in last 30 days:  DG Chest Portable 1 View  Result Date: 03/10/2022 CLINICAL DATA:  Altered mental status, history of breast cancer in bronchiectasis EXAM: PORTABLE CHEST 1 VIEW COMPARISON:  Portable exam 1303 hours compared to 03/04/2022 FINDINGS: Normal heart size, mediastinal contours, and pulmonary vascularity. Chronic bronchitic and interstitial changes similar to prior study. No definite acute infiltrate, pleural effusion, or pneumothorax. Bones demineralized. Atherosclerotic calcifications aorta. IMPRESSION:  Chronic bronchitic and interstitial changes without acute infiltrate. Aortic Atherosclerosis (ICD10-I70.0). Electronically Signed   By: Lavonia Dana M.D.   On: 03/10/2022 13:13   CT HEAD WO CONTRAST (5MM)  Result Date: 03/10/2022 CLINICAL DATA:  Mental status change, unknown cause EXAM: CT HEAD WITHOUT CONTRAST TECHNIQUE: Contiguous axial images were obtained from the base of the skull through the vertex without intravenous contrast. RADIATION DOSE REDUCTION: This exam was performed according to the departmental dose-optimization program which includes automated exposure control, adjustment of the mA and/or kV according to patient size and/or use of iterative reconstruction technique. COMPARISON:  None Available. FINDINGS: Brain: There is no acute intracranial hemorrhage, mass effect, or edema. Gray-white differentiation is preserved. There is no extra-axial fluid collection. Prominence of the ventricles and sulci reflects mild parenchymal volume loss. Patchy and confluent hypoattenuation in the supratentorial white matter is nonspecific but may reflect mild to moderate chronic microvascular ischemic changes. Vascular: There is atherosclerotic calcification at the skull base. Skull: Calvarium is unremarkable. Sinuses/Orbits: No acute finding. Other: None. IMPRESSION: No acute intracranial abnormality. Chronic microvascular ischemic changes. Electronically Signed   By: Macy Mis M.D.   On: 03/10/2022 12:35   CT ABDOMEN PELVIS WO CONTRAST  Result Date: 03/08/2022 CLINICAL DATA:  An 81 year old female presents for evaluation of the abdomen in  the setting of microscopic hematuria. EXAM: CT ABDOMEN AND PELVIS WITHOUT CONTRAST TECHNIQUE: Multidetector CT imaging of the abdomen and pelvis was performed following the standard protocol without IV contrast. RADIATION DOSE REDUCTION: This exam was performed according to the departmental dose-optimization program which includes automated exposure control, adjustment  of the mA and/or kV according to patient size and/or use of iterative reconstruction technique. COMPARISON:  CT of the chest from Feb 04, 2021. FINDINGS: Lower chest: Areas of mucoid impaction in branching opacities at the RIGHT and LEFT lung base. No pleural effusion. No dense consolidative changes. Appearance is improved based on comparison with imaging from Feb 04, 2021. Hepatobiliary: Smooth hepatic contours. No pericholecystic stranding. Cholelithiasis with moderately large gallstones up to 13 mm greatest dimension throughout the dependent gallbladder. Pancreas: Normal contour without signs of adjacent inflammation. Spleen: Normal. Adrenals/Urinary Tract: Adrenal glands are normal. Mild perinephric stranding perhaps slightly worse on the RIGHT than the LEFT. No hydronephrosis. No visible ureteral calculi though lack of retroperitoneal and intra-abdominal fat limits assessment. No perivesical stranding. Stomach/Bowel: No gross perigastric stranding. No signs of small bowel obstruction. No signs of small bowel inflammation. Appendix not visible though there are no signs to suggest acute appendiceal process in the RIGHT lower quadrant. Colon is under distended limiting assessment. Vascular/Lymphatic: Aortic atherosclerosis. No sign of aneurysm. Smooth contour of the IVC. There is no gastrohepatic or hepatoduodenal ligament lymphadenopathy. No retroperitoneal or mesenteric lymphadenopathy. No pelvic sidewall lymphadenopathy. Limited assessment due to lack of intravenous contrast. Reproductive: Unremarkable by CT. Other: No ascites.  No pneumoperitoneum. Musculoskeletal: No acute bone finding. No destructive bone process. Spinal degenerative changes. IMPRESSION: 1. Mild perinephric stranding perhaps slightly worse on the RIGHT than the LEFT. No visible ureteral calculi though lack of retroperitoneal and intra-abdominal fat limits assessment. Correlate with urinalysis to exclude urinary tract infection/RIGHT-sided  pyelonephritis. Would also correlate with urinalysis and with symptoms. 2. No signs of nephrolithiasis or ureteral calculi. 3. Improving appearance of areas of mucoid impaction in multi nodularity at the lung bases likely related to chronic infection, potentially MAI. 4. Cholelithiasis. 5. Aortic atherosclerosis. Aortic Atherosclerosis (ICD10-I70.0). Electronically Signed   By: Zetta Bills M.D.   On: 03/08/2022 12:43    Assessment/Plan 1. Adult failure to thrive - progressive weight loss and poor appetite x 2 weeks - refusing medications - refuses depression - BMI 17.38, mild cachexia noted - requesting hospice- daughter agreeable - hospice consult - start ativan 0.5 mg po Q6prn for anxiety - cont norco prn for pain - cancel future appointments per patient   2. 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 Persistent atrial fibrillation (HCC) - HR controlled with digoxin and metoprolol - cont Pradaxa for clot prevention  4. Bronchiectasis without complication ([place]) - 73/71/0626 CXR indicated interstitial pneumonitis - rhonchi noted today - vitals stable - refusing chest vest and saline nebs - cont Delsym and albuterol    Family/ staff Communication: hospice consult  Labs/tests ordered:  none

## 2022-04-05 ENCOUNTER — Ambulatory Visit: Payer: Medicare Other | Admitting: Internal Medicine

## 2022-04-05 DIAGNOSIS — B379 Candidiasis, unspecified: Secondary | ICD-10-CM | POA: Diagnosis not present

## 2022-04-05 DIAGNOSIS — R634 Abnormal weight loss: Secondary | ICD-10-CM | POA: Diagnosis not present

## 2022-04-05 DIAGNOSIS — J479 Bronchiectasis, uncomplicated: Secondary | ICD-10-CM | POA: Diagnosis not present

## 2022-04-05 DIAGNOSIS — L89152 Pressure ulcer of sacral region, stage 2: Secondary | ICD-10-CM | POA: Diagnosis not present

## 2022-04-05 DIAGNOSIS — L308 Other specified dermatitis: Secondary | ICD-10-CM | POA: Diagnosis not present

## 2022-04-05 DIAGNOSIS — I4891 Unspecified atrial fibrillation: Secondary | ICD-10-CM | POA: Diagnosis not present

## 2022-04-06 DIAGNOSIS — I4891 Unspecified atrial fibrillation: Secondary | ICD-10-CM | POA: Diagnosis not present

## 2022-04-06 DIAGNOSIS — R634 Abnormal weight loss: Secondary | ICD-10-CM | POA: Diagnosis not present

## 2022-04-06 DIAGNOSIS — L308 Other specified dermatitis: Secondary | ICD-10-CM | POA: Diagnosis not present

## 2022-04-06 DIAGNOSIS — L89152 Pressure ulcer of sacral region, stage 2: Secondary | ICD-10-CM | POA: Diagnosis not present

## 2022-04-06 DIAGNOSIS — J479 Bronchiectasis, uncomplicated: Secondary | ICD-10-CM | POA: Diagnosis not present

## 2022-04-06 DIAGNOSIS — B379 Candidiasis, unspecified: Secondary | ICD-10-CM | POA: Diagnosis not present

## 2022-04-12 ENCOUNTER — Encounter: Payer: Self-pay | Admitting: Pulmonary Disease

## 2022-04-12 NOTE — Telephone Encounter (Signed)
See mychart.  

## 2022-04-12 DEATH — deceased

## 2022-04-13 NOTE — Telephone Encounter (Signed)
ATC the [employer] [facility] in [place]. LVM asking if they accepted smart vests as a donation and to call back to let us know.

## 2022-04-13 NOTE — Telephone Encounter (Signed)
[name] Noel, MD  [facility]; [place], [place] D, CMA 16 hours ago (5:01 PM)     Spoke to daughter and expressed my condolences.  Please check if there is any organization in [place] that accepts smart vest.  She also has an unopened bottle of Eliquis that can be donated to pharmacy.  If none, then [name] can check in [place] -there is an organization  that accepts your CPAP machine and will probably accept the smart vest    No organizations in [place] that I know of and also checked with Cherina to see if she knew of any in [place] and she also did not know of any. Meagan, can you please check in [place].

## 2022-10-20 ENCOUNTER — Encounter (HOSPITAL_COMMUNITY): Payer: Self-pay | Admitting: *Deleted
